# Patient Record
Sex: Male | Born: 1935 | Hispanic: No | Marital: Married | Smoking: Never smoker
Health system: Southern US, Community
[De-identification: ages and names within clinical notes are randomized; demographics above are authoritative.]

## PROBLEM LIST (undated history)

## (undated) DIAGNOSIS — G20A1 Parkinson's disease without dyskinesia, without mention of fluctuations: Secondary | ICD-10-CM

## (undated) DIAGNOSIS — R413 Other amnesia: Secondary | ICD-10-CM

## (undated) DIAGNOSIS — G20 Parkinson's disease: Secondary | ICD-10-CM

## (undated) DIAGNOSIS — E039 Hypothyroidism, unspecified: Secondary | ICD-10-CM

## (undated) DIAGNOSIS — G4752 REM sleep behavior disorder: Secondary | ICD-10-CM

## (undated) DIAGNOSIS — J189 Pneumonia, unspecified organism: Secondary | ICD-10-CM

## (undated) HISTORY — PX: TONSILLECTOMY: SUR1361

## (undated) HISTORY — DX: Parkinson's disease: G20

## (undated) HISTORY — DX: Other amnesia: R41.3

## (undated) HISTORY — PX: CATARACT EXTRACTION W/ INTRAOCULAR LENS  IMPLANT, BILATERAL: SHX1307

## (undated) HISTORY — DX: REM sleep behavior disorder: G47.52

## (undated) HISTORY — DX: Parkinson's disease without dyskinesia, without mention of fluctuations: G20.A1

## (undated) HISTORY — DX: Hypothyroidism, unspecified: E03.9

---

## 2011-05-28 ENCOUNTER — Other Ambulatory Visit: Payer: Self-pay | Admitting: Neurology

## 2011-05-28 DIAGNOSIS — R269 Unspecified abnormalities of gait and mobility: Secondary | ICD-10-CM

## 2011-05-28 DIAGNOSIS — G20 Parkinson's disease: Secondary | ICD-10-CM

## 2011-05-29 ENCOUNTER — Ambulatory Visit
Admission: RE | Admit: 2011-05-29 | Discharge: 2011-05-29 | Disposition: A | Discharge status: Home / Self Care | Payer: Medicare Other | Source: Ambulatory Visit | Attending: Neurology | Admitting: Neurology

## 2011-05-29 DIAGNOSIS — R269 Unspecified abnormalities of gait and mobility: Secondary | ICD-10-CM

## 2011-05-29 DIAGNOSIS — G20 Parkinson's disease: Secondary | ICD-10-CM

## 2011-06-25 ENCOUNTER — Ambulatory Visit: Payer: Medicare Other | Attending: Neurology | Admitting: Physical Therapy

## 2011-06-25 DIAGNOSIS — IMO0001 Reserved for inherently not codable concepts without codable children: Secondary | ICD-10-CM | POA: Insufficient documentation

## 2011-06-25 DIAGNOSIS — R269 Unspecified abnormalities of gait and mobility: Secondary | ICD-10-CM | POA: Insufficient documentation

## 2011-07-04 ENCOUNTER — Ambulatory Visit: Payer: Medicare Other | Attending: Neurology | Admitting: Physical Therapy

## 2011-07-04 DIAGNOSIS — R269 Unspecified abnormalities of gait and mobility: Secondary | ICD-10-CM | POA: Insufficient documentation

## 2011-07-04 DIAGNOSIS — IMO0001 Reserved for inherently not codable concepts without codable children: Secondary | ICD-10-CM | POA: Insufficient documentation

## 2011-07-10 ENCOUNTER — Ambulatory Visit: Payer: Medicare Other | Admitting: Physical Therapy

## 2011-07-17 ENCOUNTER — Ambulatory Visit: Payer: Medicare Other | Admitting: Physical Therapy

## 2011-07-24 ENCOUNTER — Ambulatory Visit: Payer: Medicare Other | Admitting: Physical Therapy

## 2011-07-30 ENCOUNTER — Ambulatory Visit: Payer: Medicare Other | Admitting: Physical Therapy

## 2011-08-13 ENCOUNTER — Ambulatory Visit: Payer: Medicare Other | Admitting: Physical Therapy

## 2011-08-20 ENCOUNTER — Ambulatory Visit: Payer: Medicare Other | Attending: Neurology | Admitting: Physical Therapy

## 2011-08-20 DIAGNOSIS — R269 Unspecified abnormalities of gait and mobility: Secondary | ICD-10-CM | POA: Insufficient documentation

## 2011-08-20 DIAGNOSIS — IMO0001 Reserved for inherently not codable concepts without codable children: Secondary | ICD-10-CM | POA: Insufficient documentation

## 2011-08-27 ENCOUNTER — Ambulatory Visit: Payer: Medicare Other | Admitting: Physical Therapy

## 2011-09-03 ENCOUNTER — Ambulatory Visit: Payer: Medicare Other | Attending: Neurology | Admitting: Physical Therapy

## 2011-09-03 DIAGNOSIS — IMO0001 Reserved for inherently not codable concepts without codable children: Secondary | ICD-10-CM | POA: Insufficient documentation

## 2011-09-03 DIAGNOSIS — R269 Unspecified abnormalities of gait and mobility: Secondary | ICD-10-CM | POA: Insufficient documentation

## 2012-11-10 DIAGNOSIS — G20 Parkinson's disease: Secondary | ICD-10-CM | POA: Insufficient documentation

## 2012-11-10 DIAGNOSIS — R269 Unspecified abnormalities of gait and mobility: Secondary | ICD-10-CM | POA: Insufficient documentation

## 2013-04-20 ENCOUNTER — Encounter: Payer: Self-pay | Admitting: Neurology

## 2013-04-20 ENCOUNTER — Ambulatory Visit (INDEPENDENT_AMBULATORY_CARE_PROVIDER_SITE_OTHER): Payer: Medicare Other | Admitting: Neurology

## 2013-04-20 VITALS — BP 114/70 | HR 68 | Wt 158.0 lb

## 2013-04-20 DIAGNOSIS — R269 Unspecified abnormalities of gait and mobility: Secondary | ICD-10-CM

## 2013-04-20 DIAGNOSIS — G20 Parkinson's disease: Secondary | ICD-10-CM

## 2013-04-20 MED ORDER — ROPINIROLE HCL 3 MG PO TABS: 3.0000 mg | ORAL_TABLET | Freq: Three times a day (TID) | ORAL | Status: DC

## 2013-04-20 NOTE — Progress Notes (Signed)
   Reason for visit: Parkinson's disease  Craig Giles is an 79 y.o. male  History of present illness:  Craig Giles is an 79 year old right-handed white male with a history of Parkinson's disease. The patient has done well on low-dose Requip, currently taking 3 mg 3 times daily. The patient is tolerating the medication, and he is not having excessive drowsiness, nausea, or confusion on the medication. The patient does report some mild word finding and naming deficits. The patient is remaining active, and he reports no significant balance issues or falls. The patient sleeps well at night without vivid dreams. The patient returns to the office for an evaluation.  Past Medical History  Diagnosis Date  . Parkinson disease   . Hypothyroidism     History reviewed. No pertinent past surgical history.  Family History  Problem Relation Age of Onset  . Diabetes Brother     Social history:  reports that he has never smoked. He does not have any smokeless tobacco history on file. He reports that he does not drink alcohol or use illicit drugs.  Allergies: No Known Allergies  Medications:  No current outpatient prescriptions on file prior to visit.   No current facility-administered medications on file prior to visit.    ROS:  Out of a complete 14 system review of symptoms, the patient complains only of the following symptoms, and all other reviewed systems are negative.  Mild gait instability Mild memory issues  Blood pressure 114/70, pulse 68, weight 158 lb (71.668 kg).  Physical Exam  General: The patient is alert and cooperative at the time of the examination.  Skin: No significant peripheral edema is noted.   Neurologic Exam  Mental status: Mini-Mental status examination done today shows a total score 24/30. The patient is able to name 9 animals in 60 seconds.  Cranial nerves: Facial symmetry is present. Speech is normal, no aphasia or dysarthria is noted.  Extraocular movements are full. Visual fields are full. Mild masking of the face is seen.  Motor: The patient has good strength in all 4 extremities.  Coordination: The patient has good finger-nose-finger and heel-to-shin bilaterally.  Gait and station: The patient has a slightly stooped posture. With ambulation, the patient has decreased arm swing with the right arm, and mild tremor with the right upper extremity. The patient has slight difficulty arising from a chair with arms crossed. Tandem gait is normal. Romberg is negative. No drift is seen.  Reflexes: Deep tendon reflexes are symmetric.   Assessment/Plan:  One. Parkinson's disease   The patient will be followed for the memory issues. The patient is doing well with his mobility, and he will be maintained on the current dose of Requip taking 3 mg 3 times daily. The patient will followup in about 5 or 6 months. The patient will contact me if he has any change in his functional level.  Marlan Palau MD 04/20/2013 1:41 PM  [facility] [address] [place], [place] [address]  Phone [phone] Fax [phone]

## 2013-07-14 ENCOUNTER — Other Ambulatory Visit: Payer: Self-pay

## 2013-07-14 MED ORDER — ROPINIROLE HCL 3 MG PO TABS: 3.0000 mg | ORAL_TABLET | Freq: Three times a day (TID) | ORAL | Status: DC

## 2013-10-13 ENCOUNTER — Other Ambulatory Visit: Payer: Self-pay | Admitting: Neurology

## 2013-10-22 ENCOUNTER — Encounter (INDEPENDENT_AMBULATORY_CARE_PROVIDER_SITE_OTHER): Payer: Self-pay

## 2013-10-22 ENCOUNTER — Ambulatory Visit (INDEPENDENT_AMBULATORY_CARE_PROVIDER_SITE_OTHER): Payer: Medicare Other | Admitting: Neurology

## 2013-10-22 ENCOUNTER — Encounter: Payer: Self-pay | Admitting: Neurology

## 2013-10-22 VITALS — BP 115/70 | HR 60 | Wt 165.0 lb

## 2013-10-22 DIAGNOSIS — R413 Other amnesia: Secondary | ICD-10-CM | POA: Insufficient documentation

## 2013-10-22 DIAGNOSIS — G20 Parkinson's disease: Secondary | ICD-10-CM

## 2013-10-22 DIAGNOSIS — R269 Unspecified abnormalities of gait and mobility: Secondary | ICD-10-CM

## 2013-10-22 HISTORY — DX: Other amnesia: R41.3

## 2013-10-22 MED ORDER — CARBIDOPA-LEVODOPA 25-100 MG PO TABS: ORAL_TABLET | ORAL | Status: DC

## 2013-10-22 NOTE — Patient Instructions (Signed)

## 2013-10-22 NOTE — Progress Notes (Signed)
Reason for visit: Parkinson's disease  Craig Giles is an 79 y.o. male  History of present illness:   Craig Giles is an 79 year old right-handed white male with a history of Parkinson's disease. The patient has been on low-dose Requip taking 3 mg 3 times daily. Since last seen, the patient has had a gradual worsening of his condition, with increased bradykinesia. The patient is freezing sometimes when he tries to initiate walking. The patient is having some issues with a mild memory disturbance. The patient wakes up at night to use the bathroom, and he oftentimes cannot get back to sleep. The patient does report vivid dreams at night, but the wife indicates that he does not act out his dreams. The patient is not extremely active during the day. The patient has difficulty getting up out of a chair or a couch. The patient denies any falls. The patient denies any problems with swallowing or chewing. The patient returns to this office for an evaluation.  Past Medical History  Diagnosis Date  . Parkinson disease   . Hypothyroidism   . Memory deficits 10/22/2013    History reviewed. No pertinent past surgical history.  Family History  Problem Relation Age of Onset  . Diabetes Brother     Social history:  reports that he has never smoked. He has never used smokeless tobacco. He reports that he does not drink alcohol or use illicit drugs.   No Known Allergies  Medications:  Current Outpatient Prescriptions on File Prior to Visit  Medication Sig Dispense Refill  . levothyroxine (SYNTHROID, LEVOTHROID) 88 MCG tablet Take 88 mcg by mouth daily.      . Multiple Vitamin (MULTIVITAMIN) tablet Take 1 tablet by mouth daily.      Marland Kitchen rOPINIRole (REQUIP) 3 MG tablet TAKE 1 TABLET 3 TIMES A DAY  270 tablet  3   No current facility-administered medications on file prior to visit.    ROS:  Out of a complete 14 system review of symptoms, the patient complains only of the following  symptoms, and all other reviewed systems are negative.  Memory disturbance Tremor Gait disturbance  Blood pressure 115/70, pulse 60, weight 165 lb (74.844 kg).  Physical Exam  General: The patient is alert and cooperative at the time of the examination.  Skin: 2-3+ edema below the knees is noted bilaterally.   Neurologic Exam  Mental status: The Mini-Mental status examination done today shows a total score of 28/30.  Cranial nerves: Facial symmetry is present. Speech is normal, no aphasia or dysarthria is noted. Extraocular movements are full. Visual fields are full.  Motor: The patient has good strength in all 4 extremities.  Sensory examination: Soft touch sensation is symmetric on the face, arms, and legs.  Coordination: The patient has good finger-nose-finger and heel-to-shin bilaterally.  Gait and station: The patient is able to arise from a seated position with arms crossed. Once up, the patient has a stooped posture, decreased arm swing, right greater than left. Some intermittent tremor with the right arm is noted at rest. Tandem gait is slightly unsteady. Romberg is negative. No drift is seen.  Reflexes: Deep tendon reflexes are symmetric.   Assessment/Plan:  One. Parkinson's disease  2. Memory disturbance  The patient is progressing some with the Parkinson's disease. The patient will be placed on low-dose Sinemet taking the 25/100 mg tablets, one half tablet 3 times daily for 3 weeks, and then go to one half tablet 3 times daily. If the patient  does well with this, he will call for a 90 day prescription. The patient will remain on Requip taking 3 mg 3 times daily. The patient will followup through this office in 5 months. The patient is to remain as active as possible, walking on a regular basis.  Marlan Palau MD 10/22/2013 8:09 PM  [facility] [address] [place], [place] [address]  Phone [phone] Fax [phone]

## 2014-04-27 ENCOUNTER — Ambulatory Visit (INDEPENDENT_AMBULATORY_CARE_PROVIDER_SITE_OTHER): Payer: Medicare Other | Admitting: Neurology

## 2014-04-27 ENCOUNTER — Encounter: Payer: Self-pay | Admitting: Neurology

## 2014-04-27 ENCOUNTER — Encounter (INDEPENDENT_AMBULATORY_CARE_PROVIDER_SITE_OTHER): Payer: Self-pay

## 2014-04-27 VITALS — BP 93/61 | HR 56 | Wt 169.0 lb

## 2014-04-27 DIAGNOSIS — R413 Other amnesia: Secondary | ICD-10-CM

## 2014-04-27 DIAGNOSIS — G20 Parkinson's disease: Secondary | ICD-10-CM

## 2014-04-27 DIAGNOSIS — G20A1 Parkinson's disease without dyskinesia, without mention of fluctuations: Secondary | ICD-10-CM

## 2014-04-27 DIAGNOSIS — R269 Unspecified abnormalities of gait and mobility: Secondary | ICD-10-CM

## 2014-04-27 MED ORDER — CARBIDOPA-LEVODOPA 25-100 MG PO TABS: 1.0000 | ORAL_TABLET | Freq: Three times a day (TID) | ORAL | Status: DC

## 2014-04-27 NOTE — Patient Instructions (Signed)

## 2014-04-27 NOTE — Progress Notes (Signed)
Reason for visit: Parkinson's disease  Craig Giles is an 80 y.o. male  History of present illness:  Craig Giles is an 80 year old right-handed white male with a history of Parkinson's disease. The patient has been placed on Sinemet taking the 25/100 mg tablets 3 times daily. The patient is on Requip taking 3 mg 3 times daily. The patient denies any drowsiness or confusion on the medication. The patient indicates that he feels that he got no therapy benefit from this medication. He does have some hesitancy when initiating ambulation, but he denies any gait instability or falls. The patient denies any problems choking. He averages about 4 hours of sleep at night, but he indicates that this is enough not sleep for him, he does not have excessive fatigue or drowsiness during the day.  Past Medical History  Diagnosis Date  . Parkinson disease   . Hypothyroidism   . Memory deficits 10/22/2013    History reviewed. No pertinent past surgical history.  Family History  Problem Relation Age of Onset  . Diabetes Brother     Social history:  reports that he has never smoked. He has never used smokeless tobacco. He reports that he does not drink alcohol or use illicit drugs.   No Known Allergies  Medications:  Current Outpatient Prescriptions on File Prior to Visit  Medication Sig Dispense Refill  . furosemide (LASIX) 20 MG tablet Take 20 mg by mouth daily. For swelling      . levothyroxine (SYNTHROID, LEVOTHROID) 88 MCG tablet Take 88 mcg by mouth daily.      . Multiple Vitamin (MULTIVITAMIN) tablet Take 1 tablet by mouth daily.      Marland Kitchen rOPINIRole (REQUIP) 3 MG tablet TAKE 1 TABLET 3 TIMES A DAY  270 tablet  3   No current facility-administered medications on file prior to visit.    ROS:  Out of a complete 14 system review of symptoms, the patient complains only of the following symptoms, and all other reviewed systems are negative.  Slowness of activities  Blood pressure  93/61, pulse 56, weight 169 lb (76.658 kg).  Physical Exam  General: The patient is alert and cooperative at the time of the examination.  Skin: 2+ edema below the knees is noted bilaterally.   Neurologic Exam  Mental status: The patient is oriented x 3. Mini-Mental status examination done today shows a total score of 30/30.  Cranial nerves: Facial symmetry is present. Speech is normal, no aphasia or dysarthria is noted. Extraocular movements are full. Visual fields are full.  Motor: The patient has good strength in all 4 extremities.  Sensory examination: Soft touch sensation is symmetric on the face, arms, and legs.  Coordination: The patient has good finger-nose-finger and heel-to-shin bilaterally.  Gait and station: The patient is able to arise from a seated position with arms crossed. Once up, the patient and is without assistance, has good arm swing, slightly stooped posture. The patient has some shuffling with turns. Tandem gait is slightly unsteady. Romberg is negative. No drift is seen.  Reflexes: Deep tendon reflexes are symmetric.   Assessment/Plan:  One. Parkinson's disease  The patient is doing relatively well at this point, but he does have some hesitancy with turns and with initiation of ambulation. The patient will go up on the Sinemet taking the 25/100 mg tablets 3 times daily. The patient will followup through this office in about 5 months.  Marlan Palau MD 04/27/2014 5:02 PM  [facility]  [address] [place], [place] [address]  Phone [phone] Fax [phone]

## 2014-10-03 ENCOUNTER — Encounter: Payer: Self-pay | Admitting: Neurology

## 2014-10-03 ENCOUNTER — Ambulatory Visit (INDEPENDENT_AMBULATORY_CARE_PROVIDER_SITE_OTHER): Payer: Medicare Other | Admitting: Neurology

## 2014-10-03 VITALS — BP 117/73 | HR 60 | Ht 62.0 in | Wt 169.4 lb

## 2014-10-03 DIAGNOSIS — G20 Parkinson's disease: Secondary | ICD-10-CM

## 2014-10-03 DIAGNOSIS — R413 Other amnesia: Secondary | ICD-10-CM

## 2014-10-03 NOTE — Patient Instructions (Signed)
Parkinson Disease Parkinson disease is a disorder of the central nervous system, which includes the brain and spinal cord. A person with this disease slowly loses the ability to completely control body movements. Within the brain, there is a group of nerve cells (basal ganglia) that help control movement. The basal ganglia are damaged and do not work properly in a person with Parkinson disease. In addition, the basal ganglia produce and use a brain chemical called dopamine. The dopamine chemical sends messages to other parts of the body to control and coordinate body movements. Dopamine levels are low in a person with Parkinson disease. If the dopamine levels are low, then the body does not receive the correct messages it needs to move normally.  CAUSES  The exact reason why the basal ganglia get damaged is not known. Some medical researchers have thought that infection, genes, environment, and certain medicines may contribute to the cause.  SYMPTOMS   An early symptom of Parkinson disease is often an uncontrolled shaking (tremor) of the hands. The tremor will often disappear when the affected hand is consciously used.  As the disease progresses, walking, talking, getting out of a chair, and new movements become more difficult.  Muscles get stiff and movements become slower.  Balance and coordination become harder.  Depression, trouble swallowing, urinary problems, constipation, and sleep problems can occur.  Later in the disease, memory and thought processes may deteriorate. DIAGNOSIS  There are no specific tests to diagnose Parkinson disease. You may be referred to a neurologist for evaluation. Your caregiver will ask about your medical history, symptoms, and perform a physical exam. Blood tests and imaging tests of your brain may be performed to rule out other diseases. The imaging tests may include an MRI or a CT scan. TREATMENT  The goal of treatment is to relieve symptoms. Medicines may be  prescribed once the symptoms become troublesome. Medicine will not stop the progression of the disease, but medicine can make movement and balance better and help control tremors. Speech and occupational therapy may also be prescribed. Sometimes, surgical treatment of the brain can be done in young people. HOME CARE INSTRUCTIONS  Get regular exercise and rest periods during the day to help prevent exhaustion and depression.  If getting dressed becomes difficult, replace buttons and zippers with Velcro and elastic on your clothing.  Take all medicine as directed by your caregiver.  Install grab bars or railings in your home to prevent falls.  Go to speech or occupational therapy as directed.  Keep all follow-up visits as directed by your caregiver. SEEK MEDICAL CARE IF:  Your symptoms are not controlled with your medicine.  You fall.  You have trouble swallowing or choke on your food. MAKE SURE YOU:  Understand these instructions.  Will watch your condition.  Will get help right away if you are not doing well or get worse. Document Released: 11/15/2000 Document Revised: 03/15/2013 Document Reviewed: 12/18/2011 Exit[employer] Patient Information 2015 [employer]. This information is not intended to replace advice given to you by your health care provider. Make sure you discuss any questions you have with your health care provider.  

## 2014-10-03 NOTE — Progress Notes (Addendum)
Reason for visit: Parkinson's disease  Craig Giles is an 80 y.o. male  History of present illness:  [name] is an 80 year old right-handed white male with a history of Parkinson's disease and a mild memory disturbance. The patient has done very well since last seen, he continues to remain active, and he has not had any decline in his ambulatory ability since last seen. The patient denies any falls, he denies any tremor, and he denies any problems with swallowing. The patient indicates that he only will sleep well at night, but he oftentimes has to get up to use the bathroom at one point, and he has difficulty falling back asleep commonly. The patient is on Sinemet taking the 25/100 mg tablets 3 times daily, and he is tolerating the medication well. He returns to the office for an evaluation.  Past Medical History  Diagnosis Date  . Parkinson disease   . Hypothyroidism   . Memory deficits 10/22/2013    History reviewed. No pertinent past surgical history.  Family History  Problem Relation Age of Onset  . Diabetes Brother     Social history:  reports that he has never smoked. He has never used smokeless tobacco. He reports that he does not drink alcohol or use illicit drugs.   No Known Allergies  Medications:  Current Outpatient Prescriptions on File Prior to Visit  Medication Sig Dispense Refill  . carbidopa-levodopa (SINEMET IR) 25-100 MG per tablet Take 1 tablet by mouth 3 (three) times daily. 270 tablet 3  . levothyroxine (SYNTHROID, LEVOTHROID) 88 MCG tablet Take 88 mcg by mouth daily.    . Multiple Vitamin (MULTIVITAMIN) tablet Take 1 tablet by mouth daily.    Marland Kitchen. rOPINIRole (REQUIP) 3 MG tablet TAKE 1 TABLET 3 TIMES A DAY 270 tablet 3   No current facility-administered medications on file prior to visit.    ROS:  Out of a complete 14 system review of symptoms, the patient complains only of the following symptoms, and all other reviewed systems are  negative.  Insomnia   Blood pressure 117/73, pulse 60, height 5\' 2"  (1.575 m), weight 169 lb 6.4 oz (76.839 kg).  Physical Exam  General: The patient is alert and cooperative at the time of the examination.  Neuromuscular: The patient's posture is somewhat stooped.  Skin: 1+ edema is noted in both legs below the knees.   Neurologic Exam  Mental status: The Mini-Mental status examination done today shows a total score of 28/30.  Cranial nerves: Facial symmetry is present. Speech is normal, no aphasia or dysarthria is noted. Extraocular movements are full. Visual fields are full.  Motor: The patient has good strength in all 4 extremities.  Sensory examination: Soft touch sensation is symmetric on the face, arms, and legs.  Coordination: The patient has good finger-nose-finger and heel-to-shin bilaterally.  Gait and station: The patient is able to arise from a seated position with arms crossed. Once up, the patient has good stride, returns.. Tandem gait is slightly unsteady. Romberg is negative. No drift is seen.  Reflexes: Deep tendon reflexes are symmetric.   Assessment/Plan:  1. Parkinson's disease  2. Mild memory disturbance  The patient is overall doing very well, he is staying active, and he is maintaining his functional ability quite well. The patient will need be followed for the memory issue, and medications for memory may be added in the near future. He will follow-up in about 5 months.  Craig Giles. Keith Neiva Maenza MD 10/03/2014 12:05 PM  [facility] [address] [place], [place] [address]  Phone [phone] Fax [phone]

## 2014-10-07 ENCOUNTER — Other Ambulatory Visit: Payer: Self-pay | Admitting: Neurology

## 2014-10-19 ENCOUNTER — Encounter: Payer: Self-pay | Admitting: Neurology

## 2014-10-25 ENCOUNTER — Encounter: Payer: Self-pay | Admitting: Neurology

## 2015-03-06 ENCOUNTER — Ambulatory Visit (INDEPENDENT_AMBULATORY_CARE_PROVIDER_SITE_OTHER): Payer: Medicare Other | Admitting: Neurology

## 2015-03-06 ENCOUNTER — Encounter: Payer: Self-pay | Admitting: Neurology

## 2015-03-06 VITALS — BP 104/66 | HR 56 | Ht 66.0 in | Wt 169.0 lb

## 2015-03-06 DIAGNOSIS — R413 Other amnesia: Secondary | ICD-10-CM

## 2015-03-06 DIAGNOSIS — G20 Parkinson's disease: Secondary | ICD-10-CM

## 2015-03-06 DIAGNOSIS — R269 Unspecified abnormalities of gait and mobility: Secondary | ICD-10-CM

## 2015-03-06 MED ORDER — CARBIDOPA-LEVODOPA 25-100 MG PO TABS: 1.0000 | ORAL_TABLET | Freq: Three times a day (TID) | ORAL | Status: DC

## 2015-03-06 NOTE — Progress Notes (Signed)
Reason for visit: Parkinson's disease  Craig Giles is an 81 y.o. male  History of present illness:  [name] is an 81 year old right-handed white male with a history of Parkinson's disease. The patient was initially seen in June 2012 with a resting tremor affecting the right upper extremity, and masking of the face. The patient has been placed on low-dose Requip and he is on Sinemet. The patient believes that these medications helped his symptoms. He has not had any tremor. He continues to be active, he has not had any problems with walking or getting around. He denies any falls, he denies problems with speech or swallowing. He has not had any significant changes in his physical abilities since last seen, and he is doing well cognitively. He returns for an evaluation. He operates motor vehicle without difficulty.  Past Medical History  Diagnosis Date  . Parkinson disease   . Hypothyroidism   . Memory deficits 10/22/2013    Past Surgical History  Procedure Laterality Date  . Tonsillectomy      Family History  Problem Relation Age of Onset  . Diabetes Brother     Social history:  reports that he has never smoked. He has never used smokeless tobacco. He reports that he does not drink alcohol or use illicit drugs.   No Known Allergies  Medications:  Prior to Admission medications   Medication Sig Start Date End Date Taking? Authorizing Provider  carbidopa-levodopa (SINEMET IR) 25-100 MG per tablet Take 1 tablet by mouth 3 (three) times daily. 04/27/14  Yes York Spanielharles K Kipton Skillen, MD  levothyroxine (SYNTHROID, LEVOTHROID) 88 MCG tablet Take 88 mcg by mouth daily. 03/17/13  Yes Historical Provider, MD  Multiple Vitamin (MULTIVITAMIN) tablet Take 1 tablet by mouth daily.   Yes Historical Provider, MD  rOPINIRole (REQUIP) 3 MG tablet TAKE 1 TABLET BY MOUTH THREE TIMES DAILY 10/07/14  Yes York Spanielharles K Apollos Tenbrink, MD    ROS:  Out of a complete 14 system review of symptoms, the patient  complains only of the following symptoms, and all other reviewed systems are negative.  Mild memory disorder  Blood pressure 104/66, pulse 56, height 5\' 6"  (1.676 m), weight 169 lb (76.658 kg).  Physical Exam  General: The patient is alert and cooperative at the time of the examination.  Skin: 1+ edema below the knees is noted bilaterally.   Neurologic Exam  Mental status: The patient is alert and oriented x 3 at the time of the examination. The patient has apparent normal recent and remote memory, with an apparently normal attention span and concentration ability. Mini-Mental Status Examination done today shows a total score 30/30.   Cranial nerves: Facial symmetry is present. Speech is normal, no aphasia or dysarthria is noted. Extraocular movements are full. Visual fields are full.  Motor: The patient has good strength in all 4 extremities.  Sensory examination: Soft touch sensation is symmetric on the face, arms, and legs.  Coordination: The patient has good finger-nose-finger and heel-to-shin bilaterally.  Gait and station: The patient has a normal gait. The patient has good arm swing, kyphotic posture. The patient is able to arise from a seated position with arms crossed. Tandem gait is normal. Romberg is negative. No drift is seen.  Reflexes: Deep tendon reflexes are symmetric.   Assessment/Plan:  1. Parkinson's disease  2. Mild memory disorder  The patient is doing quite well currently. We will continue the medications for now, a prescription was called in for Sinemet. He will  follow-up in 6 months.  Marlan Palau MD 03/06/2015 7:30 PM  [facility] [address] [place], [place] [address]  Phone [phone] Fax [phone]

## 2015-04-25 ENCOUNTER — Other Ambulatory Visit: Payer: Self-pay | Admitting: Neurology

## 2015-09-11 ENCOUNTER — Ambulatory Visit (INDEPENDENT_AMBULATORY_CARE_PROVIDER_SITE_OTHER): Payer: Medicare Other | Admitting: Neurology

## 2015-09-11 ENCOUNTER — Encounter: Payer: Self-pay | Admitting: Neurology

## 2015-09-11 VITALS — BP 125/76 | HR 56 | Ht 65.0 in | Wt 164.0 lb

## 2015-09-11 DIAGNOSIS — R413 Other amnesia: Secondary | ICD-10-CM | POA: Diagnosis not present

## 2015-09-11 DIAGNOSIS — R269 Unspecified abnormalities of gait and mobility: Secondary | ICD-10-CM

## 2015-09-11 DIAGNOSIS — G20 Parkinson's disease: Secondary | ICD-10-CM

## 2015-09-11 MED ORDER — ROPINIROLE HCL 3 MG PO TABS: 3.0000 mg | ORAL_TABLET | Freq: Three times a day (TID) | ORAL | Status: DC

## 2015-09-11 NOTE — Patient Instructions (Signed)
Parkinson Disease Parkinson disease is a disorder of the central nervous system, which includes the brain and spinal cord. A person with this disease slowly loses the ability to completely control body movements. Within the brain, there is a group of nerve cells (basal ganglia) that help control movement. The basal ganglia are damaged and do not work properly in a person with Parkinson disease. In addition, the basal ganglia produce and use a brain chemical called dopamine. The dopamine chemical sends messages to other parts of the body to control and coordinate body movements. Dopamine levels are low in a person with Parkinson disease. If the dopamine levels are low, then the body does not receive the correct messages it needs to move normally.  CAUSES  The exact reason why the basal ganglia get damaged is not known. Some medical researchers have thought that infection, genes, environment, and certain medicines may contribute to the cause.  SYMPTOMS   An early symptom of Parkinson disease is often an uncontrolled shaking (tremor) of the hands. The tremor will often disappear when the affected hand is consciously used.  As the disease progresses, walking, talking, getting out of a chair, and new movements become more difficult.  Muscles get stiff and movements become slower.  Balance and coordination become harder.  Depression, trouble swallowing, urinary problems, constipation, and sleep problems can occur.  Later in the disease, memory and thought processes may deteriorate. DIAGNOSIS  There are no specific tests to diagnose Parkinson disease. You may be referred to a neurologist for evaluation. Your caregiver will ask about your medical history, symptoms, and perform a physical exam. Blood tests and imaging tests of your brain may be performed to rule out other diseases. The imaging tests may include an MRI or a CT scan. TREATMENT  The goal of treatment is to relieve symptoms. Medicines may be  prescribed once the symptoms become troublesome. Medicine will not stop the progression of the disease, but medicine can make movement and balance better and help control tremors. Speech and occupational therapy may also be prescribed. Sometimes, surgical treatment of the brain can be done in young people. HOME CARE INSTRUCTIONS  Get regular exercise and rest periods during the day to help prevent exhaustion and depression.  If getting dressed becomes difficult, replace buttons and zippers with Velcro and elastic on your clothing.  Take all medicine as directed by your caregiver.  Install grab bars or railings in your home to prevent falls.  Go to speech or occupational therapy as directed.  Keep all follow-up visits as directed by your caregiver. SEEK MEDICAL CARE IF:  Your symptoms are not controlled with your medicine.  You fall.  You have trouble swallowing or choke on your food. MAKE SURE YOU:  Understand these instructions.  Will watch your condition.  Will get help right away if you are not doing well or get worse.   This information is not intended to replace advice given to you by your health care provider. Make sure you discuss any questions you have with your health care provider.   Document Released: 11/15/2000 Document Revised: 03/15/2013 Document Reviewed: 12/18/2011 Elsevier Interactive Patient Education 2016 Elsevier Inc.  

## 2015-09-11 NOTE — Progress Notes (Signed)
Reason for visit: Parkinson's disease  Craig Giles is an 81 y.o. male  History of present illness:  Craig Giles is an 81 year old right-handed white male with a history of Parkinson's disease. The patient has had no significant changes in his mobility since last seen, he will note on occasion that he will have some hesitancy with initiation of ambulation first thing in the morning. The patient has not had any falls because of this. He remains active. He denies any significant issues with memory, he will occasionally have some difficulty remembering names for people. He is on Sinemet and Requip. He indicates that he gets to sleep fairly well, but he oftentimes will wake up early in the morning, and have difficulty getting back to sleep. He will need to read in order to get back to sleep. He returns for an evaluation.  Past Medical History  Diagnosis Date  . Parkinson disease (HCC)   . Hypothyroidism   . Memory deficits 10/22/2013    Past Surgical History  Procedure Laterality Date  . Tonsillectomy      Family History  Problem Relation Age of Onset  . Diabetes Brother     Social history:  reports that he has never smoked. He has never used smokeless tobacco. He reports that he does not drink alcohol or use illicit drugs.   No Known Allergies  Medications:  Prior to Admission medications   Medication Sig Start Date End Date Taking? Authorizing Provider  levothyroxine (SYNTHROID, LEVOTHROID) 88 MCG tablet Take 88 mcg by mouth daily. 03/17/13  Yes Historical Provider, MD  Multiple Vitamin (MULTIVITAMIN) tablet Take 1 tablet by mouth daily.   Yes Historical Provider, MD  rOPINIRole (REQUIP) 3 MG tablet TAKE 1 TABLET BY MOUTH THREE TIMES DAILY 10/07/14  Yes York Spaniel, MD  carbidopa-levodopa (SINEMET IR) 25-100 MG per tablet Take 1 tablet by mouth 3 (three) times daily. Patient not taking: Reported on 09/11/2015 03/06/15   York Spaniel, MD    ROS:  Out of a  complete 14 system review of symptoms, the patient complains only of the following symptoms, and all other reviewed systems are negative.  Insomnia  Blood pressure 125/76, pulse 56, height  (1.651 m), weight 164 lb (74.39 kg).  Physical Exam  General: The patient is alert and cooperative at the time of the examination. Posture is somewhat stooped.  Skin: 1+ edema to ankles is noted bilaterally. The patient has varicose veins on the left greater than right lower extremity.   Neurologic Exam  Mental status: The patient is alert and oriented x 3 at the time of the examination. The patient has apparent normal recent and remote memory, with an apparently normal attention span and concentration ability. Mini-Mental Status Examination done today shows a total score of 30/30. The patient is able to name 8 animals in 30 seconds.   Cranial nerves: Facial symmetry is present. Speech is normal, no aphasia or dysarthria is noted. Extraocular movements are full. Visual fields are full.  Motor: The patient has good strength in all 4 extremities.  Sensory examination: Soft touch sensation is symmetric on the face, arms, and legs.  Coordination: The patient has good finger-nose-finger and heel-to-shin bilaterally.  Gait and station: The patient is able to rise from a seated position with arms crossed. Once up, the patient is able to ambulate independently, good arm swing and good turns. Tandem gait is slightly unsteady. Romberg is negative. No drift is seen.  Reflexes: Deep tendon  reflexes are symmetric.   Assessment/Plan:  1. Parkinson's disease  2. Mild memory disturbance  The patient is doing quite well at this time. He is to remain active. We will continue to follow the memory. He will remain on the low-dose Sinemet and Requip at the current dose, a prescription was given for the Requip. He will follow-up in 6 months.  Marlan Palau MD 09/11/2015 9:40 AM  [facility] [address] [place], [place] [address]  Phone [phone] Fax [phone]

## 2015-10-09 ENCOUNTER — Telehealth: Payer: Self-pay | Admitting: Neurology

## 2015-10-09 NOTE — Telephone Encounter (Signed)
Craig Giles/Dr Mardelle MatteAndy [phone] called to schedule appointment within 1-2 weeks for delirium, progressive Parkinson's, poor sleep.

## 2015-10-10 NOTE — Telephone Encounter (Signed)
Returned call. Office phone number busy.

## 2015-10-10 NOTE — Telephone Encounter (Signed)
Per Tad Mooreasandra -ok to schedule pt

## 2015-10-10 NOTE — Telephone Encounter (Signed)
Thanks

## 2015-10-17 ENCOUNTER — Ambulatory Visit: Payer: Medicare Other | Admitting: Neurology

## 2015-12-15 ENCOUNTER — Telehealth: Payer: Self-pay | Admitting: Neurology

## 2015-12-15 NOTE — Telephone Encounter (Signed)
I called patient. The patient has had a sudden decline in his ability to function, he is freezing. He indicates that he has not been sick, his tablets have not changed in color or shape. I will try to work in revisit, he will go up on the Sinemet taking 1.5 tablets 3 times daily.

## 2015-12-15 NOTE — Telephone Encounter (Signed)
Pt called said he is suddenly doing very badly. He gets stuck walking, afraid of falling but is moving slowly and being very careful. He said this has been going on for the past 5 or 6 days.

## 2015-12-15 NOTE — Telephone Encounter (Signed)
I called the patient. He is still taking his Sinemet and Requip as prescribed. I advised that I would let [name] know about his new symptoms.

## 2015-12-18 NOTE — Telephone Encounter (Signed)
Spoke to patient's wife. Appointment scheduled 1/19.

## 2015-12-21 ENCOUNTER — Encounter: Payer: Self-pay | Admitting: Neurology

## 2015-12-21 ENCOUNTER — Ambulatory Visit (INDEPENDENT_AMBULATORY_CARE_PROVIDER_SITE_OTHER): Payer: Medicare Other | Admitting: Neurology

## 2015-12-21 VITALS — BP 99/52 | HR 55 | Ht 65.0 in | Wt 164.0 lb

## 2015-12-21 DIAGNOSIS — R413 Other amnesia: Secondary | ICD-10-CM

## 2015-12-21 DIAGNOSIS — R269 Unspecified abnormalities of gait and mobility: Secondary | ICD-10-CM

## 2015-12-21 DIAGNOSIS — G20 Parkinson's disease: Secondary | ICD-10-CM

## 2015-12-21 DIAGNOSIS — G4752 REM sleep behavior disorder: Secondary | ICD-10-CM | POA: Diagnosis not present

## 2015-12-21 HISTORY — DX: REM sleep behavior disorder: G47.52

## 2015-12-21 MED ORDER — ROPINIROLE HCL 4 MG PO TABS: 4.0000 mg | ORAL_TABLET | Freq: Three times a day (TID) | ORAL | Status: DC

## 2015-12-21 NOTE — Patient Instructions (Addendum)
We will go up on the ropinerole to 4 mg three times a day.  Fall Prevention in the Home  Falls can cause injuries and can affect people from all age groups. There are many simple things that you can do to make your home safe and to help prevent falls. WHAT CAN I DO ON THE OUTSIDE OF MY HOME?  Regularly repair the edges of walkways and driveways and fix any cracks.  Remove high doorway thresholds.  Trim any shrubbery on the main path into your home.  Use bright outdoor lighting.  Clear walkways of debris and clutter, including tools and rocks.  Regularly check that handrails are securely fastened and in good repair. Both sides of any steps should have handrails.  Install guardrails along the edges of any raised decks or porches.  Have leaves, snow, and ice cleared regularly.  Use sand or salt on walkways during winter months.  In the garage, clean up any spills right away, including grease or oil spills. WHAT CAN I DO IN THE BATHROOM?  Use night lights.  Install grab bars by the toilet and in the tub and shower. Do not use towel bars as grab bars.  Use non-skid mats or decals on the floor of the tub or shower.  If you need to sit down while you are in the shower, use a plastic, non-slip stool.Marland Kitchen  Keep the floor dry. Immediately clean up any water that spills on the floor.  Remove soap buildup in the tub or shower on a regular basis.  Attach bath mats securely with double-sided non-slip rug tape.  Remove throw rugs and other tripping hazards from the floor. WHAT CAN I DO IN THE BEDROOM?  Use night lights.  Make sure that a bedside light is easy to reach.  Do not use oversized bedding that drapes onto the floor.  Have a firm chair that has side arms to use for getting dressed.  Remove throw rugs and other tripping hazards from the floor. WHAT CAN I DO IN THE KITCHEN?   Clean up any spills right away.  Avoid walking on wet floors.  Place frequently used items  in easy-to-reach places.  If you need to reach for something above you, use a sturdy step stool that has a grab bar.  Keep electrical cables out of the way.  Do not use floor polish or wax that makes floors slippery. If you have to use wax, make sure that it is non-skid floor wax.  Remove throw rugs and other tripping hazards from the floor. WHAT CAN I DO IN THE STAIRWAYS?  Do not leave any items on the stairs.  Make sure that there are handrails on both sides of the stairs. Fix handrails that are broken or loose. Make sure that handrails are as long as the stairways.  Check any carpeting to make sure that it is firmly attached to the stairs. Fix any carpet that is loose or worn.  Avoid having throw rugs at the top or bottom of stairways, or secure the rugs with carpet tape to prevent them from moving.  Make sure that you have a light switch at the top of the stairs and the bottom of the stairs. If you do not have them, have them installed. WHAT ARE SOME OTHER FALL PREVENTION TIPS?  Wear closed-toe shoes that fit well and support your feet. Wear shoes that have rubber soles or low heels.  When you use a stepladder, make sure that it is  completely opened and that the sides are firmly locked. Have someone hold the ladder while you are using it. Do not climb a closed stepladder.  Add color or contrast paint or tape to grab bars and handrails in your home. Place contrasting color strips on the first and last steps.  Use mobility aids as needed, such as canes, walkers, scooters, and crutches.  Turn on lights if it is dark. Replace any light bulbs that burn out.  Set up furniture so that there are clear paths. Keep the furniture in the same spot.  Fix any uneven floor surfaces.  Choose a carpet design that does not hide the edge of steps of a stairway.  Be aware of any and all pets.  Review your medicines with your healthcare provider. Some medicines can cause dizziness or changes  in blood pressure, which increase your risk of falling. Talk with your health care provider about other ways that you can decrease your risk of falls. This may include working with a physical therapist or trainer to improve your strength, balance, and endurance.   This information is not intended to replace advice given to you by your health care provider. Make sure you discuss any questions you have with your health care provider.   Document Released: 11/08/2002 Document Revised: 04/04/2015 Document Reviewed: 12/23/2014 Elsevier Interactive Patient Education [employer]

## 2015-12-21 NOTE — Progress Notes (Signed)
Reason for visit:  Parkinson's disease  Craig Giles is an 82 y.o. male  History of present illness:   Craig Giles is an 82 year old right-handed white male with a history of Parkinson's disease. Within the last 2 or 3 weeks, he has noted a change in his ability to ambulate. He has had a relatively sudden decline in his gait, freezing up frequently. He has stumbles, but no falls. He denies any change in color of any of his tablets. The patient is on Requip taking 3 mg 3 times daily, and Sinemet 25/100 mg tablets 3 times daily. The patient is told to go up on his Sinemet taking 1.5 tablets 3 times daily, in reality what he did was went up on the Requip taking 1.5 tablets 3 times daily. However, the patient seemed to gain benefit with this, he is walking better, moving better. He does report some issues at nighttime with vivid dreams, and with acting out dreams. He occasionally may fall out of bed. The patient returns to the office today for an evaluation. He denies any significant memory issues at this time.  Past Medical History  Diagnosis Date  . Parkinson disease (HCC)   . Hypothyroidism   . Memory deficits 10/22/2013  . 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 Tonsillectomy      Family History  Problem Relation Age of Onset  . Diabetes Brother     Social history:  reports that he has never smoked. He has never used smokeless tobacco. He reports that he does not drink alcohol or use illicit drugs.   No Known Allergies  Medications:  Prior to Admission medications   Medication Sig Start Date End Date Taking? Authorizing Provider  carbidopa-levodopa (SINEMET IR) 25-100 MG per tablet Take 1 tablet by mouth 3 (three) times daily. 03/06/15  Yes York Spaniel, MD  levothyroxine (SYNTHROID, LEVOTHROID) 88 MCG tablet Take 88 mcg by mouth daily. 03/17/13  Yes Historical Provider, MD  Multiple Vitamin (MULTIVITAMIN) tablet  Take 1 tablet by mouth daily.   Yes Historical Provider, MD  rOPINIRole (REQUIP) 3 MG tablet Take 1 tablet (3 mg total) by mouth 3 (three) times daily. 09/11/15  Yes York Spaniel, MD    ROS:  Out of a complete 14 system review of symptoms, the patient complains only of the following symptoms, and all other reviewed systems are negative.   Gait disorder  Vivid dreams  Blood pressure 99/52, pulse 55, height  (1.651 m), weight 164 lb (74.39 kg).  Physical Exam  General: The patient is alert and cooperative at the time of the examination.  Skin: No significant peripheral edema is noted.   Neurologic Exam  Mental status: The patient is alert and oriented x 3 at the time of the examination. The patient has apparent normal recent and remote memory, with an apparently normal attention span and concentration ability. Mini-Mental status examination done today shows a total score of 30/30. The patient is able to name 11 animals in 30 seconds.   Cranial nerves: Facial symmetry is present. Speech is normal, no aphasia or dysarthria is noted. Extraocular movements are full. Visual fields are full. Mild masking of the face is seen.  Motor: The patient has good strength in all 4 extremities.  Sensory examination: Soft touch sensation is symmetric on the face, arms, and legs.  Coordination: The patient has good finger-nose-finger and heel-to-shin bilaterally.  Gait and station:  The patient is able to arise from a seated position with arms crossed. Once up, he has good stride, arm swing is good. He has good turns. The patient has slight instability with tandem gait. Romberg is negative. Mild dyskinesias are seen.  Reflexes: Deep tendon reflexes are symmetric.   Assessment/Plan:   1. Parkinson's disease   2. Gait disorder   3. REM sleep disorder   The patient has gained improvement with going up on the Requip. I will write the prescription for a 4 mg tablet taking 1 tablet 3 times  daily. The patient will remain on the Sinemet 25/100 mg tablets 3 day. He will follow-up in April for his next scheduled appointment.  Marlan Palau MD 12/21/2015 8:24 PM  [facility] [address] [place], [place] [address]  Phone [phone] Fax [phone]

## 2016-03-11 ENCOUNTER — Encounter: Payer: Self-pay | Admitting: Neurology

## 2016-03-11 ENCOUNTER — Ambulatory Visit (INDEPENDENT_AMBULATORY_CARE_PROVIDER_SITE_OTHER): Payer: Medicare Other | Admitting: Neurology

## 2016-03-11 VITALS — Ht 65.0 in | Wt 155.5 lb

## 2016-03-11 DIAGNOSIS — R269 Unspecified abnormalities of gait and mobility: Secondary | ICD-10-CM

## 2016-03-11 DIAGNOSIS — G20 Parkinson's disease: Secondary | ICD-10-CM | POA: Diagnosis not present

## 2016-03-11 DIAGNOSIS — R413 Other amnesia: Secondary | ICD-10-CM | POA: Diagnosis not present

## 2016-03-11 DIAGNOSIS — G4752 REM sleep behavior disorder: Secondary | ICD-10-CM

## 2016-03-11 MED ORDER — ROPINIROLE HCL 5 MG PO TABS: 5.0000 mg | ORAL_TABLET | Freq: Three times a day (TID) | ORAL | Status: DC

## 2016-03-11 NOTE — Progress Notes (Signed)
Reason for visit: Parkinson's disease  Craig Giles is an 82 y.o. male  History of present illness:  Craig Giles is an 82 year old right-handed white male with a history of Parkinson's disease. The patient is on Sinemet 25/100 mg tablets 3 times daily, he is on Requip 4 mg 3 times daily. He has noted over the last 2 months that he is having more episodes of freezing that may occur when he is initiating ambulation, sometimes occurring while walking. The patient is afraid that he might fall, but he has not fallen yet. The patient is sleeping fairly well, but he will sometimes wake up at night, and have difficulty getting back to sleep. The patient is having vivid dreams. He does have a mild memory disturbance, but he believes that this is stable. He does not use a cane for ambulation. He comes back to the office for an evaluation.  Past Medical History  Diagnosis Date  . Parkinson disease (HCC)   . Hypothyroidism   . Memory deficits 10/22/2013  . 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 Tonsillectomy      Family History  Problem Relation Age of Onset  . Diabetes Brother     Social history:  reports that he has never smoked. He has never used smokeless tobacco. He reports that he does not drink alcohol or use illicit drugs.   No Known Allergies  Medications:  Prior to Admission medications   Medication Sig Start Date End Date Taking? Authorizing Provider  carbidopa-levodopa (SINEMET IR) 25-100 MG per tablet Take 1 tablet by mouth 3 (three) times daily. 03/06/15   York Spaniel, MD  levothyroxine (SYNTHROID, LEVOTHROID) 88 MCG tablet Take 88 mcg by mouth daily. 03/17/13   Historical Provider, MD  Multiple Vitamin (MULTIVITAMIN) tablet Take 1 tablet by mouth daily.    Historical Provider, MD  rOPINIRole (REQUIP) 4 MG tablet Take 1 tablet (4 mg total) by mouth 3 (three) times daily. 12/21/15   York Spaniel, MD     ROS:  Out of a complete 14 system review of symptoms, the patient complains only of the following symptoms, and all other reviewed systems are negative.  Walking difficulty  Height  (1.651 m), weight 155 lb 8 oz (70.534 kg).  Physical Exam  General: The patient is alert and cooperative at the time of the examination.  Neuromuscular: The head is in the flexed posture.  Skin: No significant peripheral edema is noted.   Neurologic Exam  Mental status: The patient is alert and oriented x 3 at the time of the examination. The patient has apparent normal recent and remote memory, with an apparently normal attention span and concentration ability. Mini-Mental Status Examination done today shows a total score of 30/30.   Cranial nerves: Facial symmetry is present. Speech is normal, no aphasia or dysarthria is noted. Extraocular movements are full. Visual fields are full.  Motor: The patient has good strength in all 4 extremities.  Sensory examination: Soft touch sensation is symmetric on the face, arms, and legs.  Coordination: The patient has good finger-nose-finger and heel-to-shin bilaterally. The patient has some mild dyskinesias affecting the head and shoulders.  Gait and station: The patient has a normal gait. The patient has difficulty arising from a seated position with arms crossed. Once up, he has good stride, good arm swing. The patient has good turns. Tandem gait is unsteady. Romberg is negative. No drift is  seen.  Reflexes: Deep tendon reflexes are symmetric.   Assessment/Plan:  1. Parkinson's disease  2. Mild memory disturbance  3. Mild gait disturbance  The patient is reporting some episodes of freezing that are occurring. The patient will go up on the Requip again taking 5 mg 3 times daily, this will be phased in over one month. The patient will follow-up in about 5 months. He will contact me if he is not doing well. We will follow the memory issues over  time.  Marlan Palau. Keith Tanvir Hipple MD 03/11/2016 8:20 PM  [facility] [address] [place], [place] [id number]  Phone [phone] Fax [phone]

## 2016-03-11 NOTE — Patient Instructions (Signed)
    With the Requip, start taking the 5 mg tablet. Go 4/4/5 for 2 weeks, then take 5/4/5 for 2 weeks, the go to 5 mg three times a day.

## 2016-04-18 ENCOUNTER — Other Ambulatory Visit: Payer: Self-pay | Admitting: Neurology

## 2016-06-13 ENCOUNTER — Ambulatory Visit (INDEPENDENT_AMBULATORY_CARE_PROVIDER_SITE_OTHER): Payer: Medicare Other | Admitting: Neurology

## 2016-06-13 ENCOUNTER — Encounter: Payer: Self-pay | Admitting: Neurology

## 2016-06-13 ENCOUNTER — Telehealth: Payer: Self-pay | Admitting: Neurology

## 2016-06-13 VITALS — BP 116/60 | HR 60 | Ht 65.0 in | Wt 146.5 lb

## 2016-06-13 DIAGNOSIS — G20 Parkinson's disease: Secondary | ICD-10-CM | POA: Diagnosis not present

## 2016-06-13 DIAGNOSIS — G4752 REM sleep behavior disorder: Secondary | ICD-10-CM

## 2016-06-13 DIAGNOSIS — R269 Unspecified abnormalities of gait and mobility: Secondary | ICD-10-CM | POA: Diagnosis not present

## 2016-06-13 DIAGNOSIS — R413 Other amnesia: Secondary | ICD-10-CM

## 2016-06-13 NOTE — Telephone Encounter (Signed)
Pt called in stating he is having more difficulty walking, standing up. He feels like he is going to fall face forward while standing up. He asked for an earlier appt. However, [name] does not have any openings. Can we work him in? Please call

## 2016-06-13 NOTE — Telephone Encounter (Signed)
Pt's neighbor Sherry [place] called in while at the pts home to ask if pt could be seen today. She states that pts condition is worse than it was yesterday. He refuses to use a walker.  Please call

## 2016-06-13 NOTE — Progress Notes (Signed)
Reason for visit: Parkinson's disease  Craig Giles is an 82 y.o. male  History of present illness:  Craig Giles is an 82 year old right-handed white male with a history of Parkinson's disease. The patient over the last several months his had increasing episodes of freezing. The Requip was increased, but the patient continues to have daily episodes. The patient may have more events in the morning and evening. He is having some drowsiness during the day, he continues to operate a motor vehicle but he also reports some difficulty with directions, and a mild memory disturbance as well. The patient had a more prolonged event of freezing today lasting about a minute. The patient has been walking fairly well the rest of the day, he denies any recent falls. He is now starting use a cane when he walks. The patient denies any difficulty with swallowing or choking. He is not sleeping well at night, he takes naps during the day. He has to get up frequently at night to use the bathroom and he cannot get back to sleep.  Past Medical History  Diagnosis Date  . Parkinson disease (HCC)   . Hypothyroidism   . Memory deficits 10/22/2013  . 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 Tonsillectomy      Family History  Problem Relation Age of Onset  . Diabetes Brother     Social history:  reports that he has never smoked. He has never used smokeless tobacco. He reports that he does not drink alcohol or use illicit drugs.   No Known Allergies  Medications:  Prior to Admission medications   Medication Sig Start Date End Date Taking? Authorizing Provider  carbidopa-levodopa (SINEMET IR) 25-100 MG tablet TAKE 1 TABLET BY MOUTH THREE TIMES DAILY 04/18/16   York Spanielharles K Willis, MD  levothyroxine (SYNTHROID, LEVOTHROID) 88 MCG tablet Take 88 mcg by mouth daily. 03/17/13   Historical Provider, MD  Multiple Vitamin (MULTIVITAMIN) tablet Take 1 tablet by  mouth daily.    Historical Provider, MD  ropinirole (REQUIP) 5 MG tablet Take 1 tablet (5 mg total) by mouth 3 (three) times daily. 03/11/16   York Spanielharles K Willis, MD    ROS:  Out of a complete 14 system review of symptoms, the patient complains only of the following symptoms, and all other reviewed systems are negative.  Decreased activity Drooling Agitation  Blood pressure 116/60, pulse 60, height 5\' 5"  (1.651 m), weight 146 lb 8 oz (66.452 kg).  Physical Exam  General: The patient is alert and cooperative at the time of the examination.  Skin: No significant peripheral edema is noted.   Neurologic Exam  Mental status: The patient is alert and oriented x 3 at the time of the examination. The patient has apparent normal recent and remote memory, with an apparently normal attention span and concentration ability.   Cranial nerves: Facial symmetry is present. Speech is normal, no aphasia or dysarthria is noted. Extraocular movements are full. Visual fields are full. Masking of the face is seen.  Motor: The patient has good strength in all 4 extremities.  Sensory examination: Soft touch sensation is symmetric on the face, arms, and legs.  Coordination: The patient has good finger-nose-finger and heel-to-shin bilaterally. Mild dyskinesias are seen. The patient has a tendency also to lean to the right.  Gait and station: The patient is able to arise from a seated position with arms crossed with some difficulty. Once up, he  is able to ambulate quite well, minimal hesitation with turns. Good arm swing and good stride is seen. Tandem gait is unsteady. Romberg is negative. No drift is seen.  Reflexes: Deep tendon reflexes are symmetric.   Assessment/Plan:  1. Parkinson's disease  2. Mild gait disorder  3. Mild memory disorder  The patient is having some freezing episodes. He is not very active during the day, I have indicated that he needs to be walking on a regular basis. The  patient also needs to pretend like he is stepping over something to get himself out of the freezing episode. He will continue the current medications, I will not go up on the dose. He will follow-up for his next scheduled appointment on 08/12/2016.  Marlan Palau MD 06/13/2016 7:48 PM  [facility] [address] [place], [place] [address]  Phone [phone] Fax [phone]

## 2016-06-13 NOTE — Telephone Encounter (Signed)
Returned call to talk further w/ pt. While on the phone, received notification that [name]' 4:00 appt cancelled for today. Pt agreed to come in at that time. Work-in appt scheduled. Pt to arrive at least 15 min prior. Verbalized understanding and appreciation for call.

## 2016-08-12 ENCOUNTER — Encounter: Payer: Self-pay | Admitting: Neurology

## 2016-08-12 ENCOUNTER — Ambulatory Visit (INDEPENDENT_AMBULATORY_CARE_PROVIDER_SITE_OTHER): Payer: Medicare Other | Admitting: Neurology

## 2016-08-12 VITALS — BP 89/47 | HR 59 | Ht 65.0 in | Wt 144.2 lb

## 2016-08-12 DIAGNOSIS — G4700 Insomnia, unspecified: Secondary | ICD-10-CM

## 2016-08-12 DIAGNOSIS — G20 Parkinson's disease: Secondary | ICD-10-CM

## 2016-08-12 DIAGNOSIS — R413 Other amnesia: Secondary | ICD-10-CM

## 2016-08-12 DIAGNOSIS — R269 Unspecified abnormalities of gait and mobility: Secondary | ICD-10-CM

## 2016-08-12 DIAGNOSIS — F5104 Psychophysiologic insomnia: Secondary | ICD-10-CM | POA: Insufficient documentation

## 2016-08-12 MED ORDER — ROPINIROLE HCL 4 MG PO TABS: 4.0000 mg | ORAL_TABLET | Freq: Three times a day (TID) | ORAL | 5 refills | Status: DC

## 2016-08-12 MED ORDER — TRAZODONE HCL 50 MG PO TABS: 50.0000 mg | ORAL_TABLET | Freq: Every day | ORAL | 2 refills | Status: DC

## 2016-08-12 NOTE — Progress Notes (Signed)
Reason for visit: Parkinson's disease  Craig Giles is an 82 y.o. male  History of present illness:  Mr. Kusek is an 82 year old right-handed white male with a history of Parkinson's disease. The patient has had some issues with freezing recently, he still has episodes that may occur up to 30 seconds. He is also having some delusions that his son is in the house when he is not, and he oftentimes has sensations that someone is watching him in the house. He may see bugs on the floor, and spray the floor with pesticide when there is nothing there. The patient does have some mild memory problems, he has difficulty remembering names, but the memory is not a severe problem for him. The patient has a tendency to lean to the right, he is fidgety, he may slip out of the chair on occasion. The patient does not sleep well at night. He will go to bed around 12:30 AM, and may sleep only an hour and a half. The patient falls asleep while eating or when he is out at a restaurant. The patient comes to this office for an evaluation. He has not sustained any falls. The patient mainly freezes up inside the house, he walks quite well outside. He does have a cane for ambulation.  Past Medical History:  Diagnosis Date  . Hypothyroidism   . Memory deficits 10/22/2013  . Parkinson disease (HCC)   . 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 TONSILLECTOMY      Family History  Problem Relation Age of Onset  . Diabetes Brother     Social history:  reports that he has never smoked. He has never used smokeless tobacco. He reports that he does not drink alcohol or use drugs.   No Known Allergies  Medications:  Prior to Admission medications   Medication Sig Start Date End Date Taking? Authorizing Provider  carbidopa-levodopa (SINEMET IR) 25-100 MG tablet TAKE 1 TABLET BY MOUTH THREE TIMES DAILY 04/18/16   York Spaniel, MD  levothyroxine  (SYNTHROID, LEVOTHROID) 88 MCG tablet Take 88 mcg by mouth daily. 03/17/13   Historical Provider, MD  Multiple Vitamin (MULTIVITAMIN) tablet Take 1 tablet by mouth daily.    Historical Provider, MD  ropinirole (REQUIP) 5 MG tablet Take 1 tablet (5 mg total) by mouth 3 (three) times daily. 03/11/16   York Spaniel, MD    ROS:  Out of a complete 14 system review of symptoms, the patient complains only of the following symptoms, and all other reviewed systems are negative.  Walking difficulty Memory disorder  Blood pressure (!) 89/47, pulse (!) 59, height 5\' 5"  (1.651 m), weight 144 lb 4 oz (65.4 kg).  Physical Exam  General: The patient is alert and cooperative at the time of the examination.  Neuromuscular: The patient has a kyphotic posture, leans to the right.  Skin: No significant peripheral edema is noted.   Neurologic Exam  Mental status: The patient is alert and oriented x 3 at the time of the examination. The patient has apparent normal recent and remote memory, with an apparently normal attention span and concentration ability. Mini-Mental Status Examination done today shows a total score of 30/30.   Cranial nerves: Facial symmetry is present. Speech is normal, no aphasia or dysarthria is noted. Extraocular movements are full. Visual fields are full. Mild masking of the face is seen.  Motor: The patient has good strength in all  4 extremities.  Sensory examination: Soft touch sensation is symmetric on the face, arms, and legs.  Coordination: The patient has good finger-nose-finger and heel-to-shin bilaterally. Mild dyskinesias are noted.  Gait and station: The patient has a normal gait, good stride is seen. The patient has minimal agitation with turns. Tandem gait is slightly unsteady. Romberg is negative. No drift is seen.  Reflexes: Deep tendon reflexes are symmetric.   Assessment/Plan:  1. Parkinson's disease  2. Gait disorder  3. Mild memory disorder  4.  Chronic insomnia  The patient will be followed for the memory issues. The patient is having some problems with delusional thinking, with a sensation that someone is watching him in the house, and with seeing bugs in the house. The Requip dose will be reduced taking 4 mg 3 times daily. He will remain on the current dose of the Sinemet. He is not sleeping well at night, trazodone will be added in the evening hours. He will follow-up in 4 months.  Marlan Palau. Keith Jumaane Weatherford MD 08/12/2016 12:30 PM  [facility] [address] [place], [place] [address]  Phone [phone] Fax [phone]

## 2016-08-12 NOTE — Patient Instructions (Signed)
   We will lower the Requip dose to 4 mg three times a day and try Trazodone 50 mg at night for sleep.

## 2016-08-28 ENCOUNTER — Telehealth: Payer: Self-pay | Admitting: Neurology

## 2016-08-28 MED ORDER — ROPINIROLE HCL 5 MG PO TABS: 5.0000 mg | ORAL_TABLET | Freq: Three times a day (TID) | ORAL | 1 refills | Status: DC

## 2016-08-28 NOTE — Addendum Note (Signed)
Addended by: Stephanie AcreWILLIS, CHARLES on: 08/28/2016 03:43 PM   Modules accepted: Orders

## 2016-08-28 NOTE — Telephone Encounter (Signed)
Patient called, states [name] changed rOPINIRole (REQUIP) 4 MG tablet from 5 MG to 4 MG, after several days, was in terrible shape, wife advised him to go back up to 5 MG, patient wants to know if this is okay or should he go back to 4 MG like [name] instructed. Please call to advise [phone].

## 2016-08-28 NOTE — Telephone Encounter (Signed)
I called patient. The patient has done poorly on the 4 mg Requip tablets, he has had decreased mobility, increased difficulty with walking. We will go back to the 5 mg dosing schedule.

## 2016-09-05 DIAGNOSIS — Z9181 History of falling: Secondary | ICD-10-CM | POA: Diagnosis not present

## 2016-09-05 DIAGNOSIS — Z0000 Encounter for general adult medical examination without abnormal findings: Secondary | ICD-10-CM | POA: Diagnosis not present

## 2016-09-30 DIAGNOSIS — H524 Presbyopia: Secondary | ICD-10-CM | POA: Diagnosis not present

## 2016-10-18 ENCOUNTER — Other Ambulatory Visit: Payer: Self-pay | Admitting: Neurology

## 2016-10-26 ENCOUNTER — Other Ambulatory Visit: Payer: Self-pay | Admitting: Neurology

## 2016-10-27 ENCOUNTER — Other Ambulatory Visit: Payer: Self-pay | Admitting: Neurology

## 2016-11-07 DIAGNOSIS — H25811 Combined forms of age-related cataract, right eye: Secondary | ICD-10-CM | POA: Diagnosis not present

## 2016-11-07 DIAGNOSIS — H52221 Regular astigmatism, right eye: Secondary | ICD-10-CM | POA: Diagnosis not present

## 2016-11-07 DIAGNOSIS — H25011 Cortical age-related cataract, right eye: Secondary | ICD-10-CM | POA: Diagnosis not present

## 2016-11-07 DIAGNOSIS — H2511 Age-related nuclear cataract, right eye: Secondary | ICD-10-CM | POA: Diagnosis not present

## 2016-11-30 ENCOUNTER — Other Ambulatory Visit: Payer: Self-pay | Admitting: Neurology

## 2016-12-16 ENCOUNTER — Ambulatory Visit: Payer: Medicare Other | Admitting: Neurology

## 2016-12-16 ENCOUNTER — Telehealth: Payer: Self-pay

## 2016-12-16 DIAGNOSIS — R6889 Other general symptoms and signs: Secondary | ICD-10-CM | POA: Diagnosis not present

## 2016-12-16 DIAGNOSIS — J101 Influenza due to other identified influenza virus with other respiratory manifestations: Secondary | ICD-10-CM | POA: Diagnosis not present

## 2016-12-16 DIAGNOSIS — Z9181 History of falling: Secondary | ICD-10-CM | POA: Diagnosis not present

## 2016-12-16 DIAGNOSIS — N182 Chronic kidney disease, stage 2 (mild): Secondary | ICD-10-CM | POA: Diagnosis not present

## 2016-12-16 DIAGNOSIS — Z0000 Encounter for general adult medical examination without abnormal findings: Secondary | ICD-10-CM | POA: Diagnosis not present

## 2016-12-16 DIAGNOSIS — E039 Hypothyroidism, unspecified: Secondary | ICD-10-CM | POA: Diagnosis not present

## 2016-12-16 DIAGNOSIS — G20 Parkinson's disease: Secondary | ICD-10-CM | POA: Diagnosis not present

## 2016-12-16 DIAGNOSIS — R600 Localized edema: Secondary | ICD-10-CM | POA: Diagnosis not present

## 2016-12-16 DIAGNOSIS — Z23 Encounter for immunization: Secondary | ICD-10-CM | POA: Diagnosis not present

## 2016-12-16 NOTE — Telephone Encounter (Signed)
Pt called and cancelled same day appt 

## 2016-12-25 DIAGNOSIS — E039 Hypothyroidism, unspecified: Secondary | ICD-10-CM | POA: Diagnosis not present

## 2016-12-25 DIAGNOSIS — G20 Parkinson's disease: Secondary | ICD-10-CM | POA: Diagnosis not present

## 2016-12-25 DIAGNOSIS — Z9181 History of falling: Secondary | ICD-10-CM | POA: Diagnosis not present

## 2016-12-25 DIAGNOSIS — N182 Chronic kidney disease, stage 2 (mild): Secondary | ICD-10-CM | POA: Diagnosis not present

## 2016-12-25 DIAGNOSIS — H9190 Unspecified hearing loss, unspecified ear: Secondary | ICD-10-CM | POA: Diagnosis not present

## 2016-12-30 ENCOUNTER — Ambulatory Visit (INDEPENDENT_AMBULATORY_CARE_PROVIDER_SITE_OTHER): Payer: Medicare Other | Admitting: Neurology

## 2016-12-30 ENCOUNTER — Encounter: Payer: Self-pay | Admitting: Neurology

## 2016-12-30 VITALS — BP 95/53 | HR 52 | Ht 65.0 in | Wt 144.0 lb

## 2016-12-30 DIAGNOSIS — G20 Parkinson's disease: Secondary | ICD-10-CM

## 2016-12-30 DIAGNOSIS — R269 Unspecified abnormalities of gait and mobility: Secondary | ICD-10-CM | POA: Diagnosis not present

## 2016-12-30 DIAGNOSIS — F5104 Psychophysiologic insomnia: Secondary | ICD-10-CM | POA: Diagnosis not present

## 2016-12-30 DIAGNOSIS — R413 Other amnesia: Secondary | ICD-10-CM

## 2016-12-30 MED ORDER — TRAZODONE HCL 100 MG PO TABS: 100.0000 mg | ORAL_TABLET | Freq: Every day | ORAL | 5 refills | Status: AC

## 2016-12-30 MED ORDER — CARBIDOPA-LEVODOPA 25-100 MG PO TABS: 1.0000 | ORAL_TABLET | Freq: Three times a day (TID) | ORAL | 3 refills | Status: AC

## 2016-12-30 NOTE — Progress Notes (Signed)
Reason for visit: Parkinson's disease  Craig Giles is an 83 y.o. male  History of present illness:  Craig Giles is an 84 year old right-handed white male with a history of Parkinson's disease. The patient has had some issues with a mild memory disorder, and some delusional thinking, hallucinations. He claims this is not much more of a problem recently. The patient may occasionally call his wife by his daughter's name. The patient has had difficulty sleeping at night, initially the use of low-dose trazodone was helpful, but the effects seemed to wear off. The patient will get up in the middle the night, unable to get back to sleep. The patient is having episodes of freezing. Given the mild confusion, we tried to lower the Requip dose, but he did not tolerate the reduction in dosing. The patient has dyskinesias, he will have freezing episodes during the day that are not associated with dosing of his Sinemet medication. Some mornings he may have a lot of difficulty getting going. The patient uses a cane for ambulation. He has a walker, but he will not use it. He is not very active during the day, he will not perform daily exercises. A physical therapist will be coming into the home environment in the near future. The patient does report some difficulty with a head drop associated with Parkinson's and with kyphosis of the spine.  Past Medical History:  Diagnosis Date  . Hypothyroidism   . Memory deficits 10/22/2013  . Parkinson disease (HCC)   . 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 TONSILLECTOMY      Family History  Problem Relation Age of Onset  . Diabetes Brother     Social history:  reports that he has never smoked. He has never used smokeless tobacco. He reports that he does not drink alcohol or use drugs.   No Known Allergies  Medications:  Prior to Admission medications   Medication Sig Start Date End Date  Taking? Authorizing Provider  carbidopa-levodopa (SINEMET IR) 25-100 MG tablet Take 1 tablet by mouth 3 (three) times daily. 12/30/16  Yes York Spaniel, MD  levothyroxine (SYNTHROID, LEVOTHROID) 88 MCG tablet Take 88 mcg by mouth daily. 03/17/13  Yes Historical Provider, MD  Multiple Vitamin (MULTIVITAMIN) tablet Take 1 tablet by mouth daily.   Yes Historical Provider, MD  ropinirole (REQUIP) 5 MG tablet Take 1 tablet (5 mg total) by mouth 3 (three) times daily. 08/28/16  Yes York Spaniel, MD  traZODone (DESYREL) 100 MG tablet Take 1 tablet (100 mg total) by mouth at bedtime. 12/30/16   York Spaniel, MD    ROS:  Out of a complete 14 system review of symptoms, the patient complains only of the following symptoms, and all other reviewed systems are negative.  Memory disturbance  Blood pressure (!) 95/53, pulse (!) 52, height 5\' 5"  (1.651 m), weight 144 lb (65.3 kg).  Physical Exam  General: The patient is alert and cooperative at the time of the examination.  Skin: No significant peripheral edema is noted.   Neurologic Exam  Mental status: The patient is alert and oriented x 3 at the time of the examination. The patient has apparent normal recent and remote memory, with an apparently normal attention span and concentration ability. Mini-Mental Status Examination done today shows a total score 27/30. The patient is able to name 7 animals in 30 seconds.   Cranial nerves: Facial symmetry is present. Speech  is normal, no aphasia or dysarthria is noted. Extraocular movements are full. Visual fields are full.  Motor: The patient has good strength in all 4 extremities.  Sensory examination: Soft touch sensation is symmetric on the face, arms, and legs.  Coordination: The patient has good finger-nose-finger and heel-to-shin bilaterally. The patient has dyskinesias of the head and neck, axial muscles.  Gait and station: The patient has the ability to ambulate quite well, he has good  stride, returns. Tandem gait is unsteady. Romberg is negative. No drift is seen.  Reflexes: Deep tendon reflexes are symmetric.   Assessment/Plan:  1. Parkinson's disease  2. Memory disorder  3. Gait disorder  4. Chronic insomnia  The patient is having difficulty with insomnia at night, he does not sleep well at night, he will not function well during the day. He reports episodes of freezing that are not related to dosing of medication. The patient is having dyskinesias currently, during this time, he can walk quite effectively. The patient will be given a higher dose of trazodone, 100 mg at night to help his sleep. He will follow-up in about 5 months. We may consider medications for memory in the future. The patient is not very active, he is encouraged to develop an exercise program with the physical therapist.  Marlan Palau. Keith Willis MD 12/30/2016 8:31 AM  [facility] [address] [place], [place] [address]  Phone [phone] Fax [phone]

## 2017-01-30 ENCOUNTER — Other Ambulatory Visit: Payer: Self-pay | Admitting: Neurology

## 2017-01-30 DIAGNOSIS — H25012 Cortical age-related cataract, left eye: Secondary | ICD-10-CM | POA: Diagnosis not present

## 2017-01-30 DIAGNOSIS — H25812 Combined forms of age-related cataract, left eye: Secondary | ICD-10-CM | POA: Diagnosis not present

## 2017-01-30 DIAGNOSIS — H2512 Age-related nuclear cataract, left eye: Secondary | ICD-10-CM | POA: Diagnosis not present

## 2017-02-26 NOTE — Telephone Encounter (Signed)
Error

## 2017-03-13 DIAGNOSIS — R2689 Other abnormalities of gait and mobility: Secondary | ICD-10-CM | POA: Diagnosis not present

## 2017-03-13 DIAGNOSIS — G20 Parkinson's disease: Secondary | ICD-10-CM | POA: Diagnosis not present

## 2017-03-13 DIAGNOSIS — R269 Unspecified abnormalities of gait and mobility: Secondary | ICD-10-CM | POA: Diagnosis not present

## 2017-03-31 ENCOUNTER — Encounter (HOSPITAL_COMMUNITY): Payer: Self-pay | Admitting: Emergency Medicine

## 2017-03-31 ENCOUNTER — Inpatient Hospital Stay (HOSPITAL_COMMUNITY)
Admission: EM | Admit: 2017-03-31 | Discharge: 2017-04-03 | DRG: 871 | Disposition: A | Discharge status: Skilled Nursing Facility | Payer: Medicare Other | Attending: Internal Medicine | Admitting: Internal Medicine

## 2017-03-31 ENCOUNTER — Emergency Department (HOSPITAL_COMMUNITY): Payer: Medicare Other

## 2017-03-31 DIAGNOSIS — K5641 Fecal impaction: Secondary | ICD-10-CM | POA: Diagnosis present

## 2017-03-31 DIAGNOSIS — N210 Calculus in bladder: Secondary | ICD-10-CM | POA: Diagnosis not present

## 2017-03-31 DIAGNOSIS — R402441 Other coma, without documented Glasgow coma scale score, or with partial score reported, in the field [EMT or ambulance]: Secondary | ICD-10-CM | POA: Diagnosis not present

## 2017-03-31 DIAGNOSIS — A419 Sepsis, unspecified organism: Secondary | ICD-10-CM | POA: Diagnosis not present

## 2017-03-31 DIAGNOSIS — G20 Parkinson's disease: Secondary | ICD-10-CM | POA: Diagnosis present

## 2017-03-31 DIAGNOSIS — R6521 Severe sepsis with septic shock: Secondary | ICD-10-CM | POA: Diagnosis present

## 2017-03-31 DIAGNOSIS — N132 Hydronephrosis with renal and ureteral calculous obstruction: Secondary | ICD-10-CM | POA: Diagnosis present

## 2017-03-31 DIAGNOSIS — R0602 Shortness of breath: Secondary | ICD-10-CM | POA: Diagnosis not present

## 2017-03-31 DIAGNOSIS — N138 Other obstructive and reflux uropathy: Secondary | ICD-10-CM | POA: Diagnosis present

## 2017-03-31 DIAGNOSIS — G9340 Encephalopathy, unspecified: Secondary | ICD-10-CM | POA: Diagnosis not present

## 2017-03-31 DIAGNOSIS — R278 Other lack of coordination: Secondary | ICD-10-CM | POA: Diagnosis not present

## 2017-03-31 DIAGNOSIS — R109 Unspecified abdominal pain: Secondary | ICD-10-CM

## 2017-03-31 DIAGNOSIS — N139 Obstructive and reflux uropathy, unspecified: Secondary | ICD-10-CM | POA: Diagnosis not present

## 2017-03-31 DIAGNOSIS — J80 Acute respiratory distress syndrome: Secondary | ICD-10-CM | POA: Diagnosis not present

## 2017-03-31 DIAGNOSIS — N200 Calculus of kidney: Secondary | ICD-10-CM

## 2017-03-31 DIAGNOSIS — R64 Cachexia: Secondary | ICD-10-CM | POA: Diagnosis not present

## 2017-03-31 DIAGNOSIS — R402352 Coma scale, best motor response, localizes pain, at arrival to emergency department: Secondary | ICD-10-CM | POA: Diagnosis present

## 2017-03-31 DIAGNOSIS — R531 Weakness: Secondary | ICD-10-CM | POA: Diagnosis not present

## 2017-03-31 DIAGNOSIS — J189 Pneumonia, unspecified organism: Secondary | ICD-10-CM

## 2017-03-31 DIAGNOSIS — L89611 Pressure ulcer of right heel, stage 1: Secondary | ICD-10-CM | POA: Diagnosis present

## 2017-03-31 DIAGNOSIS — R188 Other ascites: Secondary | ICD-10-CM | POA: Diagnosis not present

## 2017-03-31 DIAGNOSIS — E039 Hypothyroidism, unspecified: Secondary | ICD-10-CM | POA: Diagnosis present

## 2017-03-31 DIAGNOSIS — Z0389 Encounter for observation for other suspected diseases and conditions ruled out: Secondary | ICD-10-CM | POA: Diagnosis not present

## 2017-03-31 DIAGNOSIS — N179 Acute kidney failure, unspecified: Secondary | ICD-10-CM | POA: Diagnosis not present

## 2017-03-31 DIAGNOSIS — R402242 Coma scale, best verbal response, confused conversation, at arrival to emergency department: Secondary | ICD-10-CM | POA: Diagnosis present

## 2017-03-31 DIAGNOSIS — F5104 Psychophysiologic insomnia: Secondary | ICD-10-CM | POA: Diagnosis not present

## 2017-03-31 DIAGNOSIS — J690 Pneumonitis due to inhalation of food and vomit: Secondary | ICD-10-CM | POA: Diagnosis present

## 2017-03-31 DIAGNOSIS — R410 Disorientation, unspecified: Secondary | ICD-10-CM | POA: Diagnosis not present

## 2017-03-31 DIAGNOSIS — Z833 Family history of diabetes mellitus: Secondary | ICD-10-CM

## 2017-03-31 DIAGNOSIS — R4182 Altered mental status, unspecified: Secondary | ICD-10-CM | POA: Diagnosis not present

## 2017-03-31 DIAGNOSIS — N320 Bladder-neck obstruction: Secondary | ICD-10-CM | POA: Diagnosis present

## 2017-03-31 DIAGNOSIS — R1311 Dysphagia, oral phase: Secondary | ICD-10-CM | POA: Diagnosis not present

## 2017-03-31 DIAGNOSIS — R10819 Abdominal tenderness, unspecified site: Secondary | ICD-10-CM | POA: Diagnosis not present

## 2017-03-31 DIAGNOSIS — R402122 Coma scale, eyes open, to pain, at arrival to emergency department: Secondary | ICD-10-CM | POA: Diagnosis not present

## 2017-03-31 DIAGNOSIS — F919 Conduct disorder, unspecified: Secondary | ICD-10-CM | POA: Diagnosis present

## 2017-03-31 DIAGNOSIS — E871 Hypo-osmolality and hyponatremia: Secondary | ICD-10-CM | POA: Diagnosis not present

## 2017-03-31 DIAGNOSIS — N401 Enlarged prostate with lower urinary tract symptoms: Secondary | ICD-10-CM | POA: Diagnosis present

## 2017-03-31 DIAGNOSIS — L8990 Pressure ulcer of unspecified site, unspecified stage: Secondary | ICD-10-CM | POA: Insufficient documentation

## 2017-03-31 DIAGNOSIS — J9811 Atelectasis: Secondary | ICD-10-CM | POA: Diagnosis not present

## 2017-03-31 DIAGNOSIS — R498 Other voice and resonance disorders: Secondary | ICD-10-CM | POA: Diagnosis not present

## 2017-03-31 DIAGNOSIS — L89621 Pressure ulcer of left heel, stage 1: Secondary | ICD-10-CM | POA: Diagnosis present

## 2017-03-31 DIAGNOSIS — Z6824 Body mass index (BMI) 24.0-24.9, adult: Secondary | ICD-10-CM

## 2017-03-31 DIAGNOSIS — I2510 Atherosclerotic heart disease of native coronary artery without angina pectoris: Secondary | ICD-10-CM | POA: Diagnosis present

## 2017-03-31 LAB — BLOOD GAS, VENOUS

## 2017-03-31 LAB — COMPREHENSIVE METABOLIC PANEL
ALT: 6 U/L — AB (ref 17–63)
ANION GAP: 6 (ref 5–15)
AST: 33 U/L (ref 15–41)
Albumin: 3.5 g/dL (ref 3.5–5.0)
Alkaline Phosphatase: 68 U/L (ref 38–126)
BUN: 21 mg/dL — ABNORMAL HIGH (ref 6–20)
CHLORIDE: 102 mmol/L (ref 101–111)
CO2: 25 mmol/L (ref 22–32)
CREATININE: 2.18 mg/dL — AB (ref 0.61–1.24)
Calcium: 9.3 mg/dL (ref 8.9–10.3)
GFR, EST AFRICAN AMERICAN: 30 mL/min — AB (ref >60)
GFR, EST NON AFRICAN AMERICAN: 26 mL/min — AB (ref >60)
Glucose, Bld: 122 mg/dL — ABNORMAL HIGH (ref 65–99)
Potassium: 4.2 mmol/L (ref 3.5–5.1)
SODIUM: 133 mmol/L — AB (ref 135–145)
Total Bilirubin: 1.8 mg/dL — ABNORMAL HIGH (ref 0.3–1.2)
Total Protein: 6 g/dL — ABNORMAL LOW (ref 6.5–8.1)

## 2017-03-31 LAB — CBC WITH DIFFERENTIAL/PLATELET
BASOS PCT: 0 %
Basophils Absolute: 0 10*3/uL (ref 0.0–0.1)
EOS ABS: 0 10*3/uL (ref 0.0–0.7)
Eosinophils Relative: 0 %
HEMATOCRIT: 35.8 % — AB (ref 39.0–52.0)
HEMOGLOBIN: 12.2 g/dL — AB (ref 13.0–17.0)
LYMPHS ABS: 0.9 10*3/uL (ref 0.7–4.0)
Lymphocytes Relative: 7 %
MCH: 30.4 pg (ref 26.0–34.0)
MCHC: 34.1 g/dL (ref 30.0–36.0)
MCV: 89.3 fL (ref 78.0–100.0)
MONOS PCT: 5 %
Monocytes Absolute: 0.7 10*3/uL (ref 0.1–1.0)
NEUTROS ABS: 12 10*3/uL — AB (ref 1.7–7.7)
NEUTROS PCT: 88 %
Platelets: 153 10*3/uL (ref 150–400)
RBC: 4.01 MIL/uL — AB (ref 4.22–5.81)
RDW: 14.5 % (ref 11.5–15.5)
WBC: 13.6 10*3/uL — AB (ref 4.0–10.5)

## 2017-03-31 LAB — URINALYSIS, ROUTINE W REFLEX MICROSCOPIC
BILIRUBIN URINE: NEGATIVE
Glucose, UA: NEGATIVE mg/dL
Ketones, ur: NEGATIVE mg/dL
Leukocytes, UA: NEGATIVE
NITRITE: NEGATIVE
PROTEIN: NEGATIVE mg/dL
Specific Gravity, Urine: 1.009 (ref 1.005–1.030)
Squamous Epithelial / LPF: NONE SEEN
pH: 5 (ref 5.0–8.0)

## 2017-03-31 LAB — I-STAT VENOUS BLOOD GAS, ED
Acid-Base Excess: 4 mmol/L — ABNORMAL HIGH (ref 0.0–2.0)
BICARBONATE: 27.8 mmol/L (ref 20.0–28.0)
O2 Saturation: 100 %
PCO2 VEN: 36.2 mmHg — AB (ref 44.0–60.0)
PH VEN: 7.493 — AB (ref 7.250–7.430)
TCO2: 29 mmol/L (ref 0–100)
pO2, Ven: 166 mmHg — ABNORMAL HIGH (ref 32.0–45.0)

## 2017-03-31 LAB — TSH: TSH: 3.023 u[IU]/mL (ref 0.350–4.500)

## 2017-03-31 LAB — BRAIN NATRIURETIC PEPTIDE: B Natriuretic Peptide: 246.1 pg/mL — ABNORMAL HIGH (ref 0.0–100.0)

## 2017-03-31 LAB — I-STAT CG4 LACTIC ACID, ED: LACTIC ACID, VENOUS: 1.68 mmol/L (ref 0.5–1.9)

## 2017-03-31 LAB — I-STAT TROPONIN, ED: TROPONIN I, POC: 0.01 ng/mL (ref 0.00–0.08)

## 2017-03-31 LAB — LIPASE, BLOOD: LIPASE: 17 U/L (ref 11–51)

## 2017-03-31 MED ORDER — VANCOMYCIN HCL IN DEXTROSE 1-5 GM/200ML-% IV SOLN: 1000.0000 mg | INTRAVENOUS | Status: DC

## 2017-03-31 MED ORDER — ROPINIROLE HCL 1 MG PO TABS
5.0000 mg | ORAL_TABLET | Freq: Three times a day (TID) | ORAL | Status: DC
  Administered 2017-03-31 – 2017-04-03 (×9): 5 mg via ORAL
  Filled 2017-03-31 (×10): qty 5

## 2017-03-31 MED ORDER — SODIUM CHLORIDE 0.9 % IV BOLUS (SEPSIS): 250.0000 mL | Freq: Once | INTRAVENOUS | Status: DC

## 2017-03-31 MED ORDER — SODIUM CHLORIDE 0.9 % IV BOLUS (SEPSIS)
1000.0000 mL | Freq: Once | INTRAVENOUS | Status: AC
  Administered 2017-03-31: 1000 mL via INTRAVENOUS

## 2017-03-31 MED ORDER — VANCOMYCIN HCL IN DEXTROSE 1-5 GM/200ML-% IV SOLN: 1000.0000 mg | Freq: Once | INTRAVENOUS | Status: DC

## 2017-03-31 MED ORDER — PIPERACILLIN-TAZOBACTAM 3.375 G IVPB 30 MIN
3.3750 g | Freq: Once | INTRAVENOUS | Status: AC
  Administered 2017-03-31: 3.375 g via INTRAVENOUS
  Filled 2017-03-31: qty 50

## 2017-03-31 MED ORDER — LEVOTHYROXINE SODIUM 88 MCG PO TABS
88.0000 ug | ORAL_TABLET | Freq: Every day | ORAL | Status: DC
  Administered 2017-04-01 – 2017-04-03 (×3): 88 ug via ORAL
  Filled 2017-03-31 (×4): qty 1

## 2017-03-31 MED ORDER — ACETAMINOPHEN 325 MG PO TABS: 650.0000 mg | ORAL_TABLET | Freq: Four times a day (QID) | ORAL | Status: DC | PRN

## 2017-03-31 MED ORDER — CARBIDOPA-LEVODOPA 25-100 MG PO TABS
1.0000 | ORAL_TABLET | Freq: Three times a day (TID) | ORAL | Status: DC
  Administered 2017-03-31 – 2017-04-03 (×10): 1 via ORAL
  Filled 2017-03-31 (×11): qty 1

## 2017-03-31 MED ORDER — PIPERACILLIN-TAZOBACTAM 3.375 G IVPB
3.3750 g | Freq: Three times a day (TID) | INTRAVENOUS | Status: DC
  Administered 2017-03-31 – 2017-04-01 (×2): 3.375 g via INTRAVENOUS
  Filled 2017-03-31 (×3): qty 50

## 2017-03-31 MED ORDER — BISACODYL 10 MG RE SUPP: 10.0000 mg | Freq: Every day | RECTAL | Status: DC | PRN

## 2017-03-31 MED ORDER — ACETAMINOPHEN 650 MG RE SUPP: 650.0000 mg | Freq: Four times a day (QID) | RECTAL | Status: DC | PRN

## 2017-03-31 MED ORDER — DEXTROSE-NACL 5-0.45 % IV SOLN
INTRAVENOUS | Status: DC
  Administered 2017-03-31: 18:00:00 via INTRAVENOUS

## 2017-03-31 MED ORDER — VANCOMYCIN HCL 10 G IV SOLR
1500.0000 mg | Freq: Once | INTRAVENOUS | Status: AC
  Administered 2017-03-31: 1500 mg via INTRAVENOUS
  Filled 2017-03-31: qty 1500

## 2017-03-31 MED ORDER — ENOXAPARIN SODIUM 30 MG/0.3ML ~~LOC~~ SOLN
30.0000 mg | SUBCUTANEOUS | Status: DC
  Administered 2017-03-31 – 2017-04-01 (×2): 30 mg via SUBCUTANEOUS
  Filled 2017-03-31 (×2): qty 0.3

## 2017-03-31 NOTE — ED Provider Notes (Signed)
[facility]-EMERGENCY DEPT Provider Note   CSN: [id number] Arrival date & time: 03/31/17  1046     History   Chief Complaint Chief Complaint  Patient presents with  . Fever  . Encopresis  . Constipation  . ams    HPI Craig Giles is a 83 y.o. male.  Patient brought in by EMS called by wife and son with 3 day history of decline with symptoms of weakness - today could not get out of bed or walk - c/o abdominal pain and "my prostate hurts". He has a history of Parkinson's but is usually able to meet normal ADL's without assistance. Over the last 3 days he has been progressively unable to walk. He has been eating less and sleeping more. No known fever at home but EMS reports 102 temp on their arrival and gave Tylenol. Wife states he has been constipated and she gave laxatives with good result. Today he has had multiple, nonmelanic, nonbloody bowel movements with poor control.    The history is provided by the patient. No language interpreter was used.  Fever    Constipation   Associated symptoms include abdominal pain.    Past Medical History:  Diagnosis Date  . Hypothyroidism   . Memory deficits 10/22/2013  . Parkinson disease (HCC)   . 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 Chronic insomnia 08/12/2016  . 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 Memory deficits 10/22/2013  . Abnormality of gait 11/10/2012  . Paralysis agitans (HCC) 11/10/2012    Past Surgical History:  Procedure Laterality Date  . TONSILLECTOMY         Home Medications    Prior to Admission medications   Medication Sig Start Date End Date Taking? Authorizing Provider  acetaminophen (TYLENOL) 325 MG tablet Take 650 mg by mouth every 6 (six) hours as needed for mild pain.   Yes Historical Provider, MD  Ascorbic Acid (VITAMIN C) 100 MG tablet Take 100 mg by mouth daily.   Yes Historical Provider, MD  carbidopa-levodopa (SINEMET IR) 25-100  MG tablet Take 1 tablet by mouth 3 (three) times daily. 12/30/16  Yes York Spaniel, MD  levothyroxine (SYNTHROID, LEVOTHROID) 88 MCG tablet Take 88 mcg by mouth daily. 03/17/13  Yes Historical Provider, MD  Multiple Vitamin (MULTIVITAMIN) tablet Take 1 tablet by mouth daily.   Yes Historical Provider, MD  Multiple Vitamins-Minerals (ZINC PO) Take 1 tablet by mouth daily.   Yes Historical Provider, MD  polyethylene glycol (MIRALAX / GLYCOLAX) packet Take 17 g by mouth daily as needed for mild constipation.   Yes Historical Provider, MD  ropinirole (REQUIP) 5 MG tablet Take 1 tablet (5 mg total) by mouth 3 (three) times daily. 08/28/16  Yes York Spaniel, MD  traZODone (DESYREL) 100 MG tablet Take 1 tablet (100 mg total) by mouth at bedtime. 12/30/16  Yes York Spaniel, MD    Family History Family History  Problem Relation Age of Onset  . Diabetes Brother     Social History Social History  Substance Use Topics  . Smoking status: Never Smoker  . Smokeless tobacco: Never Used  . Alcohol use No     Allergies   Patient has no known allergies.   Review of Systems Review of Systems  Constitutional: Positive for fever. Negative for chills.  HENT: Negative.   Respiratory: Negative.   Cardiovascular: Negative.   Gastrointestinal: Positive for abdominal pain and constipation.  Musculoskeletal: Negative.   Skin: Negative.   Neurological: Negative.      Physical Exam Updated Vital Signs BP (!) 79/58   Pulse 63   Temp 99.4 F (37.4 C) (Rectal)   Resp (!) 21   Wt 68 kg   SpO2 97%   BMI 24.96 kg/m   Physical Exam  Constitutional: He appears well-developed and well-nourished.  Cachectic appearing elderly male, sleeping, easily awakened.  HENT:  Head: Normocephalic and atraumatic.  Eyes:  Pupils pinpoint  Neck: Neck supple.  Cardiovascular: Normal rate.   Grade 1-2/6 systolic murmur  Pulmonary/Chest:  Shallow respirations, good effort. Slightly diminshed on right.    Abdominal: Soft. He exhibits distension. There is tenderness.  Lower portion of abdomen protuberant, tense and tender.  Neurological: GCS eye subscore is 2. GCS verbal subscore is 4. GCS motor subscore is 5.  Skin: Skin is warm and dry.     ED Treatments / Results  Labs (all labs ordered are listed, but only abnormal results are displayed) Labs Reviewed  CBC WITH DIFFERENTIAL/PLATELET - Abnormal; Notable for the following:       Result Value   WBC 13.6 (*)    RBC 4.01 (*)    Hemoglobin 12.2 (*)    HCT 35.8 (*)    Neutro Abs 12.0 (*)    All other components within normal limits  CULTURE, BLOOD (ROUTINE X 2)  CULTURE, BLOOD (ROUTINE X 2)  COMPREHENSIVE METABOLIC PANEL  URINALYSIS, ROUTINE W REFLEX MICROSCOPIC  BLOOD GAS, VENOUS  LIPASE, BLOOD  BRAIN NATRIURETIC PEPTIDE  I-STAT CG4 LACTIC ACID, ED  Rosezena Sensor, ED    EKG  EKG Interpretation None       Radiology Dg Chest Port 1 View  Result Date: 03/31/2017 CLINICAL DATA:  83 year old presenting with a 3 day history of abdominal pain, fever and hypotension. Nonsmoker. EXAM: PORTABLE CHEST 1 VIEW COMPARISON:  None. FINDINGS: Suboptimal inspiration accounts for crowded bronchovascular markings, especially in the bases, and accentuates the cardiac silhouette. Taking this into account, cardiac silhouette moderately enlarged. Thoracic aorta tortuous and atherosclerotic. Airspace consolidation in the right lower lobe. Minimal linear scar or atelectasis in the left base. Lungs otherwise clear. Pulmonary vascularity normal. No visible pleural effusions. IMPRESSION: Suboptimal inspiration. Right lower lobe pneumonia. Linear scar atelectasis at the left lung base. Electronically Signed   By: Hulan Saas M.D.   On: 03/31/2017 12:34    Procedures Procedures (including critical care time)  Medications Ordered in ED Medications  sodium chloride 0.9 % bolus 1,000 mL (not administered)    And  sodium chloride 0.9 % bolus  1,000 mL (not administered)    And  sodium chloride 0.9 % bolus 250 mL (not administered)  piperacillin-tazobactam (ZOSYN) IVPB 3.375 g (not administered)  vancomycin (VANCOCIN) 1,500 mg in sodium chloride 0.9 % 500 mL IVPB (not administered)     Initial Impression / Assessment and Plan / ED Course  I have reviewed the triage vital signs and the nursing notes.  Pertinent labs & imaging results that were available during my care of the patient were reviewed by me and considered in my medical decision making (see chart for details).     Patient presents with family who report progressive weakness and altered mental status for 3 days. He meets code sepsis parameters with hypotension, AMS, fever. Code sepsis protocol initiated.   Patient care signed out to Dr. Rush Landmark for anticipated admission. Labs/imaging incomplete currently. Patient is stable.   Final Clinical Impressions(s) / ED  Diagnoses   Final diagnoses:  None   1. Sepsis 2. RML PNA 3. Abdominal pain  New Prescriptions New Prescriptions   No medications on file     Elpidio Anis, Cordelia Poche 03/31/17 1334    Canary Brim Tegeler, MD 04/04/17 1215

## 2017-03-31 NOTE — ED Triage Notes (Addendum)
Pt to ED via [facility] with c/o "not feeling well, unable to to walk, confused, stiff, "  Wife states pt has been constipated and was given miralax and tea and has diaarhea since. Son with pt also-- states that pt needs more help than they are able to give him.  EMS stated that pt's temp was 102 -- received tylenol 1gm at 1030.  Pt's normal activity is walking with a cane, able to get to bed and walk around house-- but slowly-- son states that "last time he had this he had the flu" --

## 2017-03-31 NOTE — Progress Notes (Signed)
Pharmacy Antibiotic Note  Craig Giles is a 83 y.o. male admitted on 03/31/2017 with sepsis.  Pharmacy has been consulted for vancomycin and Zosyn dosing.Tmax 102, WBC 13.6, La 1.68  Plan: Vancomycin  IV once then  IV every 48 hours.  Goal trough 15-20 mcg/mL. Zosyn 3.375g IV q8h (4 hour infusion). F/u clinical progression, LOT, cultures  Weight: 150 lb (68 kg)  Temp (24hrs), Avg:99.4 F (37.4 C), Min:99.4 F (37.4 C), Max:99.4 F (37.4 C)   Recent Labs Lab 03/31/17 1150 03/31/17 1216  WBC 13.6*  --   LATICACIDVEN  --  1.68    CrCl cannot be calculated (No order found.).    No Known Allergies   Thank you for allowing pharmacy to be a part of this patient's care.  Alinda Money L Amory Zbikowski 03/31/2017 1:20 PM

## 2017-03-31 NOTE — ED Notes (Signed)
pts son number [phone]  Rosanne caligure wife [phone]

## 2017-03-31 NOTE — ED Notes (Signed)
Attempted report 

## 2017-03-31 NOTE — ED Provider Notes (Signed)
Care assumed from [facility].  At time of transfer of care, patient is waiting results of CT scan of the abdomen and pelvis as well as chest x-ray. Patient was made a code sepsis up on my valuation and determination that patient was febrile, hypotensive, and had altered mental status.  Code sepsis called. Patient had cultures obtained and broad-spectrum antibiotics were initiated. Patient had improvement in blood pressure with fluids.  Workup revealed evidence of pneumonia. This is likely the source of symptoms. Patient also found to have acute urinary retention but no UTI. Foley catheter was placed with resolution of abdominal pain and distention.   GivenSepsis and pneumonia, patient will be admitted for further management. Patient continued to receive fluids with improvement in blood pressure. Just prior to admission, patient was reassessed. Patient had improvement in blood pressure on my reassessment.  Patient admitted in stable condition.   Sepsis - Repeat Assessment  Performed at:   4pm   Vitals     Blood pressure (!) 87/60, pulse (!) 55, temperature 99.4 F (37.4 C), temperature source Rectal, resp. rate 13, weight 150 lb (68 kg), SpO2 97 %.  Heart:     Regular rate and rhythm  Lungs:    CTA  Capillary Refill:   <2 sec  Peripheral Pulse:   Radial pulse palpable  Skin:     Normal Color     Clinical Impression: 1. Abdominal pain   2. Pneumonia     Disposition: Admit to internal medicine service     Heide Scales, MD 03/31/17 2126

## 2017-03-31 NOTE — H&P (Signed)
Date: 03/31/2017               Patient Name:  Craig Giles MRN: [id number]  DOB: 05-13-1931 Age / Sex: 83 y.o., male   PCP: Willow Ora, MD         Medical Service: Internal Medicine Teaching Service         Attending Physician: Dr. Burns Spain, MD    First Contact: Dr. Nelson Chimes Pager: [phone]  Second Contact: Dr. Loney Loh Pager: [phone]       After Hours (After 5p/  First Contact Pager: [phone]  weekends / holidays): Second Contact Pager: [phone]   Chief Complaint: Worsening generalized weakness.  History of Present Illness: Craig Giles is a 83 y.o. man with PMHx significant for Parkinson's , hypothyroidism, and paralysis agitans brought to ED via EMS with complaint of her worsening generalized weakness with urinary and  BM incontinent and confusion for last 3 days.  According to his wife and son were present in the room patient had sedentary lifestyle at baseline-unable to do his ADLs without assistance, but over the last 3 days he was having this worsening generalized weakness and incontinence with decreased appetite and by mouth intake. He was also complaining of lower abdominal pain with urinary frequency and urgency. Patient's wife denied any fever or chills-when EMS found him he was febrile at 102 this morning. She was also denying any upper respiratory symptoms including nasal congestion, coughing, chest pain, shortness of breath, recent illnesses or sick contacts. She denied having any nausea or vomiting. According to wife he was constipated and when he was complaining of lower abdominal pain she attributed to his constipation and gave him MiraLAX on Saturday-on Sunday he had diarrhea with incontinence. His diarrhea resolved on  Monday.  On arrival to ED he was hypotensive at 79/58, temperature of 99.4, saturating well on room air and oriented only to self- On lab was having leukocytosis at 13.6, mild hyponatremia, hyperbilirubinemia, AK I and UA shows  hematuria and CT shows multiple obstructing bladder stones and right nephrolithiasis,CXR was also concerning for right lower lobe infiltrate versus atelectasis.  Meds:  Current Meds  Medication Sig  . acetaminophen (TYLENOL) 325 MG tablet Take 650 mg by mouth every 6 (six) hours as needed for mild pain.  . Ascorbic Acid (VITAMIN C) 100 MG tablet Take 100 mg by mouth daily.  . carbidopa-levodopa (SINEMET IR) 25-100 MG tablet Take 1 tablet by mouth 3 (three) times daily.  Marland Kitchen levothyroxine (SYNTHROID, LEVOTHROID) 88 MCG tablet Take 88 mcg by mouth daily.  . Multiple Vitamin (MULTIVITAMIN) tablet Take 1 tablet by mouth daily.  . Multiple Vitamins-Minerals (ZINC PO) Take 1 tablet by mouth daily.  . polyethylene glycol (MIRALAX / GLYCOLAX) packet Take 17 g by mouth daily as needed for mild constipation.  . ropinirole (REQUIP) 5 MG tablet Take 1 tablet (5 mg total) by mouth 3 (three) times daily.  . traZODone (DESYREL) 100 MG tablet Take 1 tablet (100 mg total) by mouth at bedtime.     Allergies: Allergies as of 03/31/2017  . (No Known Allergies)   Past Medical History:  Diagnosis Date  . Hypothyroidism   . Memory deficits 10/22/2013  . Parkinson disease (HCC)   . 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  Social History: Lives with his wife. Nonsmoker, very occasionally drink alcohol, denies any illicit drug use.  Review of Systems:  A complete ROS was negative except as per HPI.   Physical Exam: Blood pressure (!) 87/60, pulse (!) 55, temperature 99.4 F (37.4 C), temperature source Rectal, resp. rate 13, weight 150 lb (68 kg), SpO2 97 %. Vitals:   03/31/17 1615 03/31/17 1645 03/31/17 1700 03/31/17 1715  BP: (!) 105/59 (!) 87/60 106/67 116/65  Pulse: (!) 59 (!) 55 (!) 56 (!) 56  Resp: Temp:      TempSrc:      SpO2: 96% 97% 98% 98%  Weight:       General: Vital signs reviewed.  Patient is  well-developed and well-nourished, in no acute distress. Head: Normocephalic and atraumatic. Neck: Supple, trachea midline,no JVD, masses, thyromegaly, or carotid bruit present.  Cardiovascular: RRR,  2/6 systolic murmur. Pulmonary/Chest: Clear to auscultation bilaterally on anterior auscultation. Abdominal: Soft, non-tender, non-distended, BS +, no masses, organomegaly, or guarding present.  Extremities: No lower extremity edema bilaterally,  pulses symmetric and intact bilaterally. No cyanosis or clubbing. Neurological: Appears somnolent, easily arousable-oriented only to self, able to follow simple commands but was unable to participate in neurologic exam. Skin: Warm, dry and intact. No rashes or erythema.  Labs. CBC    Component Value Date/Time   WBC 13.6 (H) 03/31/2017 1150   RBC 4.01 (L) 03/31/2017 1150   HGB 12.2 (L) 03/31/2017 1150   HCT 35.8 (L) 03/31/2017 1150   PLT 153 03/31/2017 1150   MCV 89.3 03/31/2017 1150   MCH 30.4 03/31/2017 1150   MCHC 34.1 03/31/2017 1150   RDW 14.5 03/31/2017 1150   LYMPHSABS 0.9 03/31/2017 1150   MONOABS 0.7 03/31/2017 1150   EOSABS 0.0 03/31/2017 1150   BASOSABS 0.0 03/31/2017 1150   CMP     Component Value Date/Time   NA 133 (L) 03/31/2017 1150   K 4.2 03/31/2017 1150   CL 102 03/31/2017 1150   CO2 25 03/31/2017 1150   GLUCOSE 122 (H) 03/31/2017 1150   BUN 21 (H) 03/31/2017 1150   CREATININE 2.18 (H) 03/31/2017 1150   CALCIUM 9.3 03/31/2017 1150   PROT 6.0 (L) 03/31/2017 1150   ALBUMIN 3.5 03/31/2017 1150   AST 33 03/31/2017 1150   ALT 6 (L) 03/31/2017 1150   ALKPHOS 68 03/31/2017 1150   BILITOT 1.8 (H) 03/31/2017 1150   GFRNONAA 26 (L) 03/31/2017 1150   GFRAA 30 (L) 03/31/2017 1150   Urinalysis    Component Value Date/Time   COLORURINE YELLOW 03/31/2017 1501   APPEARANCEUR CLEAR 03/31/2017 1501   LABSPEC 1.009 03/31/2017 1501   PHURINE 5.0 03/31/2017 1501   GLUCOSEU NEGATIVE 03/31/2017 1501   HGBUR MODERATE (A)  03/31/2017 1501   BILIRUBINUR NEGATIVE 03/31/2017 1501   KETONESUR NEGATIVE 03/31/2017 1501   PROTEINUR NEGATIVE 03/31/2017 1501   NITRITE NEGATIVE 03/31/2017 1501   LEUKOCYTESUR NEGATIVE 03/31/2017 1501   BNP. 246.1 Troponin.0.01 Lipase. 17  EKG: Sinus rhythm with prolonged QTc at 579. No previous tracing to compare.  CXR: FINDINGS: Suboptimal inspiration accounts for crowded broncho vascular markings, especially in the bases, and accentuates the cardiac silhouette. Taking this into account, cardiac silhouette moderately enlarged. Thoracic aorta tortuous and atherosclerotic. Airspace consolidation in the right lower lobe. Minimal linear scar or atelectasis in the left base. Lungs otherwise clear. Pulmonary vascularity normal. No visible pleural effusions.  IMPRESSION: Suboptimal inspiration. Right lower lobe pneumonia. Linear scar atelectasis at the left lung base.  CT head without contrast. FINDINGS: Brain: There is some cortical atrophy and chronic microvascular ischemic change.  No evidence of acute intracranial abnormality including hemorrhage, infarct, mass lesion, mass effect, midline shift or abnormal extra-axial fluid collection. No hydrocephalus or pneumocephalus.  Vascular: Negative.  Skull: Intact.  Sinuses/Orbits: There is some mucosal thickening in the right maxillary sinus. Otherwise negative.  Other: None.  IMPRESSION: No acute abnormality.  Atrophy and chronic microvascular ischemic change.  Small mucous retention cysts or polyps right maxillary sinus.  CT abdomen and pelvis without contrast. FINDINGS: Multifactorial degradation, including lack of IV contrast, motion, overlying EKG leads and wires, and arm position, not raised above the head.  Lower chest: Right base scarring and/or atelectasis. Mild cardiomegaly. Lad coronary artery atherosclerosis. Trace right-sided pleural fluid or thickening.  Hepatobiliary: Grossly normal noncontrast  appearance of the liver. Gallbladder sludge or small stones without surrounding inflammation or gross biliary duct dilatation.  Pancreas: Pancreatic atrophy, without gross duct dilatation.  Spleen: Normal in size, without focal abnormality.  Adrenals/Urinary Tract: Normal adrenal glands. Mild renal cortical thinning bilaterally. Interpolar punctate right renal collecting system calculus. There is a probable punctate lower pole right renal collecting system stone as well.  Exophytic lower pole left renal 5.7 cm lesion is fluid density, likely a cyst.  Bilateral mild caliectasis. No hydroureter or convincing evidence of ureteric stone.  Moderate bladder distension. Multiple right-sided bladder stones which are all on the order of 4 mm or less.  There is left-sided perinephric fluid which is moderate volume, including on image 39/ series 5.  Stomach/Bowel: Proximal gastric underdistention. Stool ball in the rectum measures 6.3 cm. Mild perirectal edema. Large colonic stool burden more proximally. Normal terminal ileum and appendix. Normal small bowel.  Vascular/Lymphatic: Aortic and branch vessel atherosclerosis. No abdominopelvic adenopathy.  Reproductive: Normal prostate.  Other: No pelvic fluid.  There is trace perihepatic ascites.  Musculoskeletal: No acute osseous abnormality.  IMPRESSION: 1. Moderate-to-marked multifactorial degradation, as above. 2. Bladder distension with multiple bladder stones. Question a component of bladder outlet obstruction. 3. Bilateral mild caliectasis could be related to the extent of bladder distension. There is left-sided pararenal edema and fluid. This is nonspecific. This can be seen with caliceal rupture from recent stone passage or other causes of increased pressure. No obstructive stone identified. 4. Right nephrolithiasis. 5. Probable constipation and mild fecal impaction. 6. Small volume perihepatic ascites. 7.  Coronary artery  atherosclerosis. Aortic atherosclerosis.  Assessment & Plan by Problem:   Juris Gosnell is a 83 y.o. man with PMHx significant for Parkinson's , hypothyroidism, and paralysis agitans brought to ED via EMS with complaint of her worsening generalized weakness with urinary and  BM incontinent and confusion for last 3 days.  Altered mental status. Can be multifactorial, he does meet some criteria for sepsis including fever, leukocytosis, hypotension, AKI and hyper bilirubinemia. Check his lactic acid was normal and he was not acidotic on venous blood gas. CXR has Questionable  right lower lobe infiltrate- he does not has any upper respiratory symptoms, cough or chest pain-makes pneumonia less likely. His UA shows hematuria but was negative for any nitrates and bacteria. Leukocytosis can be because of his nephrolithiasis. Urine and blood cultures are pending. He got 2.25 L of normal saline bolus in the ED. -Admit to telemetry. -Nothing by mouth until his mental status improved. -Dextrose 5% with half saline-75 mL per hour for maintenance fluid. -He was given vancomycin and Zosyn in ED-we will de-escalate in the morning, most likely to ceftriaxone. -Repeat chest x-ray in the morning.  Nephrolithiasis with obstructive bladder stones. According to wife  patient has a history of nephrolithiasis and was treated with lithotripsy more than 10 years ago. He never had any complaints since then. His CT abdomen is consistent with nephrolithiasis along with bladder stone with bladder outlet obstruction-causing perinephric edema and fluid collection. -Foley catheter. -Urine culture -Urology consult in the morning.  AKI. Most likely because of his bladder outlet obstruction. He was having normal renal functions previously found on care everywhere. -Repeat CMP tomorrow morning.  Mild hyponatremia. On presentation to ED his sodium was 133. On his previous testing it remained in the 140s. Most likely  because of his poor by mouth intake. He already got 2.25 L of normal saline in ED. -We'll follow up on his CMP.  Parkinson's and history of paralysis agitans. He has history of freezing periodically. He does not exhibit any cogwheel rigidity on exam. Continue his home dose of Sinemet and Requip.  Hypothyroidism. We will continue his home dose of Synthroid at 88 MCG daily.  CODE STATUS. Full Diet. Nothing by mouth until his mental status improved. DVT prophylaxis. Lovenox  Dispo: Admit patient to Inpatient with expected length of stay greater than 2 midnights.  Signed: [name], MD 03/31/2017, 4:56 PM  Pager: [phone]

## 2017-03-31 NOTE — ED Notes (Signed)
3rd liter NS bolus infusing at this time

## 2017-04-01 ENCOUNTER — Inpatient Hospital Stay (HOSPITAL_COMMUNITY): Payer: Medicare Other

## 2017-04-01 ENCOUNTER — Encounter (HOSPITAL_COMMUNITY): Payer: Self-pay | Admitting: Student

## 2017-04-01 DIAGNOSIS — N210 Calculus in bladder: Secondary | ICD-10-CM

## 2017-04-01 DIAGNOSIS — E039 Hypothyroidism, unspecified: Secondary | ICD-10-CM

## 2017-04-01 DIAGNOSIS — N179 Acute kidney failure, unspecified: Secondary | ICD-10-CM

## 2017-04-01 DIAGNOSIS — L8990 Pressure ulcer of unspecified site, unspecified stage: Secondary | ICD-10-CM | POA: Insufficient documentation

## 2017-04-01 DIAGNOSIS — G9340 Encephalopathy, unspecified: Secondary | ICD-10-CM

## 2017-04-01 DIAGNOSIS — N200 Calculus of kidney: Secondary | ICD-10-CM

## 2017-04-01 DIAGNOSIS — G20 Parkinson's disease: Secondary | ICD-10-CM

## 2017-04-01 LAB — COMPREHENSIVE METABOLIC PANEL
ALT: 5 U/L — ABNORMAL LOW (ref 17–63)
AST: 23 U/L (ref 15–41)
Albumin: 2.8 g/dL — ABNORMAL LOW (ref 3.5–5.0)
Alkaline Phosphatase: 61 U/L (ref 38–126)
Anion gap: 4 — ABNORMAL LOW (ref 5–15)
BUN: 14 mg/dL (ref 6–20)
CHLORIDE: 110 mmol/L (ref 101–111)
CO2: 25 mmol/L (ref 22–32)
Calcium: 8.7 mg/dL — ABNORMAL LOW (ref 8.9–10.3)
Creatinine, Ser: 1.45 mg/dL — ABNORMAL HIGH (ref 0.61–1.24)
GFR calc Af Amer: 49 mL/min — ABNORMAL LOW (ref >60)
GFR calc non Af Amer: 43 mL/min — ABNORMAL LOW (ref >60)
Glucose, Bld: 91 mg/dL (ref 65–99)
Potassium: 3.9 mmol/L (ref 3.5–5.1)
SODIUM: 139 mmol/L (ref 135–145)
Total Bilirubin: 1.5 mg/dL — ABNORMAL HIGH (ref 0.3–1.2)
Total Protein: 5.4 g/dL — ABNORMAL LOW (ref 6.5–8.1)

## 2017-04-01 LAB — CBC
HEMATOCRIT: 32.4 % — AB (ref 39.0–52.0)
HEMOGLOBIN: 10.7 g/dL — AB (ref 13.0–17.0)
MCH: 30.1 pg (ref 26.0–34.0)
MCHC: 33 g/dL (ref 30.0–36.0)
MCV: 91.3 fL (ref 78.0–100.0)
Platelets: 132 10*3/uL — ABNORMAL LOW (ref 150–400)
RBC: 3.55 MIL/uL — ABNORMAL LOW (ref 4.22–5.81)
RDW: 14.6 % (ref 11.5–15.5)
WBC: 9 10*3/uL (ref 4.0–10.5)

## 2017-04-01 LAB — MRSA PCR SCREENING: MRSA by PCR: NEGATIVE

## 2017-04-01 MED ORDER — RESOURCE THICKENUP CLEAR PO POWD
ORAL | Status: DC | PRN
  Filled 2017-04-01: qty 125

## 2017-04-01 MED ORDER — AMPICILLIN-SULBACTAM SODIUM 1.5 (1-0.5) G IJ SOLR
1.5000 g | Freq: Three times a day (TID) | INTRAMUSCULAR | Status: DC
  Administered 2017-04-01 – 2017-04-02 (×4): 1.5 g via INTRAVENOUS
  Filled 2017-04-01 (×5): qty 1.5

## 2017-04-01 MED ORDER — TAMSULOSIN HCL 0.4 MG PO CAPS
0.4000 mg | ORAL_CAPSULE | Freq: Every day | ORAL | Status: DC
  Administered 2017-04-01 – 2017-04-03 (×3): 0.4 mg via ORAL
  Filled 2017-04-01 (×3): qty 1

## 2017-04-01 NOTE — Care Management Note (Signed)
Case Management Note  Patient Details  Name: Craig Giles MRN: [id number] Date of Birth: [birth year]  Subjective/Objective:        CM following for progression and d/c planning.             Action/Plan: 04/01/2017 Pt active with [facility] for [facility] services, [facility] aware of hospital admission.  Expected Discharge Date:  04/04/17               Expected Discharge Plan:  Home w Home Health Services  In-House Referral:  NA  Discharge planning Services  CM Consult  Post Acute Care Choice:    Choice offered to:  Patient, Spouse  DME Arranged:    DME Agency:     HH Arranged:  RN HH Agency:  [facility]  Status of Service:  In process, will continue to follow  If discussed at Long Length of Stay Meetings, dates discussed:    Additional Comments:  Starlyn Skeans, RN 04/01/2017, 11:59 AM

## 2017-04-01 NOTE — Progress Notes (Addendum)
Date: 04/01/2017  Patient name: Craig Giles  Medical record number: [id number]  Date of birth: 1931/03/08   I have seen and evaluated Craig Giles and discussed their care with the Residency Team. Craig Giles is an 83 year old man with history significant for Parkinson's disease and hypothyroidism. He presented with a chief complaint of progressive generalized weakness. The majority of the history was obtained from the patient's wife and son as the patient was confused. He had had a 3 day worsening of generalized weakness, urinary incontinence, fecal incontinence, abdominal pain, urinary frequency, urinary urgency, confusion, and increased reliance on family to complete ADLs. The EMS found the patient to be febrile to 102. He was also hypotensive at 80/60. He was placed on code sepsis in the ED.  This morning, the patient is much improved per the admitting team. The patient complains of not being able to eat or drink and all of the hassle he went through yesterday. He complains of supra-pubic fullness. His son indicated that the patient had difficulty chewing solid food but had no choking or coughing while eating soft foods. The son also stated he needed a straw for liquids.  PMHx, Fam Hx, and/or Soc Hx : He lives at home with his wife. He is a lifelong nonsmoker. There is bipolar and his brother and children.   Vitals:   04/01/17 0536 04/01/17 0914  BP: (!) 101/58 136/80  Pulse: 66 78  Resp: 17 18  Temp: 99.3 F (37.4 C) 98 F (36.7 C)  T max 99.4 Thin male in no acute distress Alert and oriented to person, place, and date of birth. Not oriented to time. Heart regular rate and rhythm with a systolic murmur LCTAB Abdomen positive bowel sounds soft minimal tenderness suprapubically. No fullness appreciated Extremities no edema  WBC 13.6 -- 9.0 HgB 12.2 - 10.7 Cr 2.18 - 1.45  The team and I independently reviewed his chest x-ray which was over penetrated and the patient was  rotated. There is loss of the right diaphragm with suggestion of right lower lobe infiltrate.  The team and I independently reviewed his EKG which showed sinus rhythm, normal axis, and no ischemic changes  CT scan showed bladder distention with multiple bladder stones. There was a question of bladder outlet obstruction. He also had right nephrolithiasis along with probable constipation and mild fecal impaction. There is also coronary artery and aortic atherosclerosis.  Assessment and Plan: I have seen and evaluated the patient as outlined above. I agree with the formulated Assessment and Plan as detailed in the residents' note, with the following changes: Craig Giles  is an 83 year old man who presented with a constellation of symptoms suggestive of sepsis. His urine did show too numerous to count red blood cells but no other indications of an infection. His chest x-ray was suggestive of a right lower lobe infiltrate but his symptoms lacked a pulmonary component and consisted of fever, confusion, weakness, and anorexia. We are going to simplify his antibiotic regimen to cover aspiration pneumonia and continue IV antibiotics for now although we hope to transition to oral in the morning. We are not pursuing a swallowing evaluation as they have not been evaluated in this patient population and have been shown to be harmful in regards to dehydration and lack of oral intake. We will resume his home diet per the son's instructions  1. Septic shock - the patient met this diagnosis on admission as he had a rise in his creatinine, fever, mental confusion, and hypotension. However it  was reversed very easily and patient seemed to be back at baseline the next hospital day. The most likely source seems to be pulmonary although his chest x-ray didn't show a definitive infiltrate. Since it is difficult to distinguish between bacterial aspiration pneumonia and a chemical pneumonitis, we will complete a course of oral  antibiotics at discharge.  2. Acute encephalopathy - secondary to septic shock. Seems to be resolved although we will check in with his family when they visit today.  3. Acute renal failure - secondary to septic shock or bladder obstruction. With IV fluids, his creatinine has trended down. He has now been switched from IV fluids to orals and we will recheck his creatinine in the morning. His baseline creatinine is between 1.0 and 1.2.  4. Nephrolithiasis with obstructive bladder stones - Dr Reesa Chew has consult to urology who has recommended a renal ultrasound to assess for hydronephrosis. This has been ordered.  5/1/20181:11 PM

## 2017-04-01 NOTE — Progress Notes (Signed)
Subjective: Patient was feeling much better this morning, more alert and oriented. He was complaining of some lower abdominal pain, was also complaining of what he is going through since yesterday. He wants to eat stating that he is feeling hungry. He was also asking for his wife-we were able to talk with his son and reassured patient that his family is going to visit him soon.  Objective:  Vital signs in last 24 hours: Vitals:   03/31/17 2131 03/31/17 2144 04/01/17 0536 04/01/17 0914  BP: 120/66  (!) 101/58 136/80  Pulse: 66  66 78  Resp: Temp: 99.3 F (37.4 C)  99.3 F (37.4 C) 98 F (36.7 C)  TempSrc:    Oral  SpO2: 97%  96% 98%  Weight:  146 lb 9.7 oz (66.5 kg)     Gen. Well-developed elderly man, alert and oriented to self and place but not to time, in no acute distress. Lungs. Clear bilaterally CV. Regular rate and rhythm with systolic murmur. Abdomen. Soft, mild suprapubic tenderness, bowel sounds positive. Extremities. No edema, no cyanosis, pulses intact and symmetrical.  Labs. CBC Latest Ref Rng & Units 04/01/2017 03/31/2017  WBC 4.0 - 10.5 K/uL 9.0 13.6(H)  Hemoglobin 13.0 - 17.0 g/dL 10.7(L) 12.2(L)  Hematocrit 39.0 - 52.0 % 32.4(L) 35.8(L)  Platelets 150 - 400 K/uL 132(L) 153   CMP Latest Ref Rng & Units 04/01/2017 03/31/2017  Glucose 65 - 99 mg/dL 91 161(W)  BUN 6 - 20 mg/dL 14 96(E)  Creatinine 4.54 - 1.24 mg/dL 0.98(J) 1.91(Y)  Sodium 135 - 145 mmol/L 139 133(L)  Potassium 3.5 - 5.1 mmol/L 3.9 4.2  Chloride 101 - 111 mmol/L 110 102  CO2 22 - 32 mmol/L 25 25  Calcium 8.9 - 10.3 mg/dL 7.8(G) 9.3  Total Protein 6.5 - 8.1 g/dL 9.5(A) 6.0(L)  Total Bilirubin 0.3 - 1.2 mg/dL 2.1(H) 0.8(M)  Alkaline Phos 38 - 126 U/L 61 68  AST 15 - 41 U/L 23 33  ALT 17 - 63 U/L 5(L) 6(L)   MRSA PCR screening. Negative Blood culture. No growth after 24 hour.  DG Chest. FINDINGS: The lungs are adequately inflated today. There remain coarse lung markings in the right  lower lobe. There is minimal blunting of the posterior costophrenic angles bilaterally. The heart is top-normal in size. The pulmonary vascularity is normal. There is tortuosity of the ascending and descending thoracic aorta with mural calcification in the arch. There is multilevel degenerative disc disease of the thoracic spine.  IMPRESSION: Persistent right lower lobe atelectasis or pneumonia with small bilateral pleural effusions layering posteriorly blunting the costophrenic angles. No CHF.  Thoracic aortic atherosclerosis.  Renal US. FINDINGS: Right Kidney:  Length: 11.9 cm. The hydronephrosis seen on yesterday's CT scan is no longer visualized. No definitive stones. A 3.2 cm cyst is seen in the upper pole.  Left Kidney:  Length: 14.4 cm. A 7.3 cm cyst is seen in the lower pole. The previously identified hydronephrosis on the left has resolved in the interval.  Bladder:  The bladder is decompressed with a Foley catheter.  IMPRESSION: 1. No hydronephrosis or stone seen on this study. Bilateral renal cysts are noted. 2. The bladder is decompressed with a Foley catheter. 3. Bilateral pleural effusions.  Assessment/Plan:  Craig Giles a 83 y.o.man with PMHx significant for Parkinson's , hypothyroidism, and paralysis agitans brought to ED via EMS with complaint of her worsening generalized weakness with urinary and  BM incontinent and  confusion for last 3 days.  Acute encephalopathy. Resolved today. Most likely because of urosepsis in the setting of bladder and kidney stones. He has no upper respiratory symptoms concerning for pneumonia. His son did not give [place] any history concerning for chronic aspiration. His repeat chest x-ray shows some chronic atelectasis and mild bilateral pleural effusion.  He remained afebrile and his leukocyte count decreased today. Blood culture remained negative after 24 hours. Urine culture still pending.  We ED escalate his  antibiotics to Unasyn. We will change his IV antibiotics to oral tomorrow morning to complete a 7 day course.  Nephrolithiasis with obstructive bladder stones. Urology was consulted because of urosepsis, AKI and bladder outlet obstruction on presentation-Dr. Marlou Porch advised to get a renal ultrasound to see hydronephrosis and he will follow-up in the afternoon.  His renal ultrasound shows resolution of hydronephrosis seen yesterday on renal CT scan-renal ultrasound was negative for any renal stones.renal ultrasound has low sensitivity of 54-57% in detecting stones as compared to CT which has a sensitivity of 88% and his CT was positive for right nephrolithiasis and stones in his bladder.  His bladder obstruction resolved with Foley.bladder was decompressed on renal ultrasound.his lower abdominal pain has been improved.  We will keep Foley-until he is cleared by urology. Urine culture results pending. -Monitor urine output for any passage of stones.  AKI. Improving. Creatinine still mildly elevated at 1.45 with baseline of 1. We expect further improvement as his hydronephrosis and obstruction is currently resolved. -Monitor BNP.  Mild hyponatremia. Resolved.  Parkinson's and history of paralysis agitans. -Continue his home dose of Sinemet and Requip.  Hypothyroidism. Continue his home dose of Synthroid.   Dispo: Anticipated discharge in approximately 1-2 day(s).   [name], MD 04/01/2017, 3:58 PM Pager: [phone]

## 2017-04-01 NOTE — Consult Note (Signed)
New Consult Note  Requesting Physician: Burns Spain, MD  Service Requesting Consult: Medicine  Urology Consult Attending: Marlou Porch Reason for Consult: Bladder outlet obstruction  Subjective: Craig Giles is seen in consultation for reasons noted above at the request of Burns Spain, MD on the internal medicine service.  This is a 83 year old patient with a history of Parkinson's, distant history of nephrolithiasis (lithotripsy >10 years ago) who presented with generalized weakness with decreased PO intake as well as confusion with urine and bowel incontinence. This occurred over the past 3 days prior to admission. He reports lower abdominal pain with urinary urgency and frequency. No fevers or chills but was reportedly febrile upon EMS arrival. He does report progressive difficulty urinating over some time, but had not sought treatment because he wasn't too concerned.   On arrival to the ED, he was hypotensive with leukocytosis and was not oriented. He had a CT scan showing a distended bladder with bladder stones as well as bilateral mild caliectasis likely secondary to the bladder distention. A foley catheter was placed with 900cc of immediate output. Prior to consult today a renal ultrasound was performed which showed resolution of any question of hydronephrosis and a decompressed bladder with foley in place. He is being treated with Unasyn for possible UTI but also possible pneumonia. His urine culture is pending. Blood cultures have been negative thus far. His creatinine has improved from 2.2 to 1.5 overnight and his leukocytosis resolved. He ha   Past Medical History: Past Medical History:  Diagnosis Date  . Hypothyroidism   . Memory deficits 10/22/2013  . Parkinson disease (HCC)   . 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 TONSILLECTOMY      Medication: Current  Facility-Administered Medications  Medication Dose Route Frequency Provider Last Rate Last Dose  . acetaminophen (TYLENOL) tablet 650 mg  650 mg Oral Q6H PRN John Giovanni, MD       Or  . acetaminophen (TYLENOL) suppository 650 mg  650 mg Rectal Q6H PRN John Giovanni, MD      . ampicillin-sulbactam (UNASYN) 1.5 g in sodium chloride 0.9 % 50 mL IVPB  1.5 g Intravenous Q8H Lauren D Bajbus, RPH   Stopped at 04/01/17 1708  . bisacodyl (DULCOLAX) suppository 10 mg  10 mg Rectal Daily PRN John Giovanni, MD      . carbidopa-levodopa (SINEMET IR) 25-100 MG per tablet immediate release 1 tablet  1 tablet Oral TID [name], MD   1 tablet at 04/01/17 1638  . enoxaparin (LOVENOX) injection 30 mg  30 mg Subcutaneous Q24H John Giovanni, MD   30 mg at 03/31/17 2231  . levothyroxine (SYNTHROID, LEVOTHROID) tablet 88 mcg  88 mcg Oral QAC breakfast [name], MD   88 mcg at 04/01/17 0843  . RESOURCE THICKENUP CLEAR   Oral PRN Burns Spain, MD      . rOPINIRole (REQUIP) tablet 5 mg  5 mg Oral TID [name], MD   5 mg at 04/01/17 1638    Allergies: No Known Allergies  Social History: Social History  Substance Use Topics  . Smoking status: Never Smoker  . Smokeless tobacco: Never Used  . Alcohol use No    Family History Family History  Problem Relation Age of Onset  . Diabetes Brother     Review of Systems 10 systems were reviewed and are negative except as noted specifically in the  HPI.  Objective: Vital signs in last 24 hours: BP 136/80 (BP Location: Right Arm)   Pulse 78   Temp 98 F (36.7 C) (Oral)   Resp 18   Wt 66.5 kg (146 lb 9.7 oz)   SpO2 98%   BMI 24.40 kg/m   Intake/Output last 3 shifts: I/O last 3 completed shifts: In: 4021.3 [I.V.:2871.3; IV Piggyback:1150] Out: 2475 [Urine:2475]  Physical Exam General: NAD, A&O, resting, appropriate HEENT: [employer]/AT, EOMI, MMM Pulmonary: Normal work of breathing on RA Cardiovascular: Regular rate & rhythm,  HDS, adequate peripheral perfusion Abdomen: soft, NTTP, nondistended, no suprapubic fullness or tenderness GU: Foley in place draining yellow urine, no CVA tenderness Extremities: warm and well perfused, no edema Neuro: Appropriate, no focal neurological deficits  Most Recent Labs: Lab Results  Component Value Date   WBC 9.0 04/01/2017   HGB 10.7 (L) 04/01/2017   HCT 32.4 (L) 04/01/2017   PLT 132 (L) 04/01/2017    Lab Results  Component Value Date   NA 139 04/01/2017   K 3.9 04/01/2017   CL 110 04/01/2017   CO2 25 04/01/2017   BUN 14 04/01/2017   CREATININE 1.45 (H) 04/01/2017   CALCIUM 8.7 (L) 04/01/2017    Lab Results  Component Value Date   ALKPHOS 61 04/01/2017   BILITOT 1.5 (H) 04/01/2017   PROT 5.4 (L) 04/01/2017   ALBUMIN 2.8 (L) 04/01/2017   ALT 5 (L) 04/01/2017   AST 23 04/01/2017    No results found for: INR, APTT  Urine Culture: Pending  IMAGING: Ct Abdomen Pelvis Wo Contrast  Result Date: 03/31/2017 CLINICAL DATA:  Fever. Confusion. Generalize weakness. Diarrhea. Abdominal pain. Parkinson's disease. EXAM: CT ABDOMEN AND PELVIS WITHOUT CONTRAST TECHNIQUE: Multidetector CT imaging of the abdomen and pelvis was performed following the standard protocol without IV contrast. COMPARISON:  None. FINDINGS: Multifactorial degradation, including lack of IV contrast, motion, overlying EKG leads and wires, and arm position, not raised above the head. Lower chest: Right base scarring and/or atelectasis. Mild cardiomegaly. Lad coronary artery atherosclerosis. Trace right-sided pleural fluid or thickening. Hepatobiliary: Grossly normal noncontrast appearance of the liver. Gallbladder sludge or small stones without surrounding inflammation or gross biliary duct dilatation. Pancreas: Pancreatic atrophy, without gross duct dilatation. Spleen: Normal in size, without focal abnormality. Adrenals/Urinary Tract: Normal adrenal glands. Mild renal cortical thinning bilaterally.  Interpolar punctate right renal collecting system calculus. There is a probable punctate lower pole right renal collecting system stone as well. Exophytic lower pole left renal 5.7 cm lesion is fluid density, likely a cyst. Bilateral mild caliectasis. No hydroureter or convincing evidence of ureteric stone. Moderate bladder distension. Multiple right-sided bladder stones which are all on the order of 4 mm or less. There is left-sided perinephric fluid which is moderate volume, including on image 39/ series 5. Stomach/Bowel: Proximal gastric underdistention. Stool ball in the rectum measures 6.3 cm. Mild perirectal edema. Large colonic stool burden more proximally. Normal terminal ileum and appendix. Normal small bowel. Vascular/Lymphatic: Aortic and branch vessel atherosclerosis. No abdominopelvic adenopathy. Reproductive: Normal prostate. Other: No pelvic fluid.  There is trace perihepatic ascites. Musculoskeletal: No acute osseous abnormality. IMPRESSION: 1. Moderate-to-marked multifactorial degradation, as above. 2. Bladder distension with multiple bladder stones. Question a component of bladder outlet obstruction. 3. Bilateral mild caliectasis could be related to the extent of bladder distension. There is left-sided pararenal edema and fluid. This is nonspecific. This can be seen with caliceal rupture from recent stone passage or other causes of increased pressure. No obstructive  stone identified. 4. Right nephrolithiasis. 5. Probable constipation and mild fecal impaction. 6. Small volume perihepatic ascites. 7.  Coronary artery atherosclerosis. Aortic atherosclerosis. Electronically Signed   By: Jeronimo Greaves M.D.   On: 03/31/2017 14:51   Dg Chest 2 View  Result Date: 04/01/2017 CLINICAL DATA:  Shortness of breath, pneumonia, Parkinson's disease. EXAM: CHEST  2 VIEW COMPARISON:  Portable chest x-ray of March 31, 2017 FINDINGS: The lungs are adequately inflated today. There remain coarse lung markings in the  right lower lobe. There is minimal blunting of the posterior costophrenic angles bilaterally. The heart is top-normal in size. The pulmonary vascularity is normal. There is tortuosity of the ascending and descending thoracic aorta with mural calcification in the arch. There is multilevel degenerative disc disease of the thoracic spine. IMPRESSION: Persistent right lower lobe atelectasis or pneumonia with small bilateral pleural effusions layering posteriorly blunting the costophrenic angles. No CHF. Thoracic aortic atherosclerosis. Electronically Signed   By: David  [place] M.D.   On: 04/01/2017 08:40   Ct Head Wo Contrast  Result Date: 03/31/2017 CLINICAL DATA:  Fever, confusion and generalized weakness today. EXAM: CT HEAD WITHOUT CONTRAST TECHNIQUE: Contiguous axial images were obtained from the base of the skull through the vertex without intravenous contrast. COMPARISON:  Head CT scan 05/29/2011. FINDINGS: Brain: There is some cortical atrophy and chronic microvascular ischemic change. No evidence of acute intracranial abnormality including hemorrhage, infarct, mass lesion, mass effect, midline shift or abnormal extra-axial fluid collection. No hydrocephalus or pneumocephalus. Vascular: Negative. Skull: Intact. Sinuses/Orbits: There is some mucosal thickening in the right maxillary sinus. Otherwise negative. Other: None. IMPRESSION: No acute abnormality. Atrophy and chronic microvascular ischemic change. Small mucous retention cysts or polyps right maxillary sinus. Electronically Signed   By: Drusilla Kanner M.D.   On: 03/31/2017 14:49   US Renal  Result Date: 04/01/2017 CLINICAL DATA:  Rule out bladder outlet obstruction. EXAM: RENAL / URINARY TRACT ULTRASOUND COMPLETE COMPARISON:  March 31, 2017 CT scan FINDINGS: Right Kidney: Length: 11.9 cm. The hydronephrosis seen on yesterday's CT scan is no longer visualized. No definitive stones. A 3.2 cm cyst is seen in the upper pole. Left Kidney: Length: 14.4  cm. A 7.3 cm cyst is seen in the lower pole. The previously identified hydronephrosis on the left has resolved in the interval. Bladder: The bladder is decompressed with a Foley catheter. IMPRESSION: 1. No hydronephrosis or stone seen on this study. Bilateral renal cysts are noted. 2. The bladder is decompressed with a Foley catheter. 3. Bilateral pleural effusions. Electronically Signed   By: Gerome Sam III M.D   On: 04/01/2017 13:29   Dg Chest Port 1 View  Result Date: 03/31/2017 CLINICAL DATA:  83 year old presenting with a 3 day history of abdominal pain, fever and hypotension. Nonsmoker. EXAM: PORTABLE CHEST 1 VIEW COMPARISON:  None. FINDINGS: Suboptimal inspiration accounts for crowded bronchovascular markings, especially in the bases, and accentuates the cardiac silhouette. Taking this into account, cardiac silhouette moderately enlarged. Thoracic aorta tortuous and atherosclerotic. Airspace consolidation in the right lower lobe. Minimal linear scar or atelectasis in the left base. Lungs otherwise clear. Pulmonary vascularity normal. No visible pleural effusions. IMPRESSION: Suboptimal inspiration. Right lower lobe pneumonia. Linear scar atelectasis at the left lung base. Electronically Signed   By: Hulan Saas M.D.   On: 03/31/2017 12:34     Assessment: Patient is a 83 y.o. male with history of Parkinson's and remote nephrolithiasis presenting with altered mental status likely contributed by bladder outlet obstruction  causing acute kidney injury. On presentation he had bilateral hydronephrosis and a distended bladder with possible ureteral versus bladder stones. After placement of foley, 900cc clear yellow urine drained. A renal ultrasound was ordered that demonstrated resolved hydronephrosis and decompressed bladder. Thus he likely has small non-obstructing bladder stones and no ureteral stones.   Given age and comorbidities, bladder outlet obstruction likely contributed by BPH and  Parkinson's. In any event, he has bladder stretch injury and requires a foley catheter for bladder rest for at least one week. Will plan on treated with alpha blocker now, ongoing bladder rest with foley, and clinic follow-up for trial of void.  Recommendations: 1. Continue foley catheter to drainage. As above, likely to require for at least 1 week for bladder rest. 2. Start Flomax 0.4mg  qHS daily and prescribe on discharge.  3. Will set him up for clinic follow-up at [facility] for trial of void as above.   Thank you for this consult. Will sign off. Please do not hesitate to contact us with any further questions/concerns.  Buck Mam, MD PGY4 Urology Resident

## 2017-04-01 NOTE — Progress Notes (Signed)
Pharmacy Antibiotic Note  Craig Giles is a 83 y.o. male admitted on 03/31/2017 with sepsis likely from PNA.  Pharmacy has been consulted for Unasyn dosing.  Patient has been afebrile for the last 24h, WBC now normal.  Plan: Unasyn 1.5g IV q8h F/u clinical progression, c/s, LOT, renal function  Weight: 146 lb 9.7 oz (66.5 kg)  Temp (24hrs), Avg:99.1 F (37.3 C), Min:98.2 F (36.8 C), Max:99.4 F (37.4 C)   Recent Labs Lab 03/31/17 1150 03/31/17 1216 04/01/17 0602  WBC 13.6*  --  9.0  CREATININE 2.18*  --  1.45*  LATICACIDVEN  --  1.68  --     Estimated Creatinine Clearance: 33 mL/min (A) (by C-G formula based on SCr of 1.45 mg/dL (H)).    No Known Allergies  Vanc 4/30>>5/1 Zosyn 4/30>>5/1 Unasyn 5/1>>  4/30 BCx: sent Urine: MRSA PCR:  Moussa Wiegand D. Siniya Lichty, PharmD, BCPS Clinical Pharmacist Pager: [phone] 04/01/2017 8:44 AM

## 2017-04-02 LAB — URINE CULTURE: Culture: NO GROWTH

## 2017-04-02 LAB — BASIC METABOLIC PANEL
Anion gap: 6 (ref 5–15)
BUN: 12 mg/dL (ref 6–20)
CO2: 25 mmol/L (ref 22–32)
CREATININE: 1.1 mg/dL (ref 0.61–1.24)
Calcium: 8.8 mg/dL — ABNORMAL LOW (ref 8.9–10.3)
Chloride: 105 mmol/L (ref 101–111)
GFR calc non Af Amer: 60 mL/min — ABNORMAL LOW (ref >60)
GLUCOSE: 87 mg/dL (ref 65–99)
Potassium: 3.8 mmol/L (ref 3.5–5.1)
Sodium: 136 mmol/L (ref 135–145)

## 2017-04-02 LAB — CBC
HCT: 33.5 % — ABNORMAL LOW (ref 39.0–52.0)
Hemoglobin: 10.9 g/dL — ABNORMAL LOW (ref 13.0–17.0)
MCH: 29 pg (ref 26.0–34.0)
MCHC: 32.5 g/dL (ref 30.0–36.0)
MCV: 89.1 fL (ref 78.0–100.0)
PLATELETS: 146 10*3/uL — AB (ref 150–400)
RBC: 3.76 MIL/uL — AB (ref 4.22–5.81)
RDW: 13.9 % (ref 11.5–15.5)
WBC: 7.8 10*3/uL (ref 4.0–10.5)

## 2017-04-02 MED ORDER — AMOXICILLIN-POT CLAVULANATE 500-125 MG PO TABS
1.0000 | ORAL_TABLET | Freq: Three times a day (TID) | ORAL | Status: DC
  Administered 2017-04-02 – 2017-04-03 (×4): 500 mg via ORAL
  Filled 2017-04-02 (×4): qty 1

## 2017-04-02 MED ORDER — ENOXAPARIN SODIUM 40 MG/0.4ML ~~LOC~~ SOLN
40.0000 mg | SUBCUTANEOUS | Status: DC
  Administered 2017-04-02: 40 mg via SUBCUTANEOUS
  Filled 2017-04-02: qty 0.4

## 2017-04-02 NOTE — Clinical Social Work Note (Signed)
CSW received SNF consult for patient. Talked with patient's wife and son at the bedside and assessment completed (full assessment to follow). Family agreeable to SNF. CSW will follow-up with family on 5/3 with facility responses.  Genelle Bal, MSW, LCSW Licensed Clinical Social Worker Clinical Social Work Department [employer] [phone]

## 2017-04-02 NOTE — Evaluation (Signed)
Physical Therapy Evaluation Patient Details Name: Craig Giles MRN: [id number] DOB: Dec 23, 1930 Today's Date: 04/02/2017   History of Present Illness  [name] is an 83 year old man with history significant for Parkinson's disease and hypothyroidism. He presented with a chief complaint of progressive generalized weakness. Working diagnoses of pneumonia and UTI  Clinical Impression  Pt admitted with above diagnosis. Pt currently with functional limitations due to the deficits listed below (see PT Problem List). Presents with functional dependencies, and significantly high fall risk; Recent functional decline;  Pt will benefit from skilled PT to increase their independence and safety with mobility to allow discharge to the venue listed below.       Follow Up Recommendations SNF    Equipment Recommendations  Rolling walker with 5" wheels;3in1 (PT) (may already have)    Recommendations for Other Services OT consult     Precautions / Restrictions Precautions Precautions: Fall Restrictions Weight Bearing Restrictions: No      Mobility  Bed Mobility Overal bed mobility: Needs Assistance Bed Mobility: Rolling;Sidelying to Sit Rolling: Min guard (used rail) Sidelying to sit: Min assist       General bed mobility comments: min  handheld assist to pull to sit; CUes for technique  Transfers Overall transfer level: Needs assistance Equipment used: Rolling walker (2 wheeled) Transfers: Sit to/from Stand Sit to Stand: Mod assist         General transfer comment: Mod assist to power up and help with weight shift anteriorly; posterior bias, bracing backs of LEs against bed for stability; decr control with stand to sit  Ambulation/Gait Ambulation/Gait assistance: Min assist;Mod assist Ambulation Distance (Feet): 50 Feet Assistive device: Rolling walker (2 wheeled) Gait Pattern/deviations: Decreased step length - right;Decreased step length - left;Trunk  flexed;Festinating;Decreased stride length Gait velocity: quite variable   General Gait Details: Tends to keep center of mass in front of feet, causing incr speed and considerably less control; min assist to help modulate speed for safety; longer steps in hallway where there is more space; much increased festination in smaller area of bathroom; more tendency to freeze in smaller areas and with turns  Stairs            Wheelchair Mobility    Modified Rankin (Stroke Patients Only)       Balance Overall balance assessment: Needs assistance   Sitting balance-Leahy Scale: Poor (approaching Fair)       Standing balance-Leahy Scale: Poor                               Pertinent Vitals/Pain Pain Assessment: No/denies pain    Home Living Family/patient expects to be discharged to:: Private residence Living Arrangements: Spouse/significant other Available Help at Discharge: Family;Available 24 hours/day Type of Home: Other(Comment) (Condominium) Home Access: Level entry;Elevator     Home Layout: One level Home Equipment: Walker - 2 wheels;Shower seat      Prior Function Level of Independence: Independent with assistive device(s)         Comments: Recently has needed RW for amb     Hand Dominance        Extremity/Trunk Assessment   Upper Extremity Assessment Upper Extremity Assessment: Generalized weakness    Lower Extremity Assessment Lower Extremity Assessment: Generalized weakness    Cervical / Trunk Assessment Cervical / Trunk Assessment: Kyphotic  Communication   Communication: No difficulties  Cognition Arousal/Alertness: Awake/alert Behavior During Therapy: WFL for tasks assessed/performed Overall Cognitive Status: History of  cognitive impairments - at baseline                                        General Comments General comments (skin integrity, edema, etc.): Noted darkened area at sacrum, likely related to  pressure; RN notified    Exercises Other Exercises Other Exercises: Gentle upper trunk extension coupled with inhalation   Assessment/Plan    PT Assessment Patient needs continued PT services  PT Problem List Decreased strength;Decreased range of motion;Decreased activity tolerance;Decreased balance;Decreased mobility;Decreased coordination;Decreased cognition;Decreased knowledge of use of DME;Decreased safety awareness;Decreased knowledge of precautions       PT Treatment Interventions DME instruction;Gait training;Functional mobility training;Therapeutic activities;Therapeutic exercise;Balance training;Neuromuscular re-education;Cognitive remediation;Patient/family education    PT Goals (Current goals can be found in the Care Plan section)  Acute Rehab PT Goals Patient Stated Goal: specific to this session: wanting to go to the bathroom PT Goal Formulation: With patient Time For Goal Achievement: 04/16/17 Potential to Achieve Goals: Good    Frequency 7X/week   Barriers to discharge        Co-evaluation               AM-PAC PT "6 Clicks" Daily Activity  Outcome Measure Difficulty turning over in bed (including adjusting bedclothes, sheets and blankets)?: Total Difficulty moving from lying on back to sitting on the side of the bed? : Total Difficulty sitting down on and standing up from a chair with arms (e.g., wheelchair, bedside commode, etc,.)?: Total Help needed moving to and from a bed to chair (including a wheelchair)?: A Lot Help needed walking in hospital room?: A Little Help needed climbing 3-5 steps with a railing? : A Lot 6 Click Score: 10    End of Session Equipment Utilized During Treatment: Gait belt Activity Tolerance: Patient tolerated treatment well Patient left: in chair;with call bell/phone within reach;with chair alarm set Nurse Communication: Mobility status;Other (comment) (monitor sacrum) PT Visit Diagnosis: Muscle weakness (generalized)  (M62.81);Ataxic gait (R26.0);Other symptoms and signs involving the nervous system (R29.898)    Time: 1355-1426 PT Time Calculation (min) (ACUTE ONLY): 31 min   Charges:   PT Evaluation $PT Eval Moderate Complexity: 1 Procedure PT Treatments $Gait Training: 8-22 mins   PT G Codes:        Van Clines, PT  Acute Rehabilitation Services Pager [phone] Office [phone]   Levi Aland 04/02/2017, 4:20 PM

## 2017-04-02 NOTE — NC FL2 (Signed)
[place] MEDICAID FL2 LEVEL OF CARE SCREENING TOOL     IDENTIFICATION  Patient Name: Craig Giles Birthdate: 08-30-1931 Sex: male Admission Date (Current Location): 03/31/2017  [facility] and [place] Number:  Producer, television/film/video and Address:  [facility], [address] [place], [place] [address]      Provider Number: [id number]  Attending Physician Name and Address:  Burns Spain, MD  Relative Name and Phone Number:  Arvie Bartholomew; [phone]    Current Level of Care: Hospital Recommended Level of Care: Skilled Nursing Facility Prior Approval Number:    Date Approved/Denied:   PASRR Number: [id number] (Eff. 04/02/17)  Discharge Plan: SNF    Current Diagnoses: Patient Active Problem List   Diagnosis Date Noted  . Pressure injury of skin 04/01/2017  . PNA (pneumonia) 03/31/2017  . Chronic insomnia 08/12/2016  . 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 Memory deficits 10/22/2013  . Abnormality of gait 11/10/2012  . Paralysis agitans (HCC) 11/10/2012    Orientation RESPIRATION BLADDER Height & Weight     Self, Place  Normal Continent Weight: 144 lb 2.9 oz (65.4 kg) Height:   (165.1 cm)  BEHAVIORAL SYMPTOMS/MOOD NEUROLOGICAL BOWEL NUTRITION STATUS      Incontinent (Urinary catheter for acute urinary retention) Diet (Regular diet)  AMBULATORY STATUS COMMUNICATION OF NEEDS Skin   Extensive Assist Verbally Other (Comment) (Stage 1 pressure injury to left and right heels with foam dressing)                       Personal Care Assistance Level of Assistance  Bathing, Feeding, Dressing Bathing Assistance: Limited assistance Feeding assistance: Independent Dressing Assistance: Limited assistance     Functional Limitations Info  Sight, Hearing, Speech Sight Info: Impaired Hearing Info: Impaired Speech Info: Adequate    SPECIAL CARE FACTORS FREQUENCY  PT (By licensed PT)     PT Frequency: Evaluated 5/2  with 7 days a week therapy recommended              Contractures Contractures Info: Not present    Additional Factors Info  Code Status, Allergies Code Status Info: Full Allergies Info: No known allergies           Current Medications (04/02/2017):  This is the current hospital active medication list Current Facility-Administered Medications  Medication Dose Route Frequency Provider Last Rate Last Dose  . acetaminophen (TYLENOL) tablet 650 mg  650 mg Oral Q6H PRN John Giovanni, MD       Or  . acetaminophen (TYLENOL) suppository 650 mg  650 mg Rectal Q6H PRN John Giovanni, MD      . amoxicillin-clavulanate (AUGMENTIN) 500-125 MG per tablet 500 mg  1 tablet Oral Q8H [name], MD   500 mg at 04/02/17 1604  . bisacodyl (DULCOLAX) suppository 10 mg  10 mg Rectal Daily PRN John Giovanni, MD      . carbidopa-levodopa (SINEMET IR) 25-100 MG per tablet immediate release 1 tablet  1 tablet Oral TID [name], MD   1 tablet at 04/02/17 1604  . enoxaparin (LOVENOX) injection 40 mg  40 mg Subcutaneous Q24H Lauren D Bajbus, RPH      . levothyroxine (SYNTHROID, LEVOTHROID) tablet 88 mcg  88 mcg Oral QAC breakfast [name], MD   88 mcg at 04/02/17 0844  . RESOURCE THICKENUP CLEAR   Oral PRN Burns Spain, MD      . rOPINIRole (REQUIP) tablet 5 mg  5  mg Oral TID [name], MD   5 mg at 04/02/17 1604  . tamsulosin (FLOMAX) capsule 0.4 mg  0.4 mg Oral Daily Burns Spain, MD   0.4 mg at 04/02/17 2725     Discharge Medications: Please see discharge summary for a list of discharge medications.  Relevant Imaging Results:  Relevant Lab Results:   Additional Information ss#[id number]. Patient has tremors  Okey Dupre, Lazaro Arms, LCSW

## 2017-04-02 NOTE — Progress Notes (Signed)
Empty pt's foley catheter and noted 2 renal stones that are about less than 5mm in size using a strainer.

## 2017-04-02 NOTE — Progress Notes (Signed)
  Date: 04/02/2017  Patient name: Craig Giles  Medical record number: [id number]  Date of birth: 1931-08-16   I have seen and evaluated this patient and I have discussed the plan of care with the house staff. Please see their note for complete details. I concur with their findings with the following additions/corrections: Craig Giles was seen on morning rounds. He was more sleepy today when compared to yesterday. However it was alert and oriented to person, place with prompting, and time. His mental status is at baseline per the family. His creatinine has returned to baseline. He is hemodynamically stable. There is a possibility that he had an aspiration pneumonia and we will treat with oral antibiotics for a short duration. We are waiting on physical therapy to make an evaluation to see if he needs a skilled nursing need as the family is having difficulty caring for him and his ADLs at home. Dispo to be determined once PT evals pt.  Burns Spain, MD 04/02/2017, 1:32 PM

## 2017-04-02 NOTE — Progress Notes (Signed)
Transitions of Care Pharmacy Note  Plan:  Educated on Augmentin, length of therapy and indication.  Addressed concerns regarding the purpose of the antibiotic.  --------------------------------------------- Craig Giles is an 83 y.o. male who presents with a chief complaint of worsening generalized weakness. In anticipation of discharge, pharmacy has reviewed this patient's prior to admission medication history, as well as current inpatient medications listed per the [facility].  Current medication indications, dosing, frequency, and notable side effects reviewed with patient. patient verbalized understanding of current inpatient medication regimen and is aware that the After Visit Summary when presented, will represent the most accurate medication list at discharge.   Craig Giles expressed concerns regarding why he was on an antibiotic. Explained that there was concern for an infection in his lungs and that his course of antibiotics should take care of it.    Assessment: Understanding of regimen: good Understanding of indications: good Potential of compliance: fair Barriers to Obtaining Medications: No  Patient instructed to contact inpatient pharmacy team with further questions or concerns if needed.    Time spent preparing for discharge counseling: 10 minutes  Time spent counseling patient: 10 minutes   Thank you for allowing pharmacy to be a part of this patient's care.  Hillis Range, PharmD PGY1 Pharmacy Resident Pager: [phone]

## 2017-04-02 NOTE — Evaluation (Addendum)
Occupational Therapy Evaluation Patient Details Name: Craig Giles MRN: [id number] DOB: 1931-01-07 Today's Date: 04/02/2017    History of Present Illness Craig Giles is an 83 year old man with history significant for Parkinson's disease and hypothyroidism. He presented with a chief complaint of progressive generalized weakness. Working diagnoses of pneumonia and UTI   Clinical Impression   PTA, pt was living with his wife and was performing his basic ADLs with some assistance from his wife. Currently, pt required continual Min A for for ADLs and functional mobility to maintain safety. Pt would benefit from continued skilled OT to increase his occupational performance and participation. Recommend dc to SNF for further OT to increase his safety and independence prior to transitioning home as well as decrease caregiver burden.    Follow Up Recommendations  SNF;Supervision/Assistance - 24 hour    Equipment Recommendations  Other (comment) (Defer to next venue)    Recommendations for Other Services       Precautions / Restrictions Precautions Precautions: Fall      Mobility Bed Mobility Overal bed mobility: Needs Assistance Bed Mobility: Rolling;Sidelying to Sit Rolling:  (used rail) Sidelying to sit: Min assist       General bed mobility comments: In recliner upon arrival.  Transfers Overall transfer level: Needs assistance Equipment used: Rolling walker (2 wheeled) Transfers: Sit to/from Stand Sit to Stand: Min assist         General transfer comment: Min A to maintain balance and gain stability in standing    Balance Overall balance assessment: Needs assistance Sitting-balance support: Feet supported;No upper extremity supported Sitting balance-Leahy Scale: Poor (approaching Fair) Sitting balance - Comments: Pt with tendency to slide down till buttom is off the chair surface   Standing balance support: No upper extremity supported;During functional  activity Standing balance-Leahy Scale: Poor Standing balance comment: Requires A for standing balance                           ADL either performed or assessed with clinical judgement   ADL Overall ADL's : Needs assistance/impaired Eating/Feeding: Set up;Sitting Eating/Feeding Details (indicate cue type and reason): In seated position, pt has tendency to slide down in chair Grooming: Oral care;Minimal assistance Grooming Details (indicate cue type and reason): Pt required Min A for balance at sink Upper Body Bathing: Minimal assistance;Sitting   Lower Body Bathing: Minimal assistance;Sit to/from stand   Upper Body Dressing : Minimal assistance;Sitting   Lower Body Dressing: Minimal assistance;Sit to/from stand Lower Body Dressing Details (indicate cue type and reason): Pt demonstrates good ROM to adjust socks Toilet Transfer: Minimal assistance;BSC;RW Toilet Transfer Details (indicate cue type and reason): Pt with urgency while at sink and had to bring [facility] for pt to sit<>stand for BM Toileting- Clothing Manipulation and Hygiene: Minimal assistance;Sit to/from stand Toileting - Clothing Manipulation Details (indicate cue type and reason): Pt performed toilet hygiene with Min A for standing balance and safety     Functional mobility during ADLs: Minimal assistance;Rolling walker (Pt demonstrates shiffled gait) General ADL Comments: Pt performing ADLs and functional mobility with Min A overall. However, requires this to [facility] safety and would benefit from rehab to increase safety and indpendent before transitioning home.     Vision         Perception     Praxis      Pertinent Vitals/Pain Pain Assessment: No/denies pain     Hand Dominance Right   Extremity/Trunk Assessment Upper Extremity Assessment Upper Extremity Assessment:  Generalized weakness   Lower Extremity Assessment Lower Extremity Assessment: Generalized weakness   Cervical / Trunk  Assessment Cervical / Trunk Assessment: Kyphotic   Communication Communication Communication: No difficulties   Cognition Arousal/Alertness: Awake/alert Behavior During Therapy: WFL for tasks assessed/performed Overall Cognitive Status: History of cognitive impairments - at baseline                                 General Comments: Pt demonstrated good problem solving and attention during grooming tasks. Some difficulty with terminiation of task but followed subtle VCs well   General Comments  Pt very nice throughout session    Exercises Exercises: Other exercises Other Exercises Other Exercises: Gentle upper trunk extension coupled with inhalation   Shoulder Instructions      Home Living Family/patient expects to be discharged to:: Private residence Living Arrangements: Spouse/significant other Available Help at Discharge: Family;Available 24 hours/day Type of Home: Other(Comment) (Condominium) Home Access: Level entry;Elevator     Home Layout: One level     Bathroom Shower/Tub: Chief Strategy Officer: Standard Bathroom Accessibility: Yes   Home Equipment: Walker - 2 wheels;Shower seat          Prior Functioning/Environment Level of Independence: Independent with assistive device(s)        Comments: Pt has been performing basic ADL with some A from wife        OT Problem List: Decreased strength;Decreased activity tolerance;Impaired balance (sitting and/or standing);Decreased range of motion;Decreased knowledge of use of DME or AE;Decreased safety awareness;Decreased cognition      OT Treatment/Interventions: Self-care/ADL training;Therapeutic exercise;Energy conservation;DME and/or AE instruction;Therapeutic activities;Patient/family education    OT Goals(Current goals can be found in the care plan section) Acute Rehab OT Goals Patient Stated Goal: Get better to go home OT Goal Formulation: With patient Time For Goal Achievement:  04/16/17 Potential to Achieve Goals: Good ADL Goals Pt Will Perform Grooming: with min guard assist;standing Pt Will Perform Upper Body Dressing: with min guard assist;sitting Pt Will Perform Lower Body Dressing: with min guard assist;sit to/from stand Pt Will Transfer to Toilet: with min guard assist;ambulating;bedside commode Pt Will Perform Toileting - Clothing Manipulation and hygiene: with min guard assist;sit to/from stand  OT Frequency: Min 2X/week   Barriers to D/C:            Co-evaluation              AM-PAC PT "6 Clicks" Daily Activity     Outcome Measure Help from another person eating meals?: A Little Help from another person taking care of personal grooming?: A Little Help from another person toileting, which includes using toliet, bedpan, or urinal?: A Little Help from another person bathing (including washing, rinsing, drying)?: A Lot Help from another person to put on and taking off regular upper body clothing?: A Little Help from another person to put on and taking off regular lower body clothing?: A Little 6 Click Score: 17   End of Session Equipment Utilized During Treatment: Gait belt;Rolling walker Nurse Communication: Mobility status  Activity Tolerance: Patient tolerated treatment well Patient left: in chair;with call bell/phone within reach;with chair alarm set  OT Visit Diagnosis: Unsteadiness on feet (R26.81);Muscle weakness (generalized) (M62.81);Other symptoms and signs involving cognitive function;Other abnormalities of gait and mobility (R26.89)                Time: 1610-9604 OT Time Calculation (min): 23 min Charges:  OT General Charges $OT Visit: 1 Procedure OT Evaluation $OT Eval Low Complexity: 1 Procedure OT Treatments $Self Care/Home Management : 23-37 mins G-Codes:     [employer], OTR/L [phone]  Theodoro Grist Letecia Arps 04/02/2017, 6:00 PM

## 2017-04-02 NOTE — Progress Notes (Signed)
Subjective: Craig Giles is an 83 yo man with a PMHx significant for Parkinson's, Hypothyroidism, and renal stones that were treated with lithotripsy 10 years ago.  He presented to the ED complaining of confusion generalized weakness, and urinary and bowel movement incontinence and was found to have a bladder stones causing an obstruction.    This morning the patient was drowsy and initially confused and unable to answer questions with intelligible sentences.  However, after a few minutes he became less drowsy and was able to answer questions.  The patient reported feeling better since admission but still complains of diffuse abdominal pain and some nausea.  He continues to have generalized weakness and says he is not able to get out of bed.  He does not report any issues with breathing.  His wife and son came by this morning and verified that the mental status observed by the team was consistent with the patient's baseline status at home.  The family also reported concerns about taking care of the patient in his current state and wanted to pursue getting the patient into a rehab facility/SNIF until he regained his strength and mobility.  His wife previously had a stay at [facility] and [facility] and was wondering if her husband might be able to go there.     Objective: Vital signs in last 24 hours: Vitals:   04/01/17 0914 04/01/17 2040 04/02/17 0510 04/02/17 0930  BP: 136/80 114/62 119/89 96/70  Pulse: 78 66 (!) 58 (!) 58  Resp: '18 19 20   '$ Temp: 98 F (36.7 C) 98.3 F (36.8 C) 98.5 F (36.9 C) 99.1 F (37.3 C)  TempSrc: Oral Oral Oral Oral  SpO2: 98% 98% 97% 97%  Weight:  65.4 kg (144 lb 2.9 oz)    Height:  '5\' 5"'$  (1.651 m)     Weight change: -2.64 kg (-5 lb 13.1 oz)  Intake/Output Summary (Last 24 hours) at 04/02/17 1113 Last data filed at 04/02/17 1006  Gross per 24 hour  Intake              690 ml  Output             1850 ml  Net            -1160 ml   General  appearance: slowed mentation Back: no tenderness to percussion or palpation, symmetric, no curvature. ROM normal. No CVA tenderness. Lungs: wheezes base - right Heart: regular rate and rhythm, S1, S2 normal, no murmur, click, rub or gallop Abdomen: soft, non-tender; bowel sounds normal; no masses,  no organomegaly Extremities: no edema, redness or tenderness in the calves or thighs Pulses: 2+ and symmetric Neurologic: Mental status: alertness: obtunded, orientation: date, person, place, city, president, though this flucuates throughout the day and throughout the exam   Lab Results: Results for Craig Giles, Craig Giles (MRN [id number]) as of 04/02/2017 11:33  Ref. Range 04/02/2017 69:67  BASIC METABOLIC PANEL Unknown Rpt (A)  Sodium Latest Ref Range: 135 - 145 mmol/L 136  Potassium Latest Ref Range: 3.5 - 5.1 mmol/L 3.8  Chloride Latest Ref Range: 101 - 111 mmol/L 105  CO2 Latest Ref Range: 22 - 32 mmol/L 25  Glucose Latest Ref Range: 65 - 99 mg/dL 87  BUN Latest Ref Range: 6 - 20 mg/dL 12  Creatinine Latest Ref Range: 0.61 - 1.24 mg/dL 1.10  Calcium Latest Ref Range: 8.9 - 10.3 mg/dL 8.8 (L)  Anion gap Latest Ref Range: 5 - 15  6  EGFR (  African American) Latest Ref Range: >60 mL/min >60  EGFR (Non-African Amer.) Latest Ref Range: >60 mL/min 60 (L)  WBC Latest Ref Range: 4.0 - 10.5 K/uL 7.8  RBC Latest Ref Range: 4.22 - 5.81 MIL/uL 3.76 (L)  Hemoglobin Latest Ref Range: 13.0 - 17.0 g/dL 10.9 (L)  HCT Latest Ref Range: 39.0 - 52.0 % 33.5 (L)  MCV Latest Ref Range: 78.0 - 100.0 fL 89.1  MCH Latest Ref Range: 26.0 - 34.0 pg 29.0  MCHC Latest Ref Range: 30.0 - 36.0 g/dL 32.5  RDW Latest Ref Range: 11.5 - 15.5 % 13.9  Platelets Latest Ref Range: 150 - 400 K/uL 146 (L)   '@LABTEST2'$ @  Micro Results: Recent Results (from the past 240 hour(s))  Blood Culture (routine x 2)     Status: None (Preliminary result)   Collection Time: 03/31/17 12:22 PM  Result Value Ref Range Status   Specimen  Description BLOOD RIGHT ANTECUBITAL  Final   Special Requests   Final    BOTTLES DRAWN AEROBIC AND ANAEROBIC Blood Culture adequate volume   Culture NO GROWTH 2 DAYS  Final   Report Status PENDING  Incomplete  Blood Culture (routine x 2)     Status: None (Preliminary result)   Collection Time: 03/31/17 12:30 PM  Result Value Ref Range Status   Specimen Description BLOOD RIGHT ARM  Final   Special Requests   Final    BOTTLES DRAWN AEROBIC AND ANAEROBIC Blood Culture adequate volume   Culture NO GROWTH 2 DAYS  Final   Report Status PENDING  Incomplete  Urine culture     Status: None   Collection Time: 03/31/17  3:01 PM  Result Value Ref Range Status   Specimen Description URINE, RANDOM  Final   Special Requests NONE  Final   Culture NO GROWTH  Final   Report Status 04/02/2017 FINAL  Final  MRSA PCR Screening     Status: None   Collection Time: 04/01/17  8:51 AM  Result Value Ref Range Status   MRSA by PCR NEGATIVE NEGATIVE Final    Comment:        The GeneXpert MRSA Assay (FDA approved for NASAL specimens only), is one component of a comprehensive MRSA colonization surveillance program. It is not intended to diagnose MRSA infection nor to guide or monitor treatment for MRSA infections.    Studies/Results: Ct Abdomen Pelvis Wo Contrast  Result Date: 03/31/2017 CLINICAL DATA:  Fever. Confusion. Generalize weakness. Diarrhea. Abdominal pain. Parkinson's disease. EXAM: CT ABDOMEN AND PELVIS WITHOUT CONTRAST TECHNIQUE: Multidetector CT imaging of the abdomen and pelvis was performed following the standard protocol without IV contrast. COMPARISON:  None. FINDINGS: Multifactorial degradation, including lack of IV contrast, motion, overlying EKG leads and wires, and arm position, not raised above the head. Lower chest: Right base scarring and/or atelectasis. Mild cardiomegaly. Lad coronary artery atherosclerosis. Trace right-sided pleural fluid or thickening. Hepatobiliary: Grossly  normal noncontrast appearance of the liver. Gallbladder sludge or small stones without surrounding inflammation or gross biliary duct dilatation. Pancreas: Pancreatic atrophy, without gross duct dilatation. Spleen: Normal in size, without focal abnormality. Adrenals/Urinary Tract: Normal adrenal glands. Mild renal cortical thinning bilaterally. Interpolar punctate right renal collecting system calculus. There is a probable punctate lower pole right renal collecting system stone as well. Exophytic lower pole left renal 5.7 cm lesion is fluid density, likely a cyst. Bilateral mild caliectasis. No hydroureter or convincing evidence of ureteric stone. Moderate bladder distension. Multiple right-sided bladder stones which are all on the  order of 4 mm or less. There is left-sided perinephric fluid which is moderate volume, including on image 39/ series 5. Stomach/Bowel: Proximal gastric underdistention. Stool ball in the rectum measures 6.3 cm. Mild perirectal edema. Large colonic stool burden more proximally. Normal terminal ileum and appendix. Normal small bowel. Vascular/Lymphatic: Aortic and branch vessel atherosclerosis. No abdominopelvic adenopathy. Reproductive: Normal prostate. Other: No pelvic fluid.  There is trace perihepatic ascites. Musculoskeletal: No acute osseous abnormality. IMPRESSION: 1. Moderate-to-marked multifactorial degradation, as above. 2. Bladder distension with multiple bladder stones. Question a component of bladder outlet obstruction. 3. Bilateral mild caliectasis could be related to the extent of bladder distension. There is left-sided pararenal edema and fluid. This is nonspecific. This can be seen with caliceal rupture from recent stone passage or other causes of increased pressure. No obstructive stone identified. 4. Right nephrolithiasis. 5. Probable constipation and mild fecal impaction. 6. Small volume perihepatic ascites. 7.  Coronary artery atherosclerosis. Aortic atherosclerosis.  Electronically Signed   By: Abigail Miyamoto M.D.   On: 03/31/2017 14:51   Dg Chest 2 View  Result Date: 04/01/2017 CLINICAL DATA:  Shortness of breath, pneumonia, Parkinson's disease. EXAM: CHEST  2 VIEW COMPARISON:  Portable chest x-ray of March 31, 2017 FINDINGS: The lungs are adequately inflated today. There remain coarse lung markings in the right lower lobe. There is minimal blunting of the posterior costophrenic angles bilaterally. The heart is top-normal in size. The pulmonary vascularity is normal. There is tortuosity of the ascending and descending thoracic aorta with mural calcification in the arch. There is multilevel degenerative disc disease of the thoracic spine. IMPRESSION: Persistent right lower lobe atelectasis or pneumonia with small bilateral pleural effusions layering posteriorly blunting the costophrenic angles. No CHF. Thoracic aortic atherosclerosis. Electronically Signed   By: David  Martinique M.D.   On: 04/01/2017 08:40   Ct Head Wo Contrast  Result Date: 03/31/2017 CLINICAL DATA:  Fever, confusion and generalized weakness today. EXAM: CT HEAD WITHOUT CONTRAST TECHNIQUE: Contiguous axial images were obtained from the base of the skull through the vertex without intravenous contrast. COMPARISON:  Head CT scan 05/29/2011. FINDINGS: Brain: There is some cortical atrophy and chronic microvascular ischemic change. No evidence of acute intracranial abnormality including hemorrhage, infarct, mass lesion, mass effect, midline shift or abnormal extra-axial fluid collection. No hydrocephalus or pneumocephalus. Vascular: Negative. Skull: Intact. Sinuses/Orbits: There is some mucosal thickening in the right maxillary sinus. Otherwise negative. Other: None. IMPRESSION: No acute abnormality. Atrophy and chronic microvascular ischemic change. Small mucous retention cysts or polyps right maxillary sinus. Electronically Signed   By: Inge Rise M.D.   On: 03/31/2017 14:49   US Renal  Result Date:  04/01/2017 CLINICAL DATA:  Rule out bladder outlet obstruction. EXAM: RENAL / URINARY TRACT ULTRASOUND COMPLETE COMPARISON:  March 31, 2017 CT scan FINDINGS: Right Kidney: Length: 11.9 cm. The hydronephrosis seen on yesterday's CT scan is no longer visualized. No definitive stones. A 3.2 cm cyst is seen in the upper pole. Left Kidney: Length: 14.4 cm. A 7.3 cm cyst is seen in the lower pole. The previously identified hydronephrosis on the left has resolved in the interval. Bladder: The bladder is decompressed with a Foley catheter. IMPRESSION: 1. No hydronephrosis or stone seen on this study. Bilateral renal cysts are noted. 2. The bladder is decompressed with a Foley catheter. 3. Bilateral pleural effusions. Electronically Signed   By: Dorise Bullion III M.D   On: 04/01/2017 13:29   Dg Chest Port 1 View  Result Date:  03/31/2017 CLINICAL DATA:  83 year old presenting with a 3 day history of abdominal pain, fever and hypotension. Nonsmoker. EXAM: PORTABLE CHEST 1 VIEW COMPARISON:  None. FINDINGS: Suboptimal inspiration accounts for crowded bronchovascular markings, especially in the bases, and accentuates the cardiac silhouette. Taking this into account, cardiac silhouette moderately enlarged. Thoracic aorta tortuous and atherosclerotic. Airspace consolidation in the right lower lobe. Minimal linear scar or atelectasis in the left base. Lungs otherwise clear. Pulmonary vascularity normal. No visible pleural effusions. IMPRESSION: Suboptimal inspiration. Right lower lobe pneumonia. Linear scar atelectasis at the left lung base. Electronically Signed   By: Evangeline Dakin M.D.   On: 03/31/2017 12:34   Medications:  Prior to Admission:  Prescriptions Prior to Admission  Medication Sig Dispense Refill Last Dose  . acetaminophen (TYLENOL) 325 MG tablet Take 650 mg by mouth every 6 (six) hours as needed for mild pain.   Past Week at Unknown time  . Ascorbic Acid (VITAMIN C) 100 MG tablet Take 100 mg by mouth  daily.   03/30/2017 at Unknown time  . carbidopa-levodopa (SINEMET IR) 25-100 MG tablet Take 1 tablet by mouth 3 (three) times daily. 270 tablet 3 03/31/2017 at Unknown time  . levothyroxine (SYNTHROID, LEVOTHROID) 88 MCG tablet Take 88 mcg by mouth daily.   03/31/2017 at Unknown time  . Multiple Vitamin (MULTIVITAMIN) tablet Take 1 tablet by mouth daily.   03/30/2017 at Unknown time  . Multiple Vitamins-Minerals (ZINC PO) Take 1 tablet by mouth daily.   03/30/2017 at Unknown time  . polyethylene glycol (MIRALAX / GLYCOLAX) packet Take 17 g by mouth daily as needed for mild constipation.   Past Week at Unknown time  . ropinirole (REQUIP) 5 MG tablet Take 1 tablet (5 mg total) by mouth 3 (three) times daily. 270 tablet 1 03/31/2017 at Unknown time  . traZODone (DESYREL) 100 MG tablet Take 1 tablet (100 mg total) by mouth at bedtime. 30 tablet 5 03/30/2017 at Unknown time   Scheduled: . carbidopa-levodopa  1 tablet Oral TID  . enoxaparin (LOVENOX) injection  40 mg Subcutaneous Q24H  . levothyroxine  88 mcg Oral QAC breakfast  . ropinirole  5 mg Oral TID  . tamsulosin  0.4 mg Oral Daily   Continuous: . ampicillin-sulbactam (UNASYN) IV Stopped (04/02/17 1021)   SFK:[id number] **OR** acetaminophen, bisacodyl, RESOURCE THICKENUP CLEAR Anti-infectives    Start     Dose/Rate Route Frequency Ordered Stop   04/02/17 0800  vancomycin (VANCOCIN) IVPB 1000 mg/200 mL premix  Status:  Discontinued     1,000 mg 200 mL/hr over 60 Minutes Intravenous Every 48 hours 03/31/17 1350 04/01/17 0834   04/01/17 0930  ampicillin-sulbactam (UNASYN) 1.5 g in sodium chloride 0.9 % 50 mL IVPB     1.5 g 100 mL/hr over 30 Minutes Intravenous Every 8 hours 04/01/17 0845     03/31/17 2200  piperacillin-tazobactam (ZOSYN) IVPB 3.375 g  Status:  Discontinued     3.375 g 12.5 mL/hr over 240 Minutes Intravenous Every 8 hours 03/31/17 1350 04/01/17 0834   03/31/17 1245  vancomycin (VANCOCIN) 1,500 mg in sodium chloride 0.9 % 500  mL IVPB     1,500 mg 250 mL/hr over 120 Minutes Intravenous  Once 03/31/17 1236 03/31/17 1649   03/31/17 1215  piperacillin-tazobactam (ZOSYN) IVPB 3.375 g     3.375 g 100 mL/hr over 30 Minutes Intravenous  Once 03/31/17 1210 03/31/17 1341   03/31/17 1215  vancomycin (VANCOCIN) IVPB 1000 mg/200 mL premix  Status:  Discontinued  1,000 mg 200 mL/hr over 60 Minutes Intravenous  Once 03/31/17 1210 03/31/17 1236     Scheduled Meds: . carbidopa-levodopa  1 tablet Oral TID  . enoxaparin (LOVENOX) injection  40 mg Subcutaneous Q24H  . levothyroxine  88 mcg Oral QAC breakfast  . ropinirole  5 mg Oral TID  . tamsulosin  0.4 mg Oral Daily   Continuous Infusions: . ampicillin-sulbactam (UNASYN) IV Stopped (04/02/17 1021)   PRN Meds:.acetaminophen **OR** acetaminophen, bisacodyl, RESOURCE THICKENUP CLEAR  Assessment/Plan: Craig Giles is an 83 yo man with a PMHx significant for Parkinson's, Hypothyroidism, and renal stones that were treated with lithotripsy who presented to the ED via EMS with complaint of her worsening generalized weakness with urinary and BMincontinent and confusion for last 3 days.    Altered Mental Status: His fever and change in mental status was initially thought to be due  to urosepsis, but the negative urine culture makes this less likely. It is possible these symptoms are associated with the pain and stress from his bladder obstruction. However, his symtoms seem to have resolved since yesterday and  his current mental status is most likely due to underlying dementia as well as his presence in the abnormal environment of the hospital.  This is confirmed by the absence of leukocytosis or fever, as well as the family's report that his baseline status at home mirrors his current state in the hospital.      -We will d/c his current IV UNASYN  Nephrolithiasis/ Bladder Stones/ Bladder Obstruction: Urology will keep the Foley catheter in and monitor stone status. His  creatinine is now at 1.1 indicating any renal dysfunction caused by the bladder obstruction has likely been resolved.    Parkinson's and history of paralysis agitans: -Continue his home dose of Sinemet and Requip.  Hypothyroidism: Continue his home dose of Synthroid.   Dispo: Anticipated discharge in approximately 1-2 day(s).  -Since his mobility continues to be an issue, PT will need to get involved to assess his capacity to go home or, more likely, need to be referred to a rehab facility or SNF.  The wife would like to see if the patient can go to [facility] [employer] or [facility]   This is a Careers information officer Note.  The care of the patient was discussed with Dr. Soundra Pilon and Dr. Lynnae January and the assessment and plan formulated with their assistance.  Please see their attached note for official documentation of the daily encounter.   LOS: 2 days   Brendolyn Patty, Medical Student 04/02/2017, 11:13 AM

## 2017-04-02 NOTE — Progress Notes (Signed)
   Subjective: Patient was feeling much better  Today. According to his wife and son he is back to his baseline. He was still feeling weak and unable to walk without assistance. Wife was very concerned that she might not be able to take care of him alone at home, she was asking if he cannot be sent to a rehabilitation facility to regain some of his strength back.  Objective:  Vital signs in last 24 hours: Vitals:   04/01/17 0914 04/01/17 2040 04/02/17 0510 04/02/17 0930  BP: 136/80 114/62 119/89 96/70  Pulse: 78 66 (!) 58 (!) 58  Resp: Temp: 98 F (36.7 C) 98.3 F (36.8 C) 98.5 F (36.9 C) 99.1 F (37.3 C)  TempSrc: Oral Oral Oral Oral  SpO2: 98% 98% 97% 97%  Weight:  144 lb 2.9 oz (65.4 kg)    Height:   (1.651 m)     Gen. Well-developed elderly man, alert and oriented , in no acute distress. Lungs. Clear bilaterally CV. Regular rate and rhythm with systolic murmur. Abdomen. Soft, mild suprapubic tenderness, bowel sounds positive. Extremities. No edema, no cyanosis, pulses intact and symmetrical.  Labs.  Urine culture  Order: [id number]  Status:  Final result  Visible to patient:  No (Not Released)  Next appt:  06/02/2017 at 10:00 AM in Neurology Lesly Dukes, MD)   2d ago  Specimen Description URINE, RANDOM   Special Requests NONE   Culture NO GROWTH   Report Status 04/02/2017 FINAL        Assessment/Plan:  Craig Giles a 83 y.o.man with PMHx significant for Parkinson's ,hypothyroidism,and paralysis agitans brought to ED via EMS with complaint of her worsening generalized weakness with urinary and BMincontinent and confusion for last 3 days.  Acute encephalopathy. Resolved-Patient is back to his baseline according to his family. His leukocytosis has been improved and renal function returned to normal. His blood and urine cultures both negative. There was some possibility of aspiration pneumonitis. -Switch Unasyn to  Augmentin. We will complete a total 5 days of antibiotic-today is day 3.  Nephrolithiasis with obstructive bladder stones. He is improving, urine culture was negative. We will keep Foley in for 1 week per Urology to help recover him from his  bladder injury. He will follow-up with urology in one week as an outpatient for further management of his nephrolithiasis.  AKI. Resolved.  Parkinson's and history of paralysis agitans. -Continue his home dose of Sinemet and Requip.  Hypothyroidism. Continue his home dose of Synthroid.  Dispo: Anticipated discharge in approximately 1 day(s). Depending on bed availability at a rehabilitation facility.  [name], MD 04/02/2017, 1:20 PM Pager: [phone]

## 2017-04-03 DIAGNOSIS — F5104 Psychophysiologic insomnia: Secondary | ICD-10-CM | POA: Diagnosis not present

## 2017-04-03 DIAGNOSIS — N139 Obstructive and reflux uropathy, unspecified: Secondary | ICD-10-CM | POA: Diagnosis not present

## 2017-04-03 DIAGNOSIS — E039 Hypothyroidism, unspecified: Secondary | ICD-10-CM | POA: Diagnosis not present

## 2017-04-03 DIAGNOSIS — G20 Parkinson's disease: Secondary | ICD-10-CM | POA: Diagnosis not present

## 2017-04-03 DIAGNOSIS — J690 Pneumonitis due to inhalation of food and vomit: Secondary | ICD-10-CM

## 2017-04-03 DIAGNOSIS — N200 Calculus of kidney: Secondary | ICD-10-CM | POA: Diagnosis not present

## 2017-04-03 DIAGNOSIS — J189 Pneumonia, unspecified organism: Secondary | ICD-10-CM | POA: Diagnosis not present

## 2017-04-03 DIAGNOSIS — R339 Retention of urine, unspecified: Secondary | ICD-10-CM | POA: Diagnosis not present

## 2017-04-03 DIAGNOSIS — R498 Other voice and resonance disorders: Secondary | ICD-10-CM | POA: Diagnosis not present

## 2017-04-03 DIAGNOSIS — N210 Calculus in bladder: Secondary | ICD-10-CM | POA: Diagnosis not present

## 2017-04-03 DIAGNOSIS — R278 Other lack of coordination: Secondary | ICD-10-CM | POA: Diagnosis not present

## 2017-04-03 DIAGNOSIS — N320 Bladder-neck obstruction: Secondary | ICD-10-CM

## 2017-04-03 DIAGNOSIS — J80 Acute respiratory distress syndrome: Secondary | ICD-10-CM | POA: Diagnosis not present

## 2017-04-03 DIAGNOSIS — N136 Pyonephrosis: Secondary | ICD-10-CM | POA: Diagnosis not present

## 2017-04-03 DIAGNOSIS — R1311 Dysphagia, oral phase: Secondary | ICD-10-CM | POA: Diagnosis not present

## 2017-04-03 DIAGNOSIS — G9340 Encephalopathy, unspecified: Secondary | ICD-10-CM | POA: Diagnosis not present

## 2017-04-03 MED ORDER — TAMSULOSIN HCL 0.4 MG PO CAPS: 0.4000 mg | ORAL_CAPSULE | Freq: Every day | ORAL | 0 refills | Status: DC

## 2017-04-03 MED ORDER — AMOXICILLIN-POT CLAVULANATE 500-125 MG PO TABS: 1.0000 | ORAL_TABLET | Freq: Three times a day (TID) | ORAL | 0 refills | Status: AC

## 2017-04-03 NOTE — Clinical Social Work Placement (Addendum)
   CLINICAL SOCIAL WORK PLACEMENT  NOTE 04/03/17 - [facility].  **SEE ADDITIONAL COMMENTS BELOW FOR INSURANCE AUTHORIZATION INFORMATION.  Date:  04/03/2017  Patient Details  Name: Craig Giles MRN: [id number] Date of Birth: 11/27/1931  Clinical Social Work is seeking post-discharge placement for this patient at the Skilled  Nursing Facility level of care (*CSW will initial, date and re-position this form in  chart as items are completed):  Yes   Patient/family provided with [place] Clinical Social Work Department's list of facilities offering this level of care within the geographic area requested by the patient (or if unable, by the patient's family).  Yes   Patient/family informed of their freedom to choose among providers that offer the needed level of care, that participate in Medicare, Medicaid or managed care program needed by the patient, have an available bed and are willing to accept the patient.  Yes   Patient/family informed of [facility]'s ownership interest in [facility] and [facility], as well as of the fact that they are under no obligation to receive care at these facilities.  PASRR submitted to EDS on 04/02/17     PASRR number received on 04/02/17     Existing PASRR number confirmed on       FL2 transmitted to all facilities in geographic area requested by pt/family on 04/02/17     FL2 transmitted to all facilities within larger geographic area on       Patient informed that his/her managed care company has contracts with or will negotiate with certain facilities, including the following:        Yes   Patient/family informed of bed offers received.  [facility]     Physician recommends and patient chooses bed at      [facility].  Patient to be transferred to facility by Ambulance Sharin Mons([facility])     Patient family  notified on 04/03/17 of transfer.  Name of family member notified:  Wife Kaylyn LayerRoseann and son Franky MachoRaymond, Jr. at the bedside     PHYSICIAN      Additional Comment:  04/03/17 - Received insurance authorization: Auth. Information rec'd from [employer] Simpson with [employer].  Auth number - [id number] eff. 5/3 for 3 days. RUG Level is RVB and May 5th is next review date. Therapy minutes - 565 per week. SIC is Evette GeorgesSaryia Yan Theng and fax # to send her clinicals is [phone].  Jamie with [facility] contacted 5/3 at 12:32 pm and proivded with insurance auth. information.   _______________________________________________ Cristobal Goldmannrawford, Nadelyn Enriques Bradley, LCSW 04/03/2017, 12:57 PM

## 2017-04-03 NOTE — Progress Notes (Addendum)
Attempted to call for report again, no answer, left message to Burna MortimerWanda, the receptionist, to call [phone] for any questions. Also notified them that patient is on his way to their facility via [facility].  [facility], no answer. Will try again.

## 2017-04-03 NOTE — Progress Notes (Signed)
   Subjective: Patient was feeling better and since this morning. He had few questions regarding continuation of his Foley which were all answered. He was willing to go to a rehabilitation facility.  Objective:  Vital signs in last 24 hours: Vitals:   04/02/17 1900 04/02/17 2100 04/03/17 0440 04/03/17 1014  BP:  (!) 107/44 111/64 102/62  Pulse:  65 (!) 49 (!) 59  Resp:   18 18  Temp:  99.2 F (37.3 C) 97.9 F (36.6 C) 98.1 F (36.7 C)  TempSrc:  Oral Oral Oral  SpO2:  96% 97% 98%  Weight: 137 lb (62.1 kg)     Height:       Gen.Well-developed elderly man, alert and oriented ,in no acute distress. CV. RRR,  systolic murmur. Lungs. Clear bilaterally, no added sound. Extremities. No edema, no cyanosis, pulses intact and symmetrical.  Assessment/Plan:  Craig Giles a 83 y.o.man with PMHx significant for Parkinson's ,hypothyroidism,and paralysis agitans brought to ED via EMS with complaint of her worsening generalized weakness with urinary and BMincontinent and confusion for last 3 days.  Nephrolithiasis with obstructive bladder stones.  Patient remained symptom-free. He will follow-up with urology within a week. He will be discharged with catheter in place.  Aspiration pneumonitis. Because of his initial presentation being febrile, leukocytosis and questionable RLL infiltrate-we decided to complete a course of antibiotic. We will continue Augmentin for 2 more days.  Acute encephalopathy.Resolved.  AKI. Resolved.  Parkinson's and history of paralysis agitans. -Continue his home dose of Sinemet and Requip.  Hypothyroidism. Continue his home dose of Synthroid.  Dispo: Can be discharged today depending on SNF bed availability.   Arnetha CourserSumayya Raychell Holcomb, MD 04/03/2017, 11:19 AM Pager: [phone]

## 2017-04-03 NOTE — Discharge Instructions (Signed)
It was pleasure taking care of you. Please complete your antibiotic course for 5 more doses. Please go for your appointment with [facility] on 04/10/2017 at 8:30 AM. You are being discharged with catheter, and it should stay in place until you see your urologist.

## 2017-04-03 NOTE — Discharge Summary (Signed)
Name: Craig Giles MRN: [id number] DOB: [birth year] 83 y.o. PCP: Willow Ora, MD  Date of Admission: 03/31/2017 10:46 AM Date of Discharge: 04/03/2017 Attending Physician: Burns Spain, MD  Discharge Diagnosis: 1. Right nephrolithiasis with bladder stones causing bladder outlet obstruction. 2. Aspiration pneumonitis.  Discharge Medications: Allergies as of 04/03/2017   No Known Allergies     Medication List    TAKE these medications   acetaminophen 325 MG tablet Commonly known as:  TYLENOL Take 650 mg by mouth every 6 (six) hours as needed for mild pain.   amoxicillin-clavulanate 500-125 MG tablet Commonly known as:  AUGMENTIN Take 1 tablet (500 mg total) by mouth every 8 (eight) hours.   carbidopa-levodopa 25-100 MG tablet Commonly known as:  SINEMET IR Take 1 tablet by mouth 3 (three) times daily.   levothyroxine 88 MCG tablet Commonly known as:  SYNTHROID, LEVOTHROID Take 88 mcg by mouth daily.   multivitamin tablet Take 1 tablet by mouth daily.   polyethylene glycol packet Commonly known as:  MIRALAX / GLYCOLAX Take 17 g by mouth daily as needed for mild constipation.   ropinirole 5 MG tablet Commonly known as:  REQUIP Take 1 tablet (5 mg total) by mouth 3 (three) times daily.   tamsulosin 0.4 MG Caps capsule Commonly known as:  FLOMAX Take 1 capsule (0.4 mg total) by mouth daily. Start taking on:  04/04/2017   traZODone 100 MG tablet Commonly known as:  DESYREL Take 1 tablet (100 mg total) by mouth at bedtime.   vitamin C 100 MG tablet Take 100 mg by mouth daily.   ZINC PO Take 1 tablet by mouth daily.       Disposition and follow-up:   Craig Giles was discharged from [facility] in Good condition.  At the hospital follow up visit please address:  1.  Patient is being discharged with Foley.  2.  Labs / imaging needed at time of follow-up: None  3.  Pending labs/ test needing follow-up: None  Follow-up  Appointments: Follow-up Information    [facility] Go on 04/10/2017.   Why:  At 8:30 AM. Contact information: [address]  [place] [address] [place] [address] [phone]           Hospital Course by problem list:  Spero Gunnels a 83 y.o.man with PMHx significant for Parkinson's , hypothyroidism, and paralysis agitans brought to ED via EMS with complaint of her worsening generalized weakness with urinary and  BM incontinent and confusion for last 3 days. On arrival to ED he was hypotensive at 79/58, temperature of 99.4, saturating well on room air and oriented only to self- On lab was having leukocytosis at 13.6, mild hyponatremia, hyperbilirubinemia, AK I and UA shows hematuria and CT shows multiple obstructing bladder stones and right nephrolithiasis,CXR was also concerning for right lower lobe infiltrate versus atelectasis.  Acute encephalopathy. Most likely due to urosepsis with bladder outlet obstruction due to multiple bladder stones, although his urine culture came back negative and UA was not consistent with any infectious etiology and was positive for on the hematuria. His chest x-ray was not concerning for any pneumonia and he did not have any upper respiratory symptoms, he was treated with antibiotics because of his initial septic picture. There was some concern of chronic silent aspiration because of his advanced Parkinson's. We advised him to complete his course of Unasyn followed by Augmentin for 5 days.  His encephalopathy resolved next day, he  was afebrile and leukocytosis improved, his renal functions normalized and according to patient's family he was back to his baseline.  Nephrolithiasis with obstructive bladder stones. On presentation he was having very distended bladder with hydronephrosis and perinephric edema. His abdominal pain resolved with Foley and decompression of bladder. Renal ultrasound was done next day as advised by Dr. Marlou Porch from  urology, shows resolution of hydronephrosis. According to Dr. Jasmine Awe advice he will be discharged with Foley in place, and will follow-up with [facility] on May 10 at 8:30 AM for further management.  AKI. He had acute renal injury on presentation with creatinine of 2.18.It resolved with hydration and bladder decompression with Foley. On discharge his creatinine was 1.1.  Parkinson's and history of paralysis agitans. He follow-up with neurology. He does not has any acute exacerbation or freezing episodes during his current hospitalization. We continued his home dose of Sinemet and Requip.  Hypothyroidism. TSH within normal limit. Continue his home dose of Synthroid.  Discharge Vitals:   BP 102/62 (BP Location: Left Arm)   Pulse (!) 59   Temp 98.1 F (36.7 C) (Oral)   Resp 18   Ht 5\' 5"  (1.651 m)   Wt 137 lb (62.1 kg)   SpO2 98%   BMI 22.80 kg/m   Gen.Well-developed elderly man, alert and oriented ,in no acute distress. CV. RRR,  systolic murmur. Lungs. Clear bilaterally, no added sound. Extremities. No edema, no cyanosis, pulses intact and symmetrical.  Pertinent Labs, Studies, and Procedures:  CBC Latest Ref Rng & Units 04/02/2017 04/01/2017 03/31/2017  WBC 4.0 - 10.5 K/uL 7.8 9.0 13.6(H)  Hemoglobin 13.0 - 17.0 g/dL 10.9(L) 10.7(L) 12.2(L)  Hematocrit 39.0 - 52.0 % 33.5(L) 32.4(L) 35.8(L)  Platelets 150 - 400 K/uL 146(L) 132(L) 153   CMP Latest Ref Rng & Units 04/02/2017 04/01/2017 03/31/2017  Glucose 65 - 99 mg/dL 87 91 161(W)  BUN 6 - 20 mg/dL 12 14 96(E)  Creatinine 0.61 - 1.24 mg/dL 4.54 0.98(J) 1.91(Y)  Sodium 135 - 145 mmol/L 136 139 133(L)  Potassium 3.5 - 5.1 mmol/L 3.8 3.9 4.2  Chloride 101 - 111 mmol/L 105 110 102  CO2 22 - 32 mmol/L 25 25 25   Calcium 8.9 - 10.3 mg/dL 7.8(G) 9.5(A) 9.3  Total Protein 6.5 - 8.1 g/dL - 5.4(L) 6.0(L)  Total Bilirubin 0.3 - 1.2 mg/dL - 1.5(H) 1.8(H)  Alkaline Phos 38 - 126 U/L - 61 68  AST 15 - 41 U/L - 23 33  ALT 17 - 63 U/L -  5(L) 6(L)   Urinalysis    Component Value Date/Time   COLORURINE YELLOW 03/31/2017 1501   APPEARANCEUR CLEAR 03/31/2017 1501   LABSPEC 1.009 03/31/2017 1501   PHURINE 5.0 03/31/2017 1501   GLUCOSEU NEGATIVE 03/31/2017 1501   HGBUR MODERATE (A) 03/31/2017 1501   BILIRUBINUR NEGATIVE 03/31/2017 1501   KETONESUR NEGATIVE 03/31/2017 1501   PROTEINUR NEGATIVE 03/31/2017 1501   NITRITE NEGATIVE 03/31/2017 1501   LEUKOCYTESUR NEGATIVE 03/31/2017 1501   Urine culture  Order: [id number]  Status:  Final result Visible to patient:  No (Not Released) Next appt:  06/02/2017 at 10:00 AM in Neurology Lesly Dukes, MD)   3d ago  Specimen Description URINE, RANDOM   Special Requests NONE   Culture NO GROWTH   Report Status 04/02/2017 FINAL        Blood Culture (routine x 2)  Order: [id number]  Status:  Preliminary result Visible to patient:  No (Not Released) Next appt:  06/02/2017 at 10:00  AM in Neurology Lesly Dukes, MD)  Newer results are available. Click to view them now.   3d ago  Specimen Description BLOOD RIGHT ARM   Special Requests BOTTLES DRAWN AEROBIC AND ANAEROBIC Blood Culture adequate volume   Culture NO GROWTH 3 DAYS        Lactic Acid. 1.68 Troponin.0.01 TSH. 3.023 BNP. 246.1 Lipase. 17  EKG: Sinus rhythm with prolonged QTc at 579. No previous tracing to compare.  CXR:  03/31/17 FINDINGS: Suboptimal inspiration accounts for crowded broncho vascular markings, especially in the bases, and accentuates the cardiac silhouette. Taking this into account, cardiac silhouette moderately enlarged. Thoracic aorta tortuous and atherosclerotic. Airspace consolidation in the right lower lobe. Minimal linear scar or atelectasis in the left base. Lungs otherwise clear. Pulmonary vascularity normal. No visible pleural effusions.  IMPRESSION: Suboptimal inspiration. Right lower lobe pneumonia. Linear scar atelectasis at the left lung base.  CXR.  04/01/17 FINDINGS: The lungs are adequately inflated today. There remain coarse lung markings in the right lower lobe. There is minimal blunting of the posterior costophrenic angles bilaterally. The heart is top-normal in size. The pulmonary vascularity is normal. There is tortuosity of the ascending and descending thoracic aorta with mural calcification in the arch. There is multilevel degenerative disc disease of the thoracic spine.  IMPRESSION: Persistent right lower lobe atelectasis or pneumonia with small bilateral pleural effusions layering posteriorly blunting the costophrenic angles. No CHF.  Thoracic aortic atherosclerosis.  CT head without contrast. FINDINGS: Brain: There is some cortical atrophy and chronic microvascular ischemic change. No evidence of acute intracranial abnormality including hemorrhage, infarct, mass lesion, mass effect, midline shift or abnormal extra-axial fluid collection. No hydrocephalus or pneumocephalus.  Vascular: Negative.  Skull: Intact.  Sinuses/Orbits: There is some mucosal thickening in the right maxillary sinus. Otherwise negative.  Other: None.  IMPRESSION: No acute abnormality.  Atrophy and chronic microvascular ischemic change.  Small mucous retention cysts or polyps right maxillary sinus.  CT abdomen and pelvis without contrast. FINDINGS: Multifactorial degradation, including lack of IV contrast, motion, overlying EKG leads and wires, and arm position, not raised above the head.  Lower chest: Right base scarring and/or atelectasis. Mild cardiomegaly. Lad coronary artery atherosclerosis. Trace right-sided pleural fluid or thickening.  Hepatobiliary: Grossly normal noncontrast appearance of the liver. Gallbladder sludge or small stones without surrounding inflammation or gross biliary duct dilatation.  Pancreas: Pancreatic atrophy, without gross duct dilatation.  Spleen: Normal in size, without focal  abnormality.  Adrenals/Urinary Tract: Normal adrenal glands. Mild renal cortical thinning bilaterally. Interpolar punctate right renal collecting system calculus. There is a probable punctate lower pole right renal collecting system stone as well.  Exophytic lower pole left renal 5.7 cm lesion is fluid density, likely a cyst.  Bilateral mild caliectasis. No hydroureter or convincing evidence of ureteric stone.  Moderate bladder distension. Multiple right-sided bladder stones which are all on the order of 4 mm or less.  There is left-sided perinephric fluid which is moderate volume, including on image 39/ series 5.  Stomach/Bowel: Proximal gastric underdistention. Stool ball in the rectum measures 6.3 cm. Mild perirectal edema. Large colonic stool burden more proximally. Normal terminal ileum and appendix. Normal small bowel.  Vascular/Lymphatic: Aortic and branch vessel atherosclerosis. No abdominopelvic adenopathy.  Reproductive: Normal prostate.  Other: No pelvic fluid. There is trace perihepatic ascites.  Musculoskeletal: No acute osseous abnormality.  IMPRESSION: 1. Moderate-to-marked multifactorial degradation, as above. 2. Bladder distension with multiple bladder stones. Question a component of bladder outlet obstruction. 3. Bilateral  mild caliectasis could be related to the extent of bladder distension. There is left-sided pararenal edema and fluid. This is nonspecific. This can be seen with caliceal rupture from recent stone passage or other causes of increased pressure. No obstructive stone identified. 4. Right nephrolithiasis. 5. Probable constipation and mild fecal impaction. 6. Small volume perihepatic ascites. 7. Coronary artery atherosclerosis. Aortic atherosclerosis.  US Renal. COMPARISON:  March 31, 2017 CT scan  FINDINGS: Right Kidney:  Length: 11.9 cm. The hydronephrosis seen on yesterday's CT scan is no longer visualized. No definitive stones. A  3.2 cm cyst is seen in the upper pole.  Left Kidney:  Length: 14.4 cm. A 7.3 cm cyst is seen in the lower pole. The previously identified hydronephrosis on the left has resolved in the interval.  Bladder:  The bladder is decompressed with a Foley catheter.  IMPRESSION: 1. No hydronephrosis or stone seen on this study. Bilateral renal  cysts are noted. 2. The bladder is decompressed with a Foley catheter. 3. Bilateral pleural effusions.  Discharge Instructions: Discharge Instructions    Diet - low sodium heart healthy    Complete by:  As directed    Discharge instructions    Complete by:  As directed    It was pleasure taking care of you. Please complete your antibiotic course for 5 more doses. Please go for your appointment with [facility] on 04/10/2017 at 8:30 AM. You are being discharged with catheter, and it should stay in place until you see your urologist.   Increase activity slowly    Complete by:  As directed       Signed: Arnetha CourserSumayya Calie Buttrey, MD 04/03/2017, 1:39 PM   Pager: [phone]

## 2017-04-03 NOTE — Care Management Note (Signed)
Case Management Note  Patient Details  Name: Craig Giles MRN: [id number] Date of Birth: 08/02/1931  Subjective/Objective:                 Patient with order to DC to SNF.   Action/Plan:  Will DC to SNF today as facilitated by CSW.   Expected Discharge Date:  04/03/17               Expected Discharge Plan:  Skilled Nursing Facility  In-House Referral:  Clinical Social Work  Discharge planning Services  CM Consult  Post Acute Care Choice:    Choice offered to:     DME Arranged:    DME Agency:     HH Arranged:    HH Agency:     Status of Service:  Completed, signed off  If discussed at [employer] Meetings, dates discussed:    Additional Comments:  Lawerance SabalDebbie Shiven Junious, RN 04/03/2017, 1:46 PM

## 2017-04-03 NOTE — Clinical Social Work Note (Signed)
Clinical Social Work Assessment  Patient Details  Name: Craig Giles MRN: [id number] Date of Birth: 09/17/1931  Date of referral:  04/02/17               Reason for consult:  Facility Placement                Permission sought to share information with:  Family Supports Permission granted to share information::  No (Patient allowed his wife and daughter to talk with CSW on 5/2 regarding discharge planning)  Name::     Craig Giles and Group 1 Automotiveaymond Giles, AllisonJr.  Agency::     Relationship::  Wife and son  Contact Information:  Wife - [phone] and Shari HeritageSon - [phone]  Housing/Transportation Living arrangements for the past 2 months:  Apartment Source of Information:  Spouse Patient Interpreter Needed:  None Criminal Activity/Legal Involvement Pertinent to Current Situation/Hospitalization:  No - Comment as needed Significant Relationships:  Adult Children, Spouse Lives with:  Spouse Do you feel safe going back to the place where you live?  No (Wife understands and agrees that patient needs rehab before returning home) Need for family participation in patient care:  Yes (Comment)  Care giving concerns: Wife reports that ST rehab needed as she cannot care for patient at home as he won't cooperate.   Social Worker assessment / plan:  CSW talked with patient's wife and son Craig SalvoRaymond on 5/2 at the bedside regarding discharge disposition for patient. Craig Giles was sitting in a chair and bedside and was quiet during the conversation. Craig Giles reported that her husband has never been to a facility for ST rehab, however she has. [facility] as [facility] is where she had rehab and pleased with the care and therapy. CSW advised by son that he is the oldest child and lives about 10 minutes from his parents. His siblings live out of state [place] from [place].   CSW explained facility search process and [facility] [place] [facility] and  their questions were addressed.    CSW visited again with family on 5/3 to talk with them about insurance authorization and [facility] pending insurance authorization. Craig Giles was again sitting up in a chair and was more alert and interacted with CSW during visit. [facility] at 2:30 pm.  Employment status:  Retired Health and safety inspectornsurance information:  Public librarianManaged Medicare (BCBS Medicare) PT Recommendations:  Skilled Nursing Facility Information / Referral to community resources:  Skilled Nursing Facility (Family provided with SNF list during assessment on 04/02/17)  Patient/Family's Response to care:  No concerns expressed by family regarding patient's care during hospitalization.  Patient/Family's Understanding of and Emotional Response to Diagnosis, Current Treatment, and Prognosis: Not discussed.  Emotional Assessment Appearance:  Appears stated age Attitude/Demeanor/Rapport:  Other (Quiet on 5/2 and more alert and talkative on 5/3) Affect (typically observed):  Quiet, Appropriate, Pleasant Orientation:  Oriented to Self, Oriented to Place Alcohol / Substance use:  Tobacco Use, Alcohol Use, Illicit Drugs (Patient reports that he does not smoke, drinks occasionally and does not use illicit drugs) Psych involvement (Current and /or in the community):  No (Comment)  Discharge Needs  Concerns to be addressed:  Discharge Planning Concerns Readmission within the last 30 days:  No Current discharge risk:  None Barriers to Discharge:  No Barriers Identified   Cristobal GoldmannCrawford, Asma Boldon Bradley, LCSW 04/03/2017, 12:45 PM

## 2017-04-03 NOTE — Progress Notes (Signed)
Subjective: [name] is an 83 yo man with a PMHx significant for Parkinson's, Hypothyroidism, and renal stones that were treated with lithotripsy 10 years ago.  He presented to the ED complaining of confusion generalized weakness, and urinary and bowel movement incontinence and was found to have a bladder stones causing an obstruction.  The patient was more alert this morning, with his eyes open and responding to questions.  We discussed his visit with PT and he understood and agreed with their recommendation to transfer to a SNF.  The patient was briefly unaware of where he was, but quickly corrected himself, showing a marked improvement in mental status from previous days.  He reports no pain, increased work of breathing, or nausea.  He does not remember if he had a bowel movement yesterday.  He had questions about whether his foley catheter which were answered.   Objective: Vital signs in last 24 hours: Vitals:   04/03/17 0440 04/03/17 0933 04/03/17 1014 04/03/17 1411  BP: 111/64 103/61 102/62 (!) 100/59  Pulse: (!) 49 62 (!) 59 63  Resp: 18 18 18 18   Temp: 97.9 F (36.6 C) 98.1 F (36.7 C) 98.1 F (36.7 C) 98.3 F (36.8 C)  TempSrc: Oral Oral Oral Oral  SpO2: 97% 96% 98% 96%  Weight:      Height:       Weight change: -3.257 kg (-7 lb 2.9 oz)  Intake/Output Summary (Last 24 hours) at 04/03/17 1416 Last data filed at 04/03/17 1035  Gross per 24 hour  Intake              600 ml  Output             1302 ml  Net             -702 ml   General appearance: alert, cooperative, no distress and slowed mentation Lungs: clear to auscultation bilaterally Heart: regular rate and rhythm, S1, S2 normal, no murmur, click, rub or gallop Abdomen: soft, non-tender; bowel sounds normal; no masses,  no organomegaly Extremities: extremities normal, atraumatic, no cyanosis or edema Neurologic: Grossly normal Lab Results: CMP Latest Ref Rng & Units 04/02/2017 04/01/2017 03/31/2017  Glucose 65 - 99  mg/dL 87 91 308(M122(H)  BUN 6 - 20 mg/dL 12 14 57(Q21(H)  Creatinine 0.61 - 1.24 mg/dL 4.691.10 6.29(B1.45(H) 2.84(X2.18(H)  Sodium 135 - 145 mmol/L 136 139 133(L)  Potassium 3.5 - 5.1 mmol/L 3.8 3.9 4.2  Chloride 101 - 111 mmol/L 105 110 102  CO2 22 - 32 mmol/L 25 25 25   Calcium 8.9 - 10.3 mg/dL 3.2(G8.8(L) 4.0(N8.7(L) 9.3  Total Protein 6.5 - 8.1 g/dL - 5.4(L) 6.0(L)  Total Bilirubin 0.3 - 1.2 mg/dL - 1.5(H) 1.8(H)  Alkaline Phos 38 - 126 U/L - 61 68  AST 15 - 41 U/L - 23 33  ALT 17 - 63 U/L - 5(L) 6(L)   CBC Latest Ref Rng & Units 04/02/2017 04/01/2017 03/31/2017  WBC 4.0 - 10.5 K/uL 7.8 9.0 13.6(H)  Hemoglobin 13.0 - 17.0 g/dL 10.9(L) 10.7(L) 12.2(L)  Hematocrit 39.0 - 52.0 % 33.5(L) 32.4(L) 35.8(L)  Platelets 150 - 400 K/uL 146(L) 132(L) 153     Micro Results: Recent Results (from the past 240 hour(s))  Blood Culture (routine x 2)     Status: None (Preliminary result)   Collection Time: 03/31/17 12:22 PM  Result Value Ref Range Status   Specimen Description BLOOD RIGHT ANTECUBITAL  Final   Special Requests   Final  BOTTLES DRAWN AEROBIC AND ANAEROBIC Blood Culture adequate volume   Culture NO GROWTH 3 DAYS  Final   Report Status PENDING  Incomplete  Blood Culture (routine x 2)     Status: None (Preliminary result)   Collection Time: 03/31/17 12:30 PM  Result Value Ref Range Status   Specimen Description BLOOD RIGHT ARM  Final   Special Requests   Final    BOTTLES DRAWN AEROBIC AND ANAEROBIC Blood Culture adequate volume   Culture NO GROWTH 3 DAYS  Final   Report Status PENDING  Incomplete  Urine culture     Status: None   Collection Time: 03/31/17  3:01 PM  Result Value Ref Range Status   Specimen Description URINE, RANDOM  Final   Special Requests NONE  Final   Culture NO GROWTH  Final   Report Status 04/02/2017 FINAL  Final  MRSA PCR Screening     Status: None   Collection Time: 04/01/17  8:51 AM  Result Value Ref Range Status   MRSA by PCR NEGATIVE NEGATIVE Final    Comment:        The  GeneXpert MRSA Assay (FDA approved for NASAL specimens only), is one component of a comprehensive MRSA colonization surveillance program. It is not intended to diagnose MRSA infection nor to guide or monitor treatment for MRSA infections.    Medications: I have reviewed the patient's current medications. Scheduled Meds: . amoxicillin-clavulanate  1 tablet Oral Q8H  . carbidopa-levodopa  1 tablet Oral TID  . enoxaparin (LOVENOX) injection  40 mg Subcutaneous Q24H  . levothyroxine  88 mcg Oral QAC breakfast  . ropinirole  5 mg Oral TID  . tamsulosin  0.4 mg Oral Daily   Continuous Infusions: PRN Meds:.acetaminophen **OR** acetaminophen, bisacodyl, RESOURCE THICKENUP CLEAR Assessment/Plan: Active Problems:   PNA (pneumonia)   Pressure injury of skin  Craig Giles is an 83 yo man with a PMHx significant for Parkinson's, Hypothyroidism, and renal stones that were treated with lithotripsy 10 years ago.  He presented to the ED complaining of confusion generalized weakness, and urinary and bowel movement incontinence and was found to have a bladder stones causing an obstruction.  Nephrolithiasis and bladder stones causing obstruction: The patient passed a number of <35mm stones via the catheter  and  no longer has any suprapubic tenderness.  Urology will discharge him with the foley in place and follow up in a week.    Aspiration pneumonitis. The patient initially presented with fever and mild leukocytosis of 13.6 and small CXR findings of atelectasis in the absence of pulmonary symptoms.  However, due to his increased risk of aspiration pneumonia because of his Parkinson's disease and lack of mobility, there is still some concern for a subacute pneumonia.   -Continue Augmentin for 2 days   Acute encephalopathy.Resolved.  AKI. Resolved.  Parkinson's and history of paralysis agitans. -Continue his home dose of Sinemet and Requip.  Hypothyroidism. Continue his home dose of  Synthroid.  Dispo: Can be discharged today depending on SNF bed availability  This is a Psychologist, occupational Note.  The care of the patient was discussed with Dr. Rogelia Boga and the assessment and plan formulated with their assistance.  Please see their attached note for official documentation of the daily encounter.   LOS: 3 days   Ames Coupe, Medical Student 04/03/2017, 2:16 PM

## 2017-04-03 NOTE — Progress Notes (Signed)
  Date: 04/03/2017  Patient name: Danley DankerRaymond Manske  Medical record number: [id number]  Date of birth: 07/22/1931   I have seen and evaluated this patient and I have discussed the plan of care with the house staff. Please see their note for complete details. I concur with their findings with the following additions/corrections: Mr Viann FishCaligiure was seen on morning rounds. He complained of altered taste sensation which we explained that might be due to antibiotics. He is medically stable for transfer to SNF once a bed is available.  Burns SpainElizabeth A Butcher, MD 04/03/2017, 11:48 AM

## 2017-04-04 DIAGNOSIS — J690 Pneumonitis due to inhalation of food and vomit: Secondary | ICD-10-CM | POA: Diagnosis not present

## 2017-04-04 DIAGNOSIS — G20 Parkinson's disease: Secondary | ICD-10-CM | POA: Diagnosis not present

## 2017-04-04 DIAGNOSIS — N139 Obstructive and reflux uropathy, unspecified: Secondary | ICD-10-CM | POA: Diagnosis not present

## 2017-04-04 DIAGNOSIS — N136 Pyonephrosis: Secondary | ICD-10-CM | POA: Diagnosis not present

## 2017-04-05 LAB — CULTURE, BLOOD (ROUTINE X 2)
CULTURE: NO GROWTH
Culture: NO GROWTH
SPECIAL REQUESTS: ADEQUATE
SPECIAL REQUESTS: ADEQUATE

## 2017-04-09 DIAGNOSIS — G20 Parkinson's disease: Secondary | ICD-10-CM | POA: Diagnosis not present

## 2017-04-09 DIAGNOSIS — N200 Calculus of kidney: Secondary | ICD-10-CM | POA: Diagnosis not present

## 2017-04-09 DIAGNOSIS — E039 Hypothyroidism, unspecified: Secondary | ICD-10-CM | POA: Diagnosis not present

## 2017-04-09 DIAGNOSIS — N320 Bladder-neck obstruction: Secondary | ICD-10-CM | POA: Diagnosis not present

## 2017-04-10 DIAGNOSIS — R339 Retention of urine, unspecified: Secondary | ICD-10-CM | POA: Diagnosis not present

## 2017-04-11 DIAGNOSIS — J690 Pneumonitis due to inhalation of food and vomit: Secondary | ICD-10-CM | POA: Diagnosis not present

## 2017-04-11 DIAGNOSIS — N139 Obstructive and reflux uropathy, unspecified: Secondary | ICD-10-CM | POA: Diagnosis not present

## 2017-04-11 DIAGNOSIS — N200 Calculus of kidney: Secondary | ICD-10-CM | POA: Diagnosis not present

## 2017-04-11 DIAGNOSIS — G20 Parkinson's disease: Secondary | ICD-10-CM | POA: Diagnosis not present

## 2017-04-17 DIAGNOSIS — R339 Retention of urine, unspecified: Secondary | ICD-10-CM | POA: Diagnosis not present

## 2017-04-18 DIAGNOSIS — R339 Retention of urine, unspecified: Secondary | ICD-10-CM | POA: Diagnosis not present

## 2017-04-21 DIAGNOSIS — E039 Hypothyroidism, unspecified: Secondary | ICD-10-CM | POA: Diagnosis not present

## 2017-04-21 DIAGNOSIS — G20 Parkinson's disease: Secondary | ICD-10-CM | POA: Diagnosis not present

## 2017-04-21 DIAGNOSIS — N136 Pyonephrosis: Secondary | ICD-10-CM | POA: Diagnosis not present

## 2017-04-21 DIAGNOSIS — N139 Obstructive and reflux uropathy, unspecified: Secondary | ICD-10-CM | POA: Diagnosis not present

## 2017-04-24 DIAGNOSIS — N138 Other obstructive and reflux uropathy: Secondary | ICD-10-CM | POA: Diagnosis not present

## 2017-04-24 DIAGNOSIS — G20 Parkinson's disease: Secondary | ICD-10-CM | POA: Diagnosis not present

## 2017-04-24 DIAGNOSIS — N182 Chronic kidney disease, stage 2 (mild): Secondary | ICD-10-CM | POA: Diagnosis not present

## 2017-04-24 DIAGNOSIS — R4182 Altered mental status, unspecified: Secondary | ICD-10-CM | POA: Diagnosis not present

## 2017-04-24 DIAGNOSIS — G2581 Restless legs syndrome: Secondary | ICD-10-CM | POA: Diagnosis not present

## 2017-04-24 DIAGNOSIS — M6281 Muscle weakness (generalized): Secondary | ICD-10-CM | POA: Diagnosis not present

## 2017-04-24 DIAGNOSIS — Z466 Encounter for fitting and adjustment of urinary device: Secondary | ICD-10-CM | POA: Diagnosis not present

## 2017-04-24 DIAGNOSIS — N401 Enlarged prostate with lower urinary tract symptoms: Secondary | ICD-10-CM | POA: Diagnosis not present

## 2017-04-24 DIAGNOSIS — F5104 Psychophysiologic insomnia: Secondary | ICD-10-CM | POA: Diagnosis not present

## 2017-04-24 DIAGNOSIS — R339 Retention of urine, unspecified: Secondary | ICD-10-CM | POA: Diagnosis not present

## 2017-04-25 ENCOUNTER — Emergency Department (HOSPITAL_COMMUNITY)
Admission: EM | Admit: 2017-04-25 | Discharge: 2017-04-25 | Disposition: A | Discharge status: Home / Self Care | Payer: Medicare Other | Attending: Emergency Medicine | Admitting: Emergency Medicine

## 2017-04-25 DIAGNOSIS — N390 Urinary tract infection, site not specified: Secondary | ICD-10-CM | POA: Diagnosis not present

## 2017-04-25 DIAGNOSIS — Z79899 Other long term (current) drug therapy: Secondary | ICD-10-CM | POA: Insufficient documentation

## 2017-04-25 DIAGNOSIS — E039 Hypothyroidism, unspecified: Secondary | ICD-10-CM | POA: Diagnosis not present

## 2017-04-25 DIAGNOSIS — G20 Parkinson's disease: Secondary | ICD-10-CM | POA: Insufficient documentation

## 2017-04-25 DIAGNOSIS — R339 Retention of urine, unspecified: Secondary | ICD-10-CM | POA: Insufficient documentation

## 2017-04-25 DIAGNOSIS — T849XXA Unspecified complication of internal orthopedic prosthetic device, implant and graft, initial encounter: Secondary | ICD-10-CM | POA: Diagnosis not present

## 2017-04-25 DIAGNOSIS — T83498A Other mechanical complication of other prosthetic devices, implants and grafts of genital tract, initial encounter: Secondary | ICD-10-CM | POA: Diagnosis not present

## 2017-04-25 LAB — URINALYSIS, ROUTINE W REFLEX MICROSCOPIC
Bilirubin Urine: NEGATIVE
Glucose, UA: NEGATIVE mg/dL
Ketones, ur: NEGATIVE mg/dL
Nitrite: POSITIVE — AB
Protein, ur: 30 mg/dL — AB
SQUAMOUS EPITHELIAL / LPF: NONE SEEN
Specific Gravity, Urine: 1.016 (ref 1.005–1.030)
pH: 6 (ref 5.0–8.0)

## 2017-04-25 MED ORDER — CEFTRIAXONE SODIUM 1 G IJ SOLR
1.0000 g | Freq: Once | INTRAMUSCULAR | Status: AC
  Administered 2017-04-25: 1 g via INTRAMUSCULAR
  Filled 2017-04-25: qty 10

## 2017-04-25 MED ORDER — CEFUROXIME AXETIL 250 MG PO TABS: 250.0000 mg | ORAL_TABLET | Freq: Two times a day (BID) | ORAL | 0 refills | Status: DC

## 2017-04-25 MED ORDER — LIDOCAINE HCL 2 % EX GEL
1.0000 "application " | Freq: Once | CUTANEOUS | Status: AC | PRN
  Administered 2017-04-25: 1 via URETHRAL
  Filled 2017-04-25: qty 11

## 2017-04-25 MED ORDER — LIDOCAINE HCL 1 % IJ SOLN
INTRAMUSCULAR | Status: AC
  Administered 2017-04-25: 20 mL
  Filled 2017-04-25: qty 20

## 2017-04-25 NOTE — ED Provider Notes (Signed)
[facility]-EMERGENCY DEPT Provider Note   CSN: [id number] Arrival date & time: 04/25/17  0143  By signing my name below, I, Bing Neighbors., attest that this documentation has been prepared under the direction and in the presence of Kalise Fickett, Canary Brim, *. Electronically signed: Bing Neighbors., ED Scribe. 04/25/17. 3:38 AM.   History   Chief Complaint Chief Complaint  Patient presents with  . Urinary Retention    HPI Craig Giles is a 83 y.o. male who [facility] complaining of urinary retention with onset x12 hours. Pt states that he has difficulty urinating for the past x1 week after his foley catheter was removed. He states that he had the same issue x3 weeks ago.  He states that today when he went to urinate there was not a lot of urine. Pt denies any modifying factors. He denies any other complaints.    The history is provided by the patient. No language interpreter was used.    Past Medical History:  Diagnosis Date  . Hypothyroidism   . Memory deficits 10/22/2013  . Parkinson disease (HCC)   . 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 Pressure injury of skin 04/01/2017  . PNA (pneumonia) 03/31/2017  . Chronic insomnia 08/12/2016  . 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 Memory deficits 10/22/2013  . Abnormality of gait 11/10/2012  . Paralysis agitans (HCC) 11/10/2012    Past Surgical History:  Procedure Laterality Date  . TONSILLECTOMY         Home Medications    Prior to Admission medications   Medication Sig Start Date End Date Taking? Authorizing Provider  acetaminophen (TYLENOL) 325 MG tablet Take 650 mg by mouth every 6 (six) hours as needed for mild pain, moderate pain, fever or headache.    Yes [provider]  carbidopa-levodopa (SINEMET IR) 25-100 MG tablet Take 1 tablet by mouth 3 (three) times daily. 12/30/16  Yes York Spaniel, MD  levothyroxine (SYNTHROID, LEVOTHROID) 88 MCG tablet Take 88 mcg by mouth daily before breakfast.    Yes [provider]  Multiple Vitamin (MULTIVITAMIN WITH MINERALS) TABS tablet Take 1 tablet by mouth daily.   Yes [provider]  polyethylene glycol (MIRALAX / GLYCOLAX) packet Take 17 g by mouth daily.    Yes [provider]  ropinirole (REQUIP) 5 MG tablet Take 5 mg by mouth daily.   Yes [provider]  tamsulosin (FLOMAX) 0.4 MG CAPS capsule Take 1 capsule (0.4 mg total) by mouth daily. 04/04/17  Yes [name], MD  traZODone (DESYREL) 100 MG tablet Take 1 tablet (100 mg total) by mouth at bedtime. 12/30/16  Yes York Spaniel, MD  zinc sulfate 220 (50 Zn) MG capsule Take 220 mg by mouth daily.   Yes [provider]  cefUROXime (CEFTIN) 250 MG tablet Take 1 tablet (250 mg total) by mouth 2 (two) times daily with a meal. 04/25/17   Inioluwa Boulay, Canary Brim, MD    Family History Family History  Problem Relation Age of Onset  . Diabetes Brother     Social History Social History  Substance Use Topics  . Smoking status: Never Smoker  . Smokeless tobacco: Never Used  . Alcohol use No     Allergies   Patient has no known allergies.   Review of Systems Review of Systems  Constitutional: Negative for fever.  Gastrointestinal: Positive for abdominal  pain.  Genitourinary: Positive for decreased urine volume and difficulty urinating.     Physical Exam Updated Vital Signs BP 132/70   Pulse 67   Temp 98.1 F (36.7 C) (Oral)   Resp 19   SpO2 97%   Physical Exam  Constitutional: He is oriented to person, place, and time. He appears well-developed and well-nourished. No distress.  HENT:  Head: Normocephalic and atraumatic.  Right Ear: Hearing normal.  Left Ear: Hearing normal.  Nose: Nose normal.  Mouth/Throat: Oropharynx is clear and moist and mucous membranes are normal.  Eyes: Conjunctivae and EOM are normal.  Pupils are equal, round, and reactive to light.  Neck: Normal range of motion. Neck supple.  Cardiovascular: Regular rhythm, S1 normal and S2 normal.  Exam reveals no gallop and no friction rub.   No murmur heard. Pulmonary/Chest: Effort normal and breath sounds normal. No respiratory distress. He exhibits no tenderness.  Abdominal: Soft. Normal appearance and bowel sounds are normal. There is no hepatosplenomegaly. There is tenderness in the suprapubic area. There is no rebound, no guarding, no tenderness at McBurney's point and negative Murphy's sign. No hernia.  Musculoskeletal: Normal range of motion.  Neurological: He is alert and oriented to person, place, and time. He has normal strength. No cranial nerve deficit or sensory deficit. Coordination normal. GCS eye subscore is 4. GCS verbal subscore is 5. GCS motor subscore is 6.  Skin: Skin is warm, dry and intact. No rash noted. No cyanosis.  Psychiatric: He has a normal mood and affect. His speech is normal and behavior is normal. Thought content normal.  Nursing note and vitals reviewed.    ED Treatments / Results   DIAGNOSTIC STUDIES: Oxygen Saturation is 96% on RA, adequate by my interpretation.   COORDINATION OF CARE: 3:38 AM-Discussed next steps with pt. Pt verbalized understanding and is agreeable with the plan.    Labs (all labs ordered are listed, but only abnormal results are displayed) Labs Reviewed  URINALYSIS, ROUTINE W REFLEX MICROSCOPIC - Abnormal; Notable for the following:       Result Value   APPearance HAZY (*)    Hgb urine dipstick SMALL (*)    Protein, ur 30 (*)    Nitrite POSITIVE (*)    Leukocytes, UA LARGE (*)    Bacteria, UA MANY (*)    All other components within normal limits    EKG  EKG Interpretation None       Radiology No results found.  Procedures Procedures (including critical care time)  Medications Ordered in ED Medications  cefTRIAXone (ROCEPHIN) injection 1 g (not  administered)  lidocaine (XYLOCAINE) 2 % jelly 1 application (1 application Urethral Given 04/25/17 0211)     Initial Impression / Assessment and Plan / ED Course  I have reviewed the triage vital signs and the nursing notes.  Pertinent labs & imaging results that were available during my care of the patient were reviewed by me and considered in my medical decision making (see chart for details).     Patient presents to the emergency department for evaluation of urinary retention. Patient indicates that he did have a similar episode recently and required a Foley catheter. He reports that the catheter has been out for approximately 1 week, has noticed progressively decreased urine output. Bladder scan showed moderate amount of retained urine in the bladder, Foley catheter was placed. Urinalysis grossly consistent with infection. Culture pending. Patient treated with Rocephin IM. Will continue out patient Ceftin. He does not have  any signs of sepsis requiring hospitalization.  Final Clinical Impressions(s) / ED Diagnoses   Final diagnoses:  Urinary retention  Urinary tract infection without hematuria, site unspecified    New Prescriptions New Prescriptions   CEFUROXIME (CEFTIN) 250 MG TABLET    Take 1 tablet (250 mg total) by mouth 2 (two) times daily with a meal.   I personally performed the services described in this documentation, which was scribed in my presence. The recorded information has been reviewed and is accurate.     Gilda Crease, MD 04/25/17 [phone]

## 2017-04-25 NOTE — ED Notes (Signed)
Foley cath draining 450 ml amber cloudy urine specimen sent to lab.

## 2017-04-25 NOTE — ED Notes (Signed)
[facility] called for transport back to [facility]. °

## 2017-04-25 NOTE — ED Triage Notes (Signed)
[facility]. Pt has not been able to urinate for 12 hours per staff. Staff attempted to place a foley and they were unsuccessful.

## 2017-04-29 DIAGNOSIS — R2689 Other abnormalities of gait and mobility: Secondary | ICD-10-CM | POA: Diagnosis not present

## 2017-04-29 DIAGNOSIS — N200 Calculus of kidney: Secondary | ICD-10-CM | POA: Diagnosis not present

## 2017-04-29 DIAGNOSIS — G20 Parkinson's disease: Secondary | ICD-10-CM | POA: Diagnosis not present

## 2017-04-29 DIAGNOSIS — M6281 Muscle weakness (generalized): Secondary | ICD-10-CM | POA: Diagnosis not present

## 2017-04-29 DIAGNOSIS — N138 Other obstructive and reflux uropathy: Secondary | ICD-10-CM | POA: Diagnosis not present

## 2017-04-29 DIAGNOSIS — R269 Unspecified abnormalities of gait and mobility: Secondary | ICD-10-CM | POA: Diagnosis not present

## 2017-05-08 DIAGNOSIS — R339 Retention of urine, unspecified: Secondary | ICD-10-CM | POA: Diagnosis not present

## 2017-05-18 ENCOUNTER — Encounter (HOSPITAL_COMMUNITY): Payer: Self-pay | Admitting: Emergency Medicine

## 2017-05-18 ENCOUNTER — Observation Stay (HOSPITAL_COMMUNITY)
Admission: EM | Admit: 2017-05-18 | Discharge: 2017-05-20 | Disposition: A | Discharge status: Skilled Nursing Facility | Payer: Medicare Other | Attending: Nephrology | Admitting: Nephrology

## 2017-05-18 DIAGNOSIS — K922 Gastrointestinal hemorrhage, unspecified: Secondary | ICD-10-CM | POA: Diagnosis present

## 2017-05-18 DIAGNOSIS — B965 Pseudomonas (aeruginosa) (mallei) (pseudomallei) as the cause of diseases classified elsewhere: Secondary | ICD-10-CM | POA: Insufficient documentation

## 2017-05-18 DIAGNOSIS — K625 Hemorrhage of anus and rectum: Secondary | ICD-10-CM | POA: Diagnosis not present

## 2017-05-18 DIAGNOSIS — Z978 Presence of other specified devices: Secondary | ICD-10-CM

## 2017-05-18 DIAGNOSIS — G20 Parkinson's disease: Secondary | ICD-10-CM | POA: Diagnosis not present

## 2017-05-18 DIAGNOSIS — R001 Bradycardia, unspecified: Secondary | ICD-10-CM | POA: Diagnosis not present

## 2017-05-18 DIAGNOSIS — Z79899 Other long term (current) drug therapy: Secondary | ICD-10-CM | POA: Insufficient documentation

## 2017-05-18 DIAGNOSIS — D500 Iron deficiency anemia secondary to blood loss (chronic): Secondary | ICD-10-CM | POA: Insufficient documentation

## 2017-05-18 DIAGNOSIS — R197 Diarrhea, unspecified: Secondary | ICD-10-CM | POA: Diagnosis not present

## 2017-05-18 DIAGNOSIS — Z960 Presence of urogenital implants: Secondary | ICD-10-CM

## 2017-05-18 DIAGNOSIS — T1490XA Injury, unspecified, initial encounter: Secondary | ICD-10-CM | POA: Diagnosis not present

## 2017-05-18 DIAGNOSIS — E039 Hypothyroidism, unspecified: Secondary | ICD-10-CM | POA: Diagnosis not present

## 2017-05-18 LAB — CBC
HCT: 30.5 % — ABNORMAL LOW (ref 39.0–52.0)
Hemoglobin: 9.9 g/dL — ABNORMAL LOW (ref 13.0–17.0)
MCH: 28.6 pg (ref 26.0–34.0)
MCHC: 32.5 g/dL (ref 30.0–36.0)
MCV: 88.2 fL (ref 78.0–100.0)
PLATELETS: 169 10*3/uL (ref 150–400)
RBC: 3.46 MIL/uL — ABNORMAL LOW (ref 4.22–5.81)
RDW: 15.2 % (ref 11.5–15.5)
WBC: 7.3 10*3/uL (ref 4.0–10.5)

## 2017-05-18 LAB — CBC WITH DIFFERENTIAL/PLATELET
Basophils Absolute: 0 10*3/uL (ref 0.0–0.1)
Basophils Relative: 0 %
Eosinophils Absolute: 0 10*3/uL (ref 0.0–0.7)
Eosinophils Relative: 0 %
HCT: 37.7 % — ABNORMAL LOW (ref 39.0–52.0)
HEMOGLOBIN: 12.5 g/dL — AB (ref 13.0–17.0)
LYMPHS ABS: 0.7 10*3/uL (ref 0.7–4.0)
LYMPHS PCT: 6 %
MCH: 28.9 pg (ref 26.0–34.0)
MCHC: 33.2 g/dL (ref 30.0–36.0)
MCV: 87.3 fL (ref 78.0–100.0)
MONOS PCT: 5 %
Monocytes Absolute: 0.6 10*3/uL (ref 0.1–1.0)
NEUTROS PCT: 89 %
Neutro Abs: 11.1 10*3/uL — ABNORMAL HIGH (ref 1.7–7.7)
Platelets: 215 10*3/uL (ref 150–400)
RBC: 4.32 MIL/uL (ref 4.22–5.81)
RDW: 14.8 % (ref 11.5–15.5)
WBC: 12.5 10*3/uL — AB (ref 4.0–10.5)

## 2017-05-18 LAB — BASIC METABOLIC PANEL
Anion gap: 8 (ref 5–15)
BUN: 18 mg/dL (ref 6–20)
CHLORIDE: 106 mmol/L (ref 101–111)
CO2: 25 mmol/L (ref 22–32)
Calcium: 9.2 mg/dL (ref 8.9–10.3)
Creatinine, Ser: 1.15 mg/dL (ref 0.61–1.24)
GFR calc Af Amer: >60 mL/min (ref >60)
GFR calc non Af Amer: 57 mL/min — ABNORMAL LOW (ref >60)
GLUCOSE: 110 mg/dL — AB (ref 65–99)
POTASSIUM: 4.3 mmol/L (ref 3.5–5.1)
Sodium: 139 mmol/L (ref 135–145)

## 2017-05-18 LAB — URINALYSIS, MICROSCOPIC (REFLEX)

## 2017-05-18 LAB — URINALYSIS, ROUTINE W REFLEX MICROSCOPIC
Bilirubin Urine: NEGATIVE
GLUCOSE, UA: NEGATIVE mg/dL
Ketones, ur: NEGATIVE mg/dL
Nitrite: NEGATIVE
PH: 7 (ref 5.0–8.0)
Protein, ur: 30 mg/dL — AB
SPECIFIC GRAVITY, URINE: 1.02 (ref 1.005–1.030)

## 2017-05-18 LAB — TYPE AND SCREEN
ABO/RH(D): O POS
Antibody Screen: NEGATIVE

## 2017-05-18 LAB — HEMOGLOBIN AND HEMATOCRIT, BLOOD
HCT: 34.1 % — ABNORMAL LOW (ref 39.0–52.0)
HEMOGLOBIN: 11.2 g/dL — AB (ref 13.0–17.0)

## 2017-05-18 LAB — GLUCOSE, CAPILLARY
GLUCOSE-CAPILLARY: 98 mg/dL (ref 65–99)
Glucose-Capillary: 108 mg/dL — ABNORMAL HIGH (ref 65–99)

## 2017-05-18 LAB — PROTIME-INR
INR: 1.14
Prothrombin Time: 14.7 seconds (ref 11.4–15.2)

## 2017-05-18 LAB — MRSA PCR SCREENING: MRSA by PCR: NEGATIVE

## 2017-05-18 LAB — APTT: APTT: 36 s (ref 24–36)

## 2017-05-18 MED ORDER — DEXTROSE-NACL 5-0.9 % IV SOLN
INTRAVENOUS | Status: DC
  Administered 2017-05-18 – 2017-05-19 (×3): via INTRAVENOUS

## 2017-05-18 MED ORDER — CARBIDOPA-LEVODOPA 25-100 MG PO TABS
1.0000 | ORAL_TABLET | Freq: Three times a day (TID) | ORAL | Status: DC
  Administered 2017-05-18 – 2017-05-20 (×7): 1 via ORAL
  Filled 2017-05-18 (×7): qty 1

## 2017-05-18 MED ORDER — SODIUM CHLORIDE 0.9% FLUSH
3.0000 mL | Freq: Two times a day (BID) | INTRAVENOUS | Status: DC
  Administered 2017-05-18 – 2017-05-19 (×2): 3 mL via INTRAVENOUS

## 2017-05-18 MED ORDER — ROPINIROLE HCL 1 MG PO TABS
5.0000 mg | ORAL_TABLET | Freq: Every day | ORAL | Status: DC
  Administered 2017-05-18 – 2017-05-19 (×2): 5 mg via ORAL
  Filled 2017-05-18 (×2): qty 5

## 2017-05-18 MED ORDER — ONDANSETRON HCL 4 MG PO TABS: 4.0000 mg | ORAL_TABLET | Freq: Four times a day (QID) | ORAL | Status: DC | PRN

## 2017-05-18 MED ORDER — CIPROFLOXACIN IN D5W 200 MG/100ML IV SOLN
200.0000 mg | Freq: Two times a day (BID) | INTRAVENOUS | Status: DC
  Administered 2017-05-18 (×2): 200 mg via INTRAVENOUS
  Filled 2017-05-18 (×3): qty 100

## 2017-05-18 MED ORDER — TRAZODONE HCL 50 MG PO TABS: 25.0000 mg | ORAL_TABLET | Freq: Every evening | ORAL | Status: DC | PRN

## 2017-05-18 MED ORDER — ZINC SULFATE 220 (50 ZN) MG PO CAPS
220.0000 mg | ORAL_CAPSULE | Freq: Every day | ORAL | Status: DC
  Administered 2017-05-18 – 2017-05-20 (×3): 220 mg via ORAL
  Filled 2017-05-18 (×3): qty 1

## 2017-05-18 MED ORDER — TAMSULOSIN HCL 0.4 MG PO CAPS
0.4000 mg | ORAL_CAPSULE | Freq: Every day | ORAL | Status: DC
  Administered 2017-05-18 – 2017-05-20 (×3): 0.4 mg via ORAL
  Filled 2017-05-18 (×3): qty 1

## 2017-05-18 MED ORDER — SODIUM CHLORIDE 0.9 % IV SOLN: 250.0000 mL | INTRAVENOUS | Status: DC | PRN

## 2017-05-18 MED ORDER — LEVOTHYROXINE SODIUM 88 MCG PO TABS
88.0000 ug | ORAL_TABLET | Freq: Every day | ORAL | Status: DC
  Administered 2017-05-19 – 2017-05-20 (×2): 88 ug via ORAL
  Filled 2017-05-18 (×2): qty 1

## 2017-05-18 MED ORDER — ADULT MULTIVITAMIN W/MINERALS CH
1.0000 | ORAL_TABLET | Freq: Every day | ORAL | Status: DC
  Administered 2017-05-18 – 2017-05-20 (×3): 1 via ORAL
  Filled 2017-05-18 (×3): qty 1

## 2017-05-18 MED ORDER — ONDANSETRON HCL 4 MG/2ML IJ SOLN: 4.0000 mg | Freq: Four times a day (QID) | INTRAMUSCULAR | Status: DC | PRN

## 2017-05-18 MED ORDER — SODIUM CHLORIDE 0.9% FLUSH: 3.0000 mL | INTRAVENOUS | Status: DC | PRN

## 2017-05-18 MED ORDER — TRAZODONE HCL 50 MG PO TABS
100.0000 mg | ORAL_TABLET | Freq: Every day | ORAL | Status: DC
  Administered 2017-05-18 – 2017-05-19 (×2): 100 mg via ORAL
  Filled 2017-05-18 (×2): qty 2

## 2017-05-18 NOTE — ED Notes (Signed)
Bed: WA09 Expected date:  Expected time:  Means of arrival:  Comments: 83yo M GI Bleed

## 2017-05-18 NOTE — H&P (Signed)
History and Physical    Craig Giles ZOX:[id number] DOB: 04/18/1931 DOA: 05/18/2017  Referring MD/NP/PA: EDP PCP: Willow OraAndy, Camille L, MD   Outpatient Specialists:  [facility]  Chief Complaint: Rectal bleeding  HPI: Craig Giles is a 83 y.o. male with medical history significant for but not limited to Parkinson's disease presenting with one-day history of an episode of bright red blood per rectum noted by nursing staff at the facility without nausea vomiting abdominal pain fever or chills, no dysuria.  ED Course: At the ED patient was hemodynamically stable with H&H of 12.5 and 37.7. Rectal exam indicated bloody mucus discharge around the rectum with light brown stool noted.  He was being planned to be discharged back to the facility but hospitalist was consulted for admission after a witnessed episode of hematochezia at the ED  Review of Systems: As per HPI otherwise 10 point review of systems negative.   Past Medical History:  Diagnosis Date  . Hypothyroidism   . Memory deficits 10/22/2013  . Parkinson disease (HCC)   . 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 TONSILLECTOMY       reports that he has never smoked. He has never used smokeless tobacco. He reports that he does not drink alcohol or use drugs.  Not on File  Family History  Problem Relation Age of Onset  . Diabetes Brother     Prior to Admission medications   Medication Sig Start Date End Date Taking? Authorizing Provider  acetaminophen (TYLENOL) 325 MG tablet Take 650 mg by mouth every 6 (six) hours as needed for mild pain, moderate pain, fever or headache.    Yes [provider]  carbidopa-levodopa (SINEMET IR) 25-100 MG tablet Take 1 tablet by mouth 3 (three) times daily. 12/30/16  Yes York SpanielWillis, Charles K, MD  levothyroxine (SYNTHROID, LEVOTHROID) 88 MCG tablet Take 88 mcg by mouth daily before breakfast.    Yes  [provider]  Multiple Vitamin (MULTIVITAMIN WITH MINERALS) TABS tablet Take 1 tablet by mouth daily.   Yes [provider]  polyethylene glycol (MIRALAX / GLYCOLAX) packet Take 17 g by mouth daily.    Yes [provider]  ropinirole (REQUIP) 5 MG tablet Take 5 mg by mouth daily.   Yes [provider]  saccharomyces boulardii (FLORASTOR) 250 MG capsule Take 250 mg by mouth daily.   Yes [provider]  tamsulosin (FLOMAX) 0.4 MG CAPS capsule Take 1 capsule (0.4 mg total) by mouth daily. 04/04/17  Yes Arnetha CourserAmin, Sumayya, MD  traZODone (DESYREL) 100 MG tablet Take 1 tablet (100 mg total) by mouth at bedtime. 12/30/16  Yes York SpanielWillis, Charles K, MD  vitamin C (ASCORBIC ACID) 250 MG tablet Take 250 mg by mouth daily.   Yes [provider]  zinc sulfate 220 (50 Zn) MG capsule Take 220 mg by mouth daily.   Yes [provider]    Physical Exam: Vitals:   05/18/17 0627 05/18/17 0630 05/18/17 0700 05/18/17 0730  BP: 125/86 112/74 98/66 96/67   Pulse: 72 71 66 64  Resp: 18     Temp:      TempSrc:      SpO2: 99% 98% 98% 98%  Weight:      Height:          Constitutional: NAD, calm, comfortable Vitals:   05/18/17 0627 05/18/17 0630 05/18/17 0700 05/18/17 0730  BP: 125/86 112/74 98/66 96/67   Pulse: 72 71  66 64  Resp: 18     Temp:      TempSrc:      SpO2: 99% 98% 98% 98%  Weight:      Height:       Eyes: PERRL, lids and conjunctivae normal ENMT: Mucous membranes are moist. Posterior pharynx clear of any exudate or lesions.Normal dentition.  Neck: normal, supple, no masses, no thyromegaly Respiratory: clear to auscultation bilaterally, no wheezing, no crackles. Normal respiratory effort. No accessory muscle use.  Cardiovascular: Regular rate and rhythm, no murmurs / rubs / gallops.   Abdomen: no tenderness, no masses palpated. No hepatosplenomegaly. Bowel sounds positive.  Extremities: no cyanosis. No edema.  Skin: no  rashes Neurologic: Alert and oriented, appropriate Psychiatric: Normal affect.Normal mood.     Labs on Admission: I have personally reviewed following labs and imaging studies  CBC:  Recent Labs Lab 05/18/17 0247  WBC 12.5*  NEUTROABS 11.1*  HGB 12.5*  HCT 37.7*  MCV 87.3  PLT 215   Basic Metabolic Panel:  Recent Labs Lab 05/18/17 0247  NA 139  K 4.3  CL 106  CO2 25  GLUCOSE 110*  BUN 18  CREATININE 1.15  CALCIUM 9.2   GFR: Estimated Creatinine Clearance: 41.4 mL/min (by C-G formula based on SCr of 1.15 mg/dL). Liver Function Tests: No results for input(s): AST, ALT, ALKPHOS, BILITOT, PROT, ALBUMIN in the last 168 hours. No results for input(s): LIPASE, AMYLASE in the last 168 hours. No results for input(s): AMMONIA in the last 168 hours. Coagulation Profile: No results for input(s): INR, PROTIME in the last 168 hours. Cardiac Enzymes: No results for input(s): CKTOTAL, CKMB, CKMBINDEX, TROPONINI in the last 168 hours. BNP (last 3 results) No results for input(s): PROBNP in the last 8760 hours. HbA1C: No results for input(s): HGBA1C in the last 72 hours. CBG: No results for input(s): GLUCAP in the last 168 hours. Lipid Profile: No results for input(s): CHOL, HDL, LDLCALC, TRIG, CHOLHDL, LDLDIRECT in the last 72 hours. Thyroid Function Tests: No results for input(s): TSH, T4TOTAL, FREET4, T3FREE, THYROIDAB in the last 72 hours. Anemia Panel: No results for input(s): VITAMINB12, FOLATE, FERRITIN, TIBC, IRON, RETICCTPCT in the last 72 hours. Urine analysis:    Component Value Date/Time   COLORURINE YELLOW 05/18/2017 0557   APPEARANCEUR CLEAR 05/18/2017 0557   LABSPEC 1.020 05/18/2017 0557   PHURINE 7.0 05/18/2017 0557   GLUCOSEU NEGATIVE 05/18/2017 0557   HGBUR MODERATE (A) 05/18/2017 0557   BILIRUBINUR NEGATIVE 05/18/2017 0557   KETONESUR NEGATIVE 05/18/2017 0557   PROTEINUR 30 (A) 05/18/2017 0557   NITRITE NEGATIVE 05/18/2017 0557   LEUKOCYTESUR  MODERATE (A) 05/18/2017 0557   Sepsis Labs: @LABRCNTIP (procalcitonin:4,lacticidven:4) )No results found for this or any previous visit (from the past 240 hour(s)).   Radiological Exams on Admission: No results found.  EKG:   Assessment/Plan Active Problems:   GI bleed   #1 lower GI bleed: Nothing by mouth Hemodynamic monitoring Serial H&H Type and hold PRBC PRBC transfusion as needed  GI consult for endoscopic evaluation   #2 Anemia: Mild Not new Hemoglobin was 10.7-10.9 in May 2018 Serial H&H Transfuse as needed Follow clinically  #3 UTI: Urine culture Antibiotic coverage    DVT prophylaxis: (SCD') Code Status: (Full) Family Communication:  Disposition Plan:  [facility]) Consults called: GI (Dr Ewing Schlein of [facility]) Admission status: (inpatient / tele)   OSEI-BONSU,Kimyetta Flott MD [facility] Pager [phone]   If 7PM-7AM, please contact night-coverage [email or web address] Password [facility]  05/18/2017, 7:42 AM

## 2017-05-18 NOTE — ED Triage Notes (Signed)
[facility] with c/o rectal bleeding.   Per facility, pt has had "bright blood" in his stool tonight.  Pt denies abdominal pain or discomfort but it was reported that pt has had "some diarrhea" yesterday.

## 2017-05-18 NOTE — Progress Notes (Signed)
Case discussed with admitting physician  And hospital computer chart reviewed - will allow clear liquids and follow stools and H&H and consider nuclear bleeding scan stat if bleeding increases otherwise we will plan to see patient in the morning and help decide further workup and plansbut as an aside a CAT scan on April 30 did not reveal any significant bowel pathology except for constipation and call us sooner if question or problem

## 2017-05-18 NOTE — ED Notes (Addendum)
Pt was assisted to use bedpan---- rectal output watery and mucusy and bloody, bright red in color noted.  EDP was made aware of pt's stool; pt was advised by EDP to get admitted for further observation.

## 2017-05-18 NOTE — ED Provider Notes (Addendum)
[facility]-EMERGENCY DEPT Provider Note   CSN: [id number] Arrival date & time: 05/18/17  0154     History   Chief Complaint Chief Complaint  Patient presents with  . Rectal Bleeding    HPI Craig Giles is a 83 y.o. male.  Patient presents from nursing home with complaint of rectal bleeding. Per EMS report, the nursing staff at the nursing home saw BRB in his stool tonight. The patient is not having any abdominal pain, rectal pain, nausea, vomiting or fever. He has a chronic indwelling foley catheter and has no urinary complaints. He denies history of rectal bleeding.    The history is provided by the patient. No language interpreter was used.  Rectal Bleeding  Associated symptoms: no abdominal pain, no fever and no vomiting     Past Medical History:  Diagnosis Date  . Hypothyroidism   . Memory deficits 10/22/2013  . Parkinson disease (HCC)   . 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 Pressure injury of skin 04/01/2017  . PNA (pneumonia) 03/31/2017  . Chronic insomnia 08/12/2016  . 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 Memory deficits 10/22/2013  . Abnormality of gait 11/10/2012  . Paralysis agitans (HCC) 11/10/2012    Past Surgical History:  Procedure Laterality Date  . TONSILLECTOMY         Home Medications    Prior to Admission medications   Medication Sig Start Date End Date Taking? Authorizing Provider  acetaminophen (TYLENOL) 325 MG tablet Take 650 mg by mouth every 6 (six) hours as needed for mild pain, moderate pain, fever or headache.    Yes [provider]  carbidopa-levodopa (SINEMET IR) 25-100 MG tablet Take 1 tablet by mouth 3 (three) times daily. 12/30/16  Yes York Spaniel, MD  levothyroxine (SYNTHROID, LEVOTHROID) 88 MCG tablet Take 88 mcg by mouth daily before breakfast.    Yes [provider]  Multiple Vitamin (MULTIVITAMIN WITH MINERALS) TABS tablet Take 1  tablet by mouth daily.   Yes [provider]  polyethylene glycol (MIRALAX / GLYCOLAX) packet Take 17 g by mouth daily.    Yes [provider]  ropinirole (REQUIP) 5 MG tablet Take 5 mg by mouth daily.   Yes [provider]  saccharomyces boulardii (FLORASTOR) 250 MG capsule Take 250 mg by mouth daily.   Yes [provider]  tamsulosin (FLOMAX) 0.4 MG CAPS capsule Take 1 capsule (0.4 mg total) by mouth daily. 04/04/17  Yes [name], MD  traZODone (DESYREL) 100 MG tablet Take 1 tablet (100 mg total) by mouth at bedtime. 12/30/16  Yes York Spaniel, MD  vitamin C (ASCORBIC ACID) 250 MG tablet Take 250 mg by mouth daily.   Yes [provider]  zinc sulfate 220 (50 Zn) MG capsule Take 220 mg by mouth daily.   Yes [provider]    Family History Family History  Problem Relation Age of Onset  . Diabetes Brother     Social History Social History  Substance Use Topics  . Smoking status: Never Smoker  . Smokeless tobacco: Never Used  . Alcohol use No     Allergies   Patient has no known allergies.   Review of Systems Review of Systems  Constitutional: Negative for chills and fever.  Gastrointestinal: Positive for blood in stool, diarrhea and hematochezia. Negative for abdominal pain, constipation, nausea and vomiting.  Genitourinary: Negative.   Musculoskeletal: Negative.   Skin:  Negative.   Neurological: Negative.      Physical Exam Updated Vital Signs BP 118/76 (BP Location: Right Arm)   Pulse 76   Temp 99.8 F (37.7 C) (Oral)   Resp 16   Ht 5\' 5"  (1.651 m)   Wt 61.2 kg (135 lb)   SpO2 96%   BMI 22.47 kg/m   Physical Exam  Constitutional: He is oriented to person, place, and time. He appears well-developed and well-nourished.  Neck: Normal range of motion.  Pulmonary/Chest: Effort normal.  Abdominal: Soft. He exhibits no mass. There is no tenderness. There is no guarding.  Genitourinary:  Genitourinary  Comments: There is a bloody mucus discharge around rectum. No fissure or hemorrhoid visualized. Rectum is nontender. Stool is light brown in color on digital rectal exam.   Musculoskeletal: Normal range of motion.  Neurological: He is alert and oriented to person, place, and time.  Skin: Skin is warm and dry.  Psychiatric: He has a normal mood and affect.     ED Treatments / Results  Labs (all labs ordered are listed, but only abnormal results are displayed) Labs Reviewed  CBC WITH DIFFERENTIAL/PLATELET  BASIC METABOLIC PANEL   Results for orders placed or performed during the hospital encounter of 05/18/17  CBC with Differential  Result Value Ref Range   WBC 12.5 (H) 4.0 - 10.5 K/uL   RBC 4.32 4.22 - 5.81 MIL/uL   Hemoglobin 12.5 (L) 13.0 - 17.0 g/dL   HCT 16.1 (L) 09.6 - 04.5 %   MCV 87.3 78.0 - 100.0 fL   MCH 28.9 26.0 - 34.0 pg   MCHC 33.2 30.0 - 36.0 g/dL   RDW 40.9 81.1 - 91.4 %   Platelets 215 150 - 400 K/uL   Neutrophils Relative % 89 %   Neutro Abs 11.1 (H) 1.7 - 7.7 K/uL   Lymphocytes Relative 6 %   Lymphs Abs 0.7 0.7 - 4.0 K/uL   Monocytes Relative 5 %   Monocytes Absolute 0.6 0.1 - 1.0 K/uL   Eosinophils Relative 0 %   Eosinophils Absolute 0.0 0.0 - 0.7 K/uL   Basophils Relative 0 %   Basophils Absolute 0.0 0.0 - 0.1 K/uL  Basic metabolic panel  Result Value Ref Range   Sodium 139 135 - 145 mmol/L   Potassium 4.3 3.5 - 5.1 mmol/L   Chloride 106 101 - 111 mmol/L   CO2 25 22 - 32 mmol/L   Glucose, Bld 110 (H) 65 - 99 mg/dL   BUN 18 6 - 20 mg/dL   Creatinine, Ser 7.82 0.61 - 1.24 mg/dL   Calcium 9.2 8.9 - 95.6 mg/dL   GFR calc non Af Amer 57 (L) >60 mL/min   GFR calc Af Amer >60 >60 mL/min   Anion gap 8 5 - 15    EKG  EKG Interpretation None       Radiology No results found.  Procedures Procedures (including critical care time)  Medications Ordered in ED Medications - No data to display   Initial Impression / Assessment and Plan / ED  Course  I have reviewed the triage vital signs and the nursing notes.  Pertinent labs & imaging results that were available during my care of the patient were reviewed by me and considered in my medical decision making (see chart for details).     Patient in the emergency room for evaluation of blood per rectum noticed last night by nursing home staff. Patient has no complaints.   The  patient has bloody mucus type discharge around rectum but stool is normal in color. Nontender, benign abdomen. VSS. Labs are essentially normal, specifically a hemoglobin of 12.5.  Foley tubing contains cloudy urine with sediment. Foley changed. Urine positive for infection, however, patient has no urinary or bladder symptoms, no fever, nausea. His catheter is indwelling but without symptoms of UTI will culture and recommend follow up with PCP to obtain results of culture.   He can be discharged back to nursing facility. Will refer to GI for further evaluation but feel patient is stable for outpatient follow up.   ADDENDUM: 5:45 - patient had a bowel movement in the ED which was significantly bloody. Disposition changed to admit so that further evaluation can be obtained.   Final Clinical Impressions(s) / ED Diagnoses   Final diagnoses:  None   1. Rectal bleeding 2. Indwelling foley catheter  New Prescriptions New Prescriptions   No medications on file     Elpidio AnisUpstill, Angeliah Wisdom, Cordelia Poche-C 05/18/17 0445    Melene PlanFloyd, Dan, DO 05/18/17 0448    Elpidio AnisUpstill, Louann Hopson, PA-C 05/18/17 0551    Melene PlanFloyd, Dan, DO 05/18/17 [phone]

## 2017-05-19 ENCOUNTER — Other Ambulatory Visit (HOSPITAL_COMMUNITY): Payer: Medicare Other

## 2017-05-19 DIAGNOSIS — K625 Hemorrhage of anus and rectum: Secondary | ICD-10-CM | POA: Diagnosis not present

## 2017-05-19 DIAGNOSIS — K922 Gastrointestinal hemorrhage, unspecified: Secondary | ICD-10-CM | POA: Diagnosis not present

## 2017-05-19 DIAGNOSIS — N390 Urinary tract infection, site not specified: Secondary | ICD-10-CM

## 2017-05-19 LAB — BASIC METABOLIC PANEL
Anion gap: 5 (ref 5–15)
BUN: 11 mg/dL (ref 6–20)
CALCIUM: 8.8 mg/dL — AB (ref 8.9–10.3)
CO2: 27 mmol/L (ref 22–32)
CREATININE: 0.95 mg/dL (ref 0.61–1.24)
Chloride: 107 mmol/L (ref 101–111)
GFR calc non Af Amer: >60 mL/min (ref >60)
Glucose, Bld: 105 mg/dL — ABNORMAL HIGH (ref 65–99)
Potassium: 3.8 mmol/L (ref 3.5–5.1)
SODIUM: 139 mmol/L (ref 135–145)

## 2017-05-19 LAB — CBC
HCT: 32.2 % — ABNORMAL LOW (ref 39.0–52.0)
HEMATOCRIT: 30 % — AB (ref 39.0–52.0)
Hemoglobin: 9.7 g/dL — ABNORMAL LOW (ref 13.0–17.0)
Hemoglobin: 10.3 g/dL — ABNORMAL LOW (ref 13.0–17.0)
MCH: 28.8 pg (ref 26.0–34.0)
MCH: 28.5 pg (ref 26.0–34.0)
MCHC: 32.3 g/dL (ref 30.0–36.0)
MCHC: 32 g/dL (ref 30.0–36.0)
MCV: 89 fL (ref 78.0–100.0)
MCV: 89 fL (ref 78.0–100.0)
PLATELETS: 148 10*3/uL — AB (ref 150–400)
PLATELETS: 154 10*3/uL (ref 150–400)
RBC: 3.37 MIL/uL — AB (ref 4.22–5.81)
RBC: 3.62 MIL/uL — AB (ref 4.22–5.81)
RDW: 15.2 % (ref 11.5–15.5)
RDW: 15.2 % (ref 11.5–15.5)
WBC: 7.1 10*3/uL (ref 4.0–10.5)
WBC: 7.2 10*3/uL (ref 4.0–10.5)

## 2017-05-19 LAB — HEMOGLOBIN AND HEMATOCRIT, BLOOD
HCT: 30.9 % — ABNORMAL LOW (ref 39.0–52.0)
Hemoglobin: 10 g/dL — ABNORMAL LOW (ref 13.0–17.0)

## 2017-05-19 LAB — IRON AND TIBC
IRON: 23 ug/dL — AB (ref 45–182)
SATURATION RATIOS: 14 % — AB (ref 17.9–39.5)
TIBC: 169 ug/dL — ABNORMAL LOW (ref 250–450)
UIBC: 146 ug/dL

## 2017-05-19 LAB — ABO/RH: ABO/RH(D): O POS

## 2017-05-19 LAB — PROTIME-INR
INR: 1.17
PROTHROMBIN TIME: 15 s (ref 11.4–15.2)

## 2017-05-19 LAB — GLUCOSE, CAPILLARY
GLUCOSE-CAPILLARY: 115 mg/dL — AB (ref 65–99)
GLUCOSE-CAPILLARY: 102 mg/dL — AB (ref 65–99)

## 2017-05-19 LAB — APTT: aPTT: 41 seconds — ABNORMAL HIGH (ref 24–36)

## 2017-05-19 LAB — FERRITIN: Ferritin: 239 ng/mL (ref 24–336)

## 2017-05-19 LAB — LACTIC ACID, PLASMA: Lactic Acid, Venous: 1 mmol/L (ref 0.5–1.9)

## 2017-05-19 MED ORDER — FUROSEMIDE 10 MG/ML IJ SOLN
40.0000 mg | Freq: Once | INTRAMUSCULAR | Status: DC
Start: 2017-05-19 — End: 2017-05-19

## 2017-05-19 MED ORDER — SODIUM CHLORIDE 0.9 % IV BOLUS (SEPSIS)
500.0000 mL | Freq: Once | INTRAVENOUS | Status: AC
  Administered 2017-05-19: 500 mL via INTRAVENOUS

## 2017-05-19 MED ORDER — DEXTROSE-NACL 5-0.9 % IV SOLN: INTRAVENOUS | Status: DC

## 2017-05-19 MED ORDER — DEXTROSE 5 % IV SOLN
1.0000 g | INTRAVENOUS | Status: DC
  Administered 2017-05-19 – 2017-05-20 (×2): 1 g via INTRAVENOUS
  Filled 2017-05-19 (×2): qty 10

## 2017-05-19 MED ORDER — DEXTROSE-NACL 5-0.9 % IV SOLN
INTRAVENOUS | Status: AC
  Administered 2017-05-19 – 2017-05-20 (×2): via INTRAVENOUS

## 2017-05-19 MED ORDER — DEXTROSE-NACL 5-0.9 % IV SOLN
INTRAVENOUS | Status: DC
  Administered 2017-05-19: 13:00:00 via INTRAVENOUS

## 2017-05-19 NOTE — Care Management CC44 (Signed)
Condition Code 44 Documentation Completed  Patient Details  Name: Craig Giles MRN: [id number] Date of Birth: 01/19/1931   Condition Code 44 given:  Yes Patient signature on Condition Code 44 notice:  Yes Documentation of 2 MD's agreement:  Yes Code 44 added to claim:  Yes    Lanier ClamMahabir, Pernie Grosso, RN 05/19/2017, 3:10 PM

## 2017-05-19 NOTE — Care Management Obs Status (Signed)
MEDICARE OBSERVATION STATUS NOTIFICATION   Patient Details  Name: Craig DankerRaymond Delucchi MRN: [id number] Date of Birth: 06/14/1931   Medicare Observation Status Notification Given:  Yes    Lanier ClamMahabir, Santia Labate, RN 05/19/2017, 3:09 PM

## 2017-05-19 NOTE — Clinical Social Work Note (Signed)
Clinical Social Work Assessment  Patient Details  Name: Craig Giles MRN: [id number] Date of Birth: 03/11/1931  Date of referral:  05/19/17               Reason for consult:  Facility Placement                Permission sought to share information with:  Facility Industrial/product designerContact Representative Permission granted to share information::  Yes, Verbal Permission Granted  Name::        Agency::     Relationship::     Contact Information:     Housing/Transportation Living arrangements for the past 2 months:  Assisted Living Facility Source of Information:  Spouse Data processing manager(Roseann Mey) Patient Interpreter Needed:  None Criminal Activity/Legal Involvement Pertinent to Current Situation/Hospitalization:  No - Comment as needed Significant Relationships:  Spouse Lives with:  Facility Resident Do you feel safe going back to the place where you live?  Yes Need for family participation in patient care:  Yes (Comment)  Care giving concerns:  [facility]. Patient unable to ambulate.    Social Worker assessment / plan:  CSW spoke with patient's wife Verita Lamb(Roseann [place] [phone]), patient alert and only oriented to person. Patient's wife reported the [facility] Patient's wife reported that the patient is happy at the ALF. Patient's wife reported that the plan is for patient to discharge back to current ALF. CSW contacted [facility] and staff confirmed patient's ability to return. CSW will complete FL2 and assist with discharge planning. PT evaluation pending, CSW will follow up with patient's wife regarding PT recommendation.  Employment status:  Retired Database administratornsurance information:  Managed Medicare PT Recommendations:  Not assessed at this time Information / Referral to community resources:  Other (Comment Required) (Assisted Living Facility)  Patient/Family's Response to care:   Patient's wife agreeable to patient returning to current ALF.   Patient/Family's Understanding of and Emotional Response to Diagnosis, Current Treatment, and Prognosis:  Patient's wife reported that she would be coming to the hospital today to find out about patient's diagnosis and treatment. Patient's wife active in patient's care, noting she visits patient daily at ALF.   Emotional Assessment Appearance:    Attitude/Demeanor/Rapport:  Unable to Assess Affect (typically observed):  Unable to Assess Orientation:  Oriented to Self Alcohol / Substance use:  Never Used Psych involvement (Current and /or in the community):  No (Comment)  Discharge Needs  Concerns to be addressed:  No discharge needs identified Readmission within the last 30 days:  No Current discharge risk:  None Barriers to Discharge:  No Barriers Identified   Antionette PolesKimberly L Johnnetta Holstine, LCSW 05/19/2017, 12:53 PM

## 2017-05-19 NOTE — Care Management Note (Signed)
Case Management Note  Patient Details  Name: Craig Giles MRN: [id number] Date of Birth: 12/18/1930  Subjective/Objective: Issued CC44,ABN to patient/spouse(voiced understanding) dtr was in the rm also. CSW now following for PT recc SNF.                   Action/Plan:d/c SNF.   Expected Discharge Date:                  Expected Discharge Plan:  Skilled Nursing Facility  In-House Referral:  Clinical Social Work  Discharge planning Services  CM Consult  Post Acute Care Choice:    Choice offered to:     DME Arranged:    DME Agency:     HH Arranged:    HH Agency:     Status of Service:  In process, will continue to follow  If discussed at Long Length of Stay Meetings, dates discussed:    Additional Comments:  Craig Giles, Craig Rudnicki, RN 05/19/2017, 3:15 PM

## 2017-05-19 NOTE — Progress Notes (Signed)
CSW spoke with patient,patient's daughter and patient's wife at bedside regarding PT recommendation for SNF. Patient's wife reported that she would like the patient to go back to current ALF and is not interested in SNF. CSW will assist patient with discharge planning back to current ALF when medically stable.   Celso SickleKimberly Russell Quinney, [place]CSWA Clinical Social Worker [facility] Cell#: [phone]

## 2017-05-19 NOTE — Evaluation (Signed)
Physical Therapy Evaluation Patient Details Name: Craig Giles MRN: [id number] DOB: 02/20/1931 Today's Date: 05/19/2017   History of Present Illness  83 yo male admitted with GI bleed. Hx of Parkinson's disease, hypothyroidism.   Clinical Impression  On eval, pt required Max assist for mobility. With increased time and multimodal cueing, he was able to stand and take a few side steps along the side of the bed with a RW. Pt did not initiate any mobility tasks despite cues to do so. Posterior bias in sitting and standing noted. No family present during session. Recommend SNF at this time.     Follow Up Recommendations SNF    Equipment Recommendations  None recommended by PT    Recommendations for Other Services       Precautions / Restrictions Precautions Precautions: Fall Restrictions Weight Bearing Restrictions: No      Mobility  Bed Mobility Overal bed mobility: Needs Assistance Bed Mobility: Rolling;Sit to Sidelying;Supine to Sit Rolling: Max assist   Supine to sit: Max assist;HOB elevated   Sit to sidelying: Max assist General bed mobility comments: Increased time and repeated cueing for all tasks. Pt did not initiate any tasks. Assist for positinong hand on handrail,. trunk, and bil LEs. Utilized bedpad for scooting, positioning.   Transfers Overall transfer level: Needs assistance Equipment used: Rolling walker (2 wheeled) Transfers: Sit to/from Stand Sit to Stand: Max assist;From elevated surface         General transfer comment: Increased time and repeated cueing for all tasks. Pt did not initiate any tasks. Assist to position feet, hands, rise, stabilize, control descent.   Ambulation/Gait Ambulation/Gait assistance: Mod assist   Assistive device: Rolling walker (2 wheeled)       General Gait Details: Side steps along side of bed with RW. Increased time required. Assist to support/stabilize pt and maneuver with RW. Did not have +2 to safely attempt  ambulation away from bed.  Stairs            Wheelchair Mobility    Modified Rankin (Stroke Patients Only)       Balance Overall balance assessment: Needs assistance   Sitting balance-Leahy Scale: Poor Sitting balance - Comments: Cues for pt to use UEs to aid with sitting balance. He held on to bedrail but still required assist to prevent LOB posteriorly. Poor anterior weight shifting noted. Postural control: Posterior lean   Standing balance-Leahy Scale: Poor                               Pertinent Vitals/Pain Pain Assessment: No/denies pain    Home Living Family/patient expects to be discharged to:: Skilled nursing facility                      Prior Function Level of Independence: Needs assistance         Comments: unsure of PLOF. No family present to provide history. Pt appears confused     Hand Dominance        Extremity/Trunk Assessment   Upper Extremity Assessment Upper Extremity Assessment: Difficult to assess due to impaired cognition    Lower Extremity Assessment Lower Extremity Assessment: Difficult to assess due to impaired cognition    Cervical / Trunk Assessment Cervical / Trunk Assessment: Kyphotic  Communication   Communication: No difficulties  Cognition Arousal/Alertness: Awake/alert Behavior During Therapy: Flat affect Overall Cognitive Status: No family/caregiver present to determine baseline cognitive functioning  General Comments      Exercises     Assessment/Plan    PT Assessment Patient needs continued PT services  PT Problem List Decreased strength;Decreased mobility;Decreased activity tolerance;Decreased balance;Decreased knowledge of use of DME;Decreased coordination;Decreased cognition       PT Treatment Interventions DME instruction;Gait training;Therapeutic activities;Therapeutic exercise;Patient/family education;Functional mobility  training;Balance training    PT Goals (Current goals can be found in the Care Plan section)  Acute Rehab PT Goals Patient Stated Goal: to eat some pumpernickel bread PT Goal Formulation: With patient Time For Goal Achievement: 06/02/17 Potential to Achieve Goals: Fair    Frequency Min 3X/week   Barriers to discharge        Co-evaluation               AM-PAC PT "6 Clicks" Daily Activity  Outcome Measure Difficulty turning over in bed (including adjusting bedclothes, sheets and blankets)?: Total Difficulty moving from lying on back to sitting on the side of the bed? : Total Difficulty sitting down on and standing up from a chair with arms (e.g., wheelchair, bedside commode, etc,.)?: Total Help needed moving to and from a bed to chair (including a wheelchair)?: Total Help needed walking in hospital room?: Total Help needed climbing 3-5 steps with a railing? : Total 6 Click Score: 6    End of Session Equipment Utilized During Treatment: Gait belt Activity Tolerance: Patient tolerated treatment well Patient left: in bed;with call bell/phone within reach;with bed alarm set   PT Visit Diagnosis: Difficulty in walking, not elsewhere classified (R26.2);Muscle weakness (generalized) (M62.81)    Time: 4696-2952 PT Time Calculation (min) (ACUTE ONLY): 29 min   Charges:   PT Evaluation $PT Eval Moderate Complexity: 1 Procedure PT Treatments $Therapeutic Activity: 8-22 mins   PT G Codes:          Rebeca Alert, MPT Pager: [phone]

## 2017-05-19 NOTE — Consult Note (Signed)
Subjective:   HPI  The patient is an 83 year old male who was admitted to the hospital because of rectal bleeding which began yesterday. He states it was associated with some abdominal cramping. He resides in a nursing home. He did not vomit blood. He has not had any rectal bleeding this morning from what he tells me. He has never had a colonoscopy.  Review of Systems Denies chest pain or shortness of breath  Past Medical History:  Diagnosis Date  . Hypothyroidism   . Memory deficits 10/22/2013  . Parkinson disease (HCC)   . 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 TONSILLECTOMY     Social History   Social History  . Marital status: Married    Spouse name: N/A  . Number of children: 3  . Years of education: HS   Occupational History  .      Social History Main Topics  . Smoking status: Never Smoker  . Smokeless tobacco: Never Used  . Alcohol use No  . Drug use: No  . Sexual activity: Not on file   Other Topics Concern  . Not on file   Social History Narrative   Lives w/ wife.   Patient is right handed.   Patient drinks 1 cup of caffeine per day.   family history includes Diabetes in his brother.  Current Facility-Administered Medications:  .  0.9 %  sodium chloride infusion, 250 mL, Intravenous, PRN, Osei-Bonsu, George, MD .  carbidopa-levodopa (SINEMET IR) 25-100 MG per tablet immediate release 1 tablet, 1 tablet, Oral, TID, Osei-Bonsu, George, MD, 1 tablet at 05/18/17 2101 .  ciprofloxacin (CIPRO) IVPB 200 mg, 200 mg, Intravenous, Q12H, Jackie Plumsei-Bonsu, George, MD, Stopped at 05/19/17 0005 .  dextrose 5 %-0.9 % sodium chloride infusion, , Intravenous, Continuous, Osei-Bonsu, George, MD, Last Rate: 125 mL/hr at 05/19/17 0503 .  levothyroxine (SYNTHROID, LEVOTHROID) tablet 88 mcg, 88 mcg, Oral, QAC breakfast, Osei-Bonsu, George, MD .  multivitamin with minerals tablet 1 tablet, 1 tablet, Oral, Daily, Osei-Bonsu,  George, MD, 1 tablet at 05/18/17 1124 .  ondansetron (ZOFRAN) tablet 4 mg, 4 mg, Oral, Q6H PRN **OR** ondansetron (ZOFRAN) injection 4 mg, 4 mg, Intravenous, Q6H PRN, Osei-Bonsu, George, MD .  rOPINIRole (REQUIP) tablet 5 mg, 5 mg, Oral, Daily, Osei-Bonsu, George, MD, 5 mg at 05/18/17 1803 .  sodium chloride flush (NS) 0.9 % injection 3 mL, 3 mL, Intravenous, Q12H, Osei-Bonsu, George, MD, 3 mL at 05/18/17 1124 .  sodium chloride flush (NS) 0.9 % injection 3 mL, 3 mL, Intravenous, PRN, Osei-Bonsu, George, MD .  tamsulosin (FLOMAX) capsule 0.4 mg, 0.4 mg, Oral, Daily, Osei-Bonsu, George, MD, 0.4 mg at 05/18/17 1124 .  traZODone (DESYREL) tablet 100 mg, 100 mg, Oral, QHS, Osei-Bonsu, George, MD, 100 mg at 05/18/17 2101 .  traZODone (DESYREL) tablet 25 mg, 25 mg, Oral, QHS PRN, Osei-Bonsu, George, MD .  zinc sulfate capsule 220 mg, 220 mg, Oral, Daily, Osei-Bonsu, George, MD, 220 mg at 05/18/17 1124 No Known Allergies   Objective:     BP (!) 123/48 (BP Location: Left Arm)   Pulse (!) 44   Temp 98.4 F (36.9 C) (Oral)   Resp 16   Ht 5\' 5"  (1.651 m)   Wt 61.2 kg (135 lb)   SpO2 98%   BMI 22.47 kg/m   He does not appear in any acute distress  Nonicteric  Heart regular rhythm no murmurs  Lungs clear  Abdomen: Bowel sounds  present, soft, nontender, no obvious hepatosplenomegaly    Laboratory No components found for: D1    Assessment:     Rectal bleeding. The etiology of this rectal bleeding is unclear. Possibilities could include hemorrhoids, diverticulosis, polyp, colon tumor,      Plan:     I discussed this with him. I asked him if he was interested in pursuing further evaluation for this such as colonoscopy. He told me no. In light of this I would recommend observation. He does not wish to pursue further evaluation. If he has extensive bleeding I would do a nuclear medicine GI bleeding scan as the first step. Given his wishes we will not pursue elective evaluation of this  rectal bleeding. Lab Results  Component Value Date   HGB 10.3 (L) 05/19/2017   HGB 9.7 (L) 05/19/2017   HGB 9.9 (L) 05/18/2017   HCT 32.2 (L) 05/19/2017   HCT 30.0 (L) 05/19/2017   HCT 30.5 (L) 05/18/2017   ALKPHOS 61 04/01/2017   ALKPHOS 68 03/31/2017   AST 23 04/01/2017   AST 33 03/31/2017   ALT 5 (L) 04/01/2017   ALT 6 (L) 03/31/2017

## 2017-05-19 NOTE — Care Management Note (Signed)
Case Management Note  Patient Details  Name: Craig Giles MRN: [id number] Date of Birth: 11/11/1931  Subjective/Objective:  83 y/o m admitted w/GIB. From [facility]-CSW already following.PT cons-await recc.GI following.                  Action/Plan:d/c plan ALF.   Expected Discharge Date:                  Expected Discharge Plan:  Assisted Living / Rest Home  In-House Referral:  Clinical Social Work  Discharge planning Services  CM Consult  Post Acute Care Choice:    Choice offered to:     DME Arranged:    DME Agency:     HH Arranged:    HH Agency:     Status of Service:  In process, will continue to follow  If discussed at Long Length of Stay Meetings, dates discussed:    Additional Comments:  Lanier ClamMahabir, Ebert Forrester, RN 05/19/2017, 11:12 AM

## 2017-05-19 NOTE — Progress Notes (Signed)
   05/19/17 1249  PT Time Calculation  PT Start Time (ACUTE ONLY) 1206  PT Stop Time (ACUTE ONLY) 1235  PT Time Calculation (min) (ACUTE ONLY) 29 min  PT G-Codes **NOT FOR INPATIENT CLASS**  Functional Assessment Tool Used AM-PAC 6 Clicks Basic Mobility;Clinical judgement  Functional Limitation Mobility: Walking and moving around  Mobility: Walking and Moving Around Current Status (Z6109(G8978) CL  Mobility: Walking and Moving Around Goal Status (U0454(G8979) CJ  PT General Charges  $$ ACUTE PT VISIT 1 Procedure  PT Evaluation  $PT Eval Moderate Complexity 1 Procedure  PT Treatments  $Therapeutic Activity 8-22 mins   Rebeca AlertJannie Fleming Prill, MPT [phone]

## 2017-05-19 NOTE — Progress Notes (Signed)
Patient HR has dropped as low as 39, currently staying around 46. Current rhythm SB. BP also soft, 92/53. Patient sleeping but arouses to voice. No signs of distress. O2 Sats stable at 97% RA. Patient has had 2 loose stools this shift, small/medium amounts of tan mucus with bright red streaks. Hgb did drop to 9.9. On-call MD Made aware and ordered STAT CBC and Lactic, as well as 500 ml NS bolus. Will continue to monitor closely.

## 2017-05-19 NOTE — Progress Notes (Signed)
[facility] PROGRESS NOTE  Craig Giles ZOX:[id number] DOB: Apr 28, 1931 DOA: 05/18/2017 PCP: Craig Ora, MD  Brief summary  83 y.o. male with medical history significant for Parkinson's disease, hypothyroidism, MCI presenting with one-day history of an episode of bright red blood per rectum noted by nursing staff at the facility without nausea vomiting abdominal pain fever or chills, no dysuria.  ED Course: At the ED patient was hemodynamically stable with H&H of 12.5 and 37.7. Rectal exam indicated bloody mucus discharge around the rectum with light brown stool noted.  Assessment/Plan:  Episode of rectal bleeding. No recurrence. Hg is stable/improved. Questionable diverticular bleed vs hemorrhoidal. Will cont monitor. He declined colonoscopy, will obtain nuc med gi scan if re bleeding.  -also episode of diarrhea, will check gi pathogen if recurrent diarrhea    Bradycardia. appears sinus, asymptomatic. Check echo. Will cont monitor   Mild blood loss anemia. No recurrence. Will monitor, check iron profile  UTI. Cont iv antibiotic treatment, pend cultures  Parkinson's disease. Cont home regimen    Code Status: full Family Communication: d/w patient, rn (indicate person spoken with, relationship, and if by phone, the number) Disposition Plan: pend clinical improvement,    Consultants:  GI  Procedures:  Pend echo  Antibiotics: ciro 6/17  Ceftriaxone 6/18>>> (indicate start date, and stop date if known)  HPI/Subjective: Alert, no distress. No new episodes of bleeding overnight, but mild diarrhea   Objective: Vitals:   05/19/17 0227 05/19/17 0518  BP: (!) 100/51 (!) 123/48  Pulse: (!) 44 (!) 44  Resp: 14 16  Temp:  98.4 F (36.9 C)    Intake/Output Summary (Last 24 hours) at 05/19/17 1131 Last data filed at 05/19/17 0600  Gross per 24 hour  Intake          3007.08 ml  Output             1500 ml  Net          1507.08 ml   Filed Weights   05/18/17  0157  Weight: 61.2 kg (135 lb)    Exam:   General:  No distress   Cardiovascular: s1,s2 rrr  Respiratory: CTA BL   Abdomen: soft, nt, nd   Musculoskeletal: no leg edema    Data Reviewed: Basic Metabolic Panel:  Recent Labs Lab 05/18/17 0247 05/19/17 0435  NA 139 139  K 4.3 3.8  CL 106 107  CO2 25 27  GLUCOSE 110* 105*  BUN 18 11  CREATININE 1.15 0.95  CALCIUM 9.2 8.8*   Liver Function Tests: No results for input(s): AST, ALT, ALKPHOS, BILITOT, PROT, ALBUMIN in the last 168 hours. No results for input(s): LIPASE, AMYLASE in the last 168 hours. No results for input(s): AMMONIA in the last 168 hours. CBC:  Recent Labs Lab 05/18/17 0247 05/18/17 1649 05/18/17 2012 05/19/17 0145 05/19/17 0435  WBC 12.5*  --  7.3 7.1 7.2  NEUTROABS 11.1*  --   --   --   --   HGB 12.5* 11.2* 9.9* 9.7* 10.3*  HCT 37.7* 34.1* 30.5* 30.0* 32.2*  MCV 87.3  --  88.2 89.0 89.0  PLT 215  --  169 148* 154   Cardiac Enzymes: No results for input(s): CKTOTAL, CKMB, CKMBINDEX, TROPONINI in the last 168 hours. BNP (last 3 results)  Recent Labs  03/31/17 1150  BNP 246.1*    ProBNP (last 3 results) No results for input(s): PROBNP in the last 8760 hours.  CBG:  Recent Labs Lab 05/18/17  1620 05/18/17 2310 05/19/17 0731  GLUCAP 108* 98 115*    Recent Results (from the past 240 hour(s))  Urine culture     Status: None (Preliminary result)   Collection Time: 05/18/17  5:54 AM  Result Value Ref Range Status   Specimen Description URINE, RANDOM  Final   Special Requests NONE  Final   Culture   Final    CULTURE REINCUBATED FOR BETTER GROWTH Performed at [facility], [address] [place], [place] [address]    Report Status PENDING  Incomplete  MRSA PCR Screening     Status: None   Collection Time: 05/18/17  1:39 PM  Result Value Ref Range Status   MRSA by PCR NEGATIVE NEGATIVE Final    Comment:        The GeneXpert MRSA Assay (FDA approved for NASAL  specimens only), is one component of a comprehensive MRSA colonization surveillance program. It is not intended to diagnose MRSA infection nor to guide or monitor treatment for MRSA infections.      Studies: No results found.  Scheduled Meds: . carbidopa-levodopa  1 tablet Oral TID  . levothyroxine  88 mcg Oral QAC breakfast  . multivitamin with minerals  1 tablet Oral Daily  . ropinirole  5 mg Oral Daily  . sodium chloride flush  3 mL Intravenous Q12H  . tamsulosin  0.4 mg Oral Daily  . traZODone  100 mg Oral QHS  . zinc sulfate  220 mg Oral Daily   Continuous Infusions: . sodium chloride    . cefTRIAXone (ROCEPHIN)  IV    . dextrose 5 % and 0.9% NaCl 125 mL/hr at 05/19/17 0503    Active Problems:   GI bleed    Time spent: >35 minutes     Craig Giles, Craig Giles  [facility] Pager [phone]. If 7PM-7AM, please contact night-coverage at [email or web address], password [email or web address] 05/19/2017, 11:31 AM  LOS: 1 day

## 2017-05-19 NOTE — Progress Notes (Signed)
BP 100/51 after 500 ml bolus, HR continues to be 38-44. Still in SB. Patient continues to arouse to voice/touch but is sleeping. Pt confused so unable to tell if he feels weak or symptomatic. Hgb stable at 9.7, Lactic Acid 1.0. MD aware. No new orders at this time.

## 2017-05-20 ENCOUNTER — Observation Stay (HOSPITAL_COMMUNITY): Payer: Medicare Other

## 2017-05-20 DIAGNOSIS — J80 Acute respiratory distress syndrome: Secondary | ICD-10-CM | POA: Diagnosis not present

## 2017-05-20 DIAGNOSIS — Z960 Presence of urogenital implants: Secondary | ICD-10-CM | POA: Diagnosis not present

## 2017-05-20 DIAGNOSIS — K625 Hemorrhage of anus and rectum: Secondary | ICD-10-CM | POA: Diagnosis not present

## 2017-05-20 DIAGNOSIS — Z978 Presence of other specified devices: Secondary | ICD-10-CM

## 2017-05-20 DIAGNOSIS — K922 Gastrointestinal hemorrhage, unspecified: Secondary | ICD-10-CM | POA: Diagnosis not present

## 2017-05-20 DIAGNOSIS — R1084 Generalized abdominal pain: Secondary | ICD-10-CM | POA: Diagnosis not present

## 2017-05-20 LAB — CBC
HCT: 35.3 % — ABNORMAL LOW (ref 39.0–52.0)
Hemoglobin: 11.4 g/dL — ABNORMAL LOW (ref 13.0–17.0)
MCH: 29.1 pg (ref 26.0–34.0)
MCHC: 32.3 g/dL (ref 30.0–36.0)
MCV: 90.1 fL (ref 78.0–100.0)
Platelets: 169 10*3/uL (ref 150–400)
RBC: 3.92 MIL/uL — AB (ref 4.22–5.81)
RDW: 15 % (ref 11.5–15.5)
WBC: 7.8 10*3/uL (ref 4.0–10.5)

## 2017-05-20 LAB — GLUCOSE, CAPILLARY: GLUCOSE-CAPILLARY: 129 mg/dL — AB (ref 65–99)

## 2017-05-20 MED ORDER — LEVOFLOXACIN 500 MG PO TABS: 500.0000 mg | ORAL_TABLET | Freq: Every day | ORAL | 0 refills | Status: AC

## 2017-05-20 NOTE — Progress Notes (Signed)
Patient returning to current [facility]. CSW faxed all requested clinical documents, staff member Arline AspCindy confirmed they were received. PTAR contacted, family aware. Patient's RN can call report to [phone]. CSW signing off, no other needs identified at this time. Please consult if new needs arise.   Craig Giles, [place] Clinical Social Worker [facility] Cell#: [phone]

## 2017-05-20 NOTE — NC FL2 (Signed)
[place] MEDICAID FL2 LEVEL OF CARE SCREENING TOOL     IDENTIFICATION  Patient Name: Craig Giles Birthdate: 04-11-1931 Sex: male Admission Date (Current Location): 05/18/2017  [facility] and [place] Number:  Producer, television/film/video and Address:  [facility],  [address], [place] [address]      Provider Number: [id number]  Attending Physician Name and Address:  Maxie Barb, MD  Relative Name and Phone Number:       Current Level of Care: Hospital Recommended Level of Care: Assisted Living Facility Prior Approval Number:    Date Approved/Denied:   PASRR Number:  [id number]   Discharge Plan: Other (Comment) (Assisted Living Facility )    Current Diagnoses: Patient Active Problem List   Diagnosis Date Noted  . GI bleed 05/18/2017  . Pressure injury of skin 04/01/2017  . PNA (pneumonia) 03/31/2017  . Chronic insomnia 08/12/2016  . 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 Memory deficits 10/22/2013  . Abnormality of gait 11/10/2012  . Paralysis agitans (HCC) 11/10/2012    Orientation RESPIRATION BLADDER Height & Weight     Self  Normal Indwelling catheter Weight: 135 lb (61.2 kg) Height:  5\' 5"  (165.1 cm)  BEHAVIORAL SYMPTOMS/MOOD NEUROLOGICAL BOWEL NUTRITION STATUS      Incontinent Diet Mechanical Soft  AMBULATORY STATUS COMMUNICATION OF NEEDS Skin   Extensive Assist Verbally Other (Comment), PU Stage and Appropriate Care ( PressureInjuryStageII-Partialthicknesslossofdermispresentingasashallowopenulcerwithared,pinkwoundbedwithoutslough.   Location: Sacrum Location Orientation: Medial  Dressing type Foam)                             Personal Care Assistance Level of Assistance  Bathing, Feeding, Dressing Bathing Assistance: Limited assistance Feeding assistance: Limited assistance Dressing Assistance: Limited assistance     Functional Limitations Info             SPECIAL CARE FACTORS FREQUENCY    PT (By licensed PT), OT (By licensed OT)     PT Frequency: Home Health PT OT Frequency: Home Health OT            Contractures Contractures Info: Not present    Additional Factors Info  Code Status, Allergies Code Status Info: Full Code Allergies Info: NKA              Discharge Medications:    Medication List    TAKE these medications   acetaminophen 325 MG tablet Commonly known as:  TYLENOL Take 650 mg by mouth every 6 (six) hours as needed for mild pain, moderate pain, fever or headache.   carbidopa-levodopa 25-100 MG tablet Commonly known as:  SINEMET IR Take 1 tablet by mouth 3 (three) times daily.   levofloxacin 500 MG tablet Commonly known as:  LEVAQUIN Take 1 tablet (500 mg total) by mouth daily.   levothyroxine 88 MCG tablet Commonly known as:  SYNTHROID, LEVOTHROID Take 88 mcg by mouth daily before breakfast.   multivitamin with minerals Tabs tablet Take 1 tablet by mouth daily.   polyethylene glycol packet Commonly known as:  MIRALAX / GLYCOLAX Take 17 g by mouth daily.   ropinirole 5 MG tablet Commonly known as:  REQUIP Take 5 mg by mouth daily.   saccharomyces boulardii 250 MG capsule Commonly known as:  FLORASTOR Take 250 mg by mouth daily.   tamsulosin 0.4 MG Caps capsule Commonly known as:  FLOMAX Take 1 capsule (0.4 mg total) by mouth daily.   traZODone 100 MG tablet Commonly known as:  DESYREL Take 1 tablet (100 mg total) by mouth at bedtime.   vitamin C 250 MG tablet Commonly known as:  ASCORBIC ACID Take 250 mg by mouth daily.   zinc sulfate 220 (50 Zn) MG capsule Take 220 mg by mouth daily.     Relevant Imaging Results:  Relevant Lab Results:   Additional Information ss#[id number].                   Home Health Nursing for Catheter change  Antionette PolesKimberly L Imani Fiebelkorn, LCSW

## 2017-05-20 NOTE — Progress Notes (Signed)
Added wound care [facility] Kim aware of orders.

## 2017-05-20 NOTE — Discharge Summary (Addendum)
Physician Discharge Summary  Craig Giles ZOX:[id number] DOB: 1931/03/02 DOA: 05/18/2017  PCP: Willow Ora, MD  Admit date: 05/18/2017 Discharge date: 05/20/2017  Admitted From:ALF Disposition:ALF  Recommendations for Outpatient Follow-up:  1. Follow up with PCP and GI in 1-2 weeks 2. Please obtain BMP/CBC in one week  Home Health:ALF Equipment/Devices:chronic foley catheter Discharge Condition:stable CODE STATUS:full Diet recommendation:heart healthy  Brief/Interim Summary: 83 y.o.malewith medical history significant for Parkinson's disease, hypothyroidism presenting with one-day history of an episode of bright red blood per rectum noted by nursing staff at the facility without nausea vomiting abdominal pain fever or chills, no dysuria.  ED Course:At the ED patient was hemodynamically stable with H&H of 12.5 and 37.7. Rectal exam indicated bloody mucus discharge around the rectum with light brown stool noted.  Episode of rectal bleeding: No further recurrence in the hospital. Questionable diverticular bleed versus hemorrhoidal. Hemoglobin is stable. He has no fever, chills, nausea vomiting or abdominal pain. Tolerating diet well. He declined colonoscopy. Evaluated by GI . I discussed with Dr.Ganem today and he recommended that patient can be discharged with outpatient follow-up.  Likely UTI associated with  indwelling Foley catheter versus colonization: Urine culture growing pseudomonas. We discharged with 5 days of oral Levaquin. Recommended to follow-up with PCP for final culture result.  Patient is a stable, no sign of bleeding, hemoglobin is stable. Discharge to ALF in stable condition. Discussed with the case IT consultant and nursing staffs.  Declined skilled NF and wanted to go ALF. Home care PT/OT/RN ordered  Discharge Diagnoses:  Active Problems:   GI bleed   Indwelling Foley catheter present   Rectal bleeding    Discharge Instructions  Discharge  Instructions    Call MD for:  difficulty breathing, headache or visual disturbances    Complete by:  As directed    Call MD for:  extreme fatigue    Complete by:  As directed    Call MD for:  hives    Complete by:  As directed    Call MD for:  persistant dizziness or light-headedness    Complete by:  As directed    Call MD for:  persistant nausea and vomiting    Complete by:  As directed    Call MD for:  severe uncontrolled pain    Complete by:  As directed    Call MD for:  temperature >100.4    Complete by:  As directed    Diet - low sodium heart healthy    Complete by:  As directed    Discharge instructions    Complete by:  As directed    Please follow-up with your PCP and GI. Please contact her doctor if you notice bleeding.   Face-to-face encounter (required for Medicare/Medicaid patients)    Complete by:  As directed    I Dron Jaynie Collins certify that this patient is under my care and that I, or a nurse practitioner or physician's assistant working with me, had a face-to-face encounter that meets the physician face-to-face encounter requirements with this patient on 05/20/2017. The encounter with the patient was in whole, or in part for the following medical condition(s) which is the primary reason for home health care (List medical condition): change foley catheter   The encounter with the patient was in whole, or in part, for the following medical condition, which is the primary reason for home health care:  GI bleeding, change foley catheter   I certify that, based on my findings,  the following services are medically necessary home health services:   Nursing Physical therapy     Reason for Medically Necessary Home Health Services:  Skilled Nursing- Skilled Assessment/Observation   My clinical findings support the need for the above services:  OTHER SEE COMMENTS   Further, I certify that my clinical findings support that this patient is homebound due to:  Unable to leave home  safely without assistance   Home Health    Complete by:  As directed    To provide the following care/treatments:   PT OT RN     Increase activity slowly    Complete by:  As directed      Allergies as of 05/20/2017   No Known Allergies     Medication List    TAKE these medications   acetaminophen 325 MG tablet Commonly known as:  TYLENOL Take 650 mg by mouth every 6 (six) hours as needed for mild pain, moderate pain, fever or headache.   carbidopa-levodopa 25-100 MG tablet Commonly known as:  SINEMET IR Take 1 tablet by mouth 3 (three) times daily.   levofloxacin 500 MG tablet Commonly known as:  LEVAQUIN Take 1 tablet (500 mg total) by mouth daily.   levothyroxine 88 MCG tablet Commonly known as:  SYNTHROID, LEVOTHROID Take 88 mcg by mouth daily before breakfast.   multivitamin with minerals Tabs tablet Take 1 tablet by mouth daily.   polyethylene glycol packet Commonly known as:  MIRALAX / GLYCOLAX Take 17 g by mouth daily.   ropinirole 5 MG tablet Commonly known as:  REQUIP Take 5 mg by mouth daily.   saccharomyces boulardii 250 MG capsule Commonly known as:  FLORASTOR Take 250 mg by mouth daily.   tamsulosin 0.4 MG Caps capsule Commonly known as:  FLOMAX Take 1 capsule (0.4 mg total) by mouth daily.   traZODone 100 MG tablet Commonly known as:  DESYREL Take 1 tablet (100 mg total) by mouth at bedtime.   vitamin C 250 MG tablet Commonly known as:  ASCORBIC ACID Take 250 mg by mouth daily.   zinc sulfate 220 (50 Zn) MG capsule Take 220 mg by mouth daily.      Follow-up Information    Willow Ora, MD. Schedule an appointment as soon as possible for a visit today.   Specialty:  Family Medicine Why:  for follow up on urinary culture Contact information: [address] [place] [address] [phone]        Gastroenterology, Deboraha Sprang. Schedule an appointment as soon as possible for a visit today.   Why:  for further  outpatient evaluation of rectal bleeding. Contact information: [address] [place] [address] [phone]          No Known Allergies  Consultations: GI  Procedures/Studies: None  Subjective: Seen and examined at bedside. Wanted to go to ALF. Denied headache, dizziness, nausea vomiting chest pain or shortness of breath.  Discharge Exam: Vitals:   05/19/17 2122 05/20/17 0631  BP: (!) 111/55 137/69  Pulse: (!) 54 (!) 49  Resp: 18 18  Temp: 98.5 F (36.9 C) 98.2 F (36.8 C)   Vitals:   05/19/17 0518 05/19/17 1511 05/19/17 2122 05/20/17 0631  BP: (!) 123/48 (!) 92/47 (!) 111/55 137/69  Pulse: (!) 44 (!) 58 (!) 54 (!) 49  Resp: 16 18 18 18   Temp: 98.4 F (36.9 C) 98.9 F (37.2 C) 98.5 F (36.9 C) 98.2 F (36.8 C)  TempSrc:  Oral Oral Oral Oral  SpO2: 98% 98% 98% 99%  Weight:      Height:        General: Pt is alert, awake, not in acute distress Cardiovascular: RRR, S1/S2 +, no rubs, no gallops Respiratory: CTA bilaterally, no wheezing, no rhonchi Abdominal: Soft, NT, ND, bowel sounds + Extremities: no edema, no cyanosis Has indwelling Foley catheter.   The results of significant diagnostics from this hospitalization (including imaging, microbiology, ancillary and laboratory) are listed below for reference.     Microbiology: Recent Results (from the past 240 hour(s))  Urine culture     Status: Abnormal (Preliminary result)   Collection Time: 05/18/17  5:54 AM  Result Value Ref Range Status   Specimen Description URINE, RANDOM  Final   Special Requests NONE  Final   Culture (A)  Final    10,000 COLONIES/mL PSEUDOMONAS AERUGINOSA SUSCEPTIBILITIES TO FOLLOW Performed at [facility], [address] [place], [place] [address]    Report Status PENDING  Incomplete  MRSA PCR Screening     Status: None   Collection Time: 05/18/17  1:39 PM  Result Value Ref Range Status   MRSA by PCR NEGATIVE NEGATIVE Final    Comment:        The  GeneXpert MRSA Assay (FDA approved for NASAL specimens only), is one component of a comprehensive MRSA colonization surveillance program. It is not intended to diagnose MRSA infection nor to guide or monitor treatment for MRSA infections.      Labs: BNP (last 3 results)  Recent Labs  03/31/17 1150  BNP 246.1*   Basic Metabolic Panel:  Recent Labs Lab 05/18/17 0247 05/19/17 0435  NA 139 139  K 4.3 3.8  CL 106 107  CO2 25 27  GLUCOSE 110* 105*  BUN 18 11  CREATININE 1.15 0.95  CALCIUM 9.2 8.8*   Liver Function Tests: No results for input(s): AST, ALT, ALKPHOS, BILITOT, PROT, ALBUMIN in the last 168 hours. No results for input(s): LIPASE, AMYLASE in the last 168 hours. No results for input(s): AMMONIA in the last 168 hours. CBC:  Recent Labs Lab 05/18/17 0247  05/18/17 2012 05/19/17 0145 05/19/17 0435 05/19/17 1842 05/20/17 0810  WBC 12.5*  --  7.3 7.1 7.2  --  7.8  NEUTROABS 11.1*  --   --   --   --   --   --   HGB 12.5*  < > 9.9* 9.7* 10.3* 10.0* 11.4*  HCT 37.7*  < > 30.5* 30.0* 32.2* 30.9* 35.3*  MCV 87.3  --  88.2 89.0 89.0  --  90.1  PLT 215  --  169 148* 154  --  169  < > = values in this interval not displayed. Cardiac Enzymes: No results for input(s): CKTOTAL, CKMB, CKMBINDEX, TROPONINI in the last 168 hours. BNP: Invalid input(s): POCBNP CBG:  Recent Labs Lab 05/18/17 1620 05/18/17 2310 05/19/17 0731 05/19/17 1143 05/20/17 0745  GLUCAP 108* 98 115* 102* 129*   D-Dimer No results for input(s): DDIMER in the last 72 hours. Hgb A1c No results for input(s): HGBA1C in the last 72 hours. Lipid Profile No results for input(s): CHOL, HDL, LDLCALC, TRIG, CHOLHDL, LDLDIRECT in the last 72 hours. Thyroid function studies No results for input(s): TSH, T4TOTAL, T3FREE, THYROIDAB in the last 72 hours.  Invalid input(s): FREET3 Anemia work up  Recent Labs  05/19/17 1842  FERRITIN 239  TIBC 169*  IRON 23*   Urinalysis    Component  Value Date/Time   COLORURINE YELLOW 05/18/2017 0557   APPEARANCEUR CLEAR 05/18/2017 0557   LABSPEC 1.020 05/18/2017 0557   PHURINE 7.0 05/18/2017 0557   GLUCOSEU NEGATIVE 05/18/2017 0557   HGBUR MODERATE (A) 05/18/2017 0557   BILIRUBINUR NEGATIVE 05/18/2017 0557   KETONESUR NEGATIVE 05/18/2017 0557   PROTEINUR 30 (A) 05/18/2017 0557   NITRITE NEGATIVE 05/18/2017 0557   LEUKOCYTESUR MODERATE (A) 05/18/2017 0557   Sepsis Labs Invalid input(s): PROCALCITONIN,  WBC,  LACTICIDVEN Microbiology Recent Results (from the past 240 hour(s))  Urine culture     Status: Abnormal (Preliminary result)   Collection Time: 05/18/17  5:54 AM  Result Value Ref Range Status   Specimen Description URINE, RANDOM  Final   Special Requests NONE  Final   Culture (A)  Final    10,000 COLONIES/mL PSEUDOMONAS AERUGINOSA SUSCEPTIBILITIES TO FOLLOW Performed at [facility], [address] [place], [place] [address]    Report Status PENDING  Incomplete  MRSA PCR Screening     Status: None   Collection Time: 05/18/17  1:39 PM  Result Value Ref Range Status   MRSA by PCR NEGATIVE NEGATIVE Final    Comment:        The GeneXpert MRSA Assay (FDA approved for NASAL specimens only), is one component of a comprehensive MRSA colonization surveillance program. It is not intended to diagnose MRSA infection nor to guide or monitor treatment for MRSA infections.      Time coordinating discharge: 28 minutes  SIGNED:   Maxie Barb, MD  [facility] 05/20/2017, 11:43 AM  If 7PM-7AM, please contact night-coverage [email or web address] Password [email or web address]

## 2017-05-20 NOTE — Care Management Note (Signed)
Case Management Note  Patient Details  Name: Craig Giles MRN: [id number] Date of Birth: 03/30/1931  Subjective/Objective: d/c plan [facility]. Patient declines SNF.Spoke to [facility] nurse tech-Geraldine about d/c plans-HHPT/HHOT-they use [facility] will fax orders to fax#[phone]. CSW notified to fax fl2 prior sending patient to same fax#.                   Action/Plan:d/c ALF w/HHC.   Expected Discharge Date:                  Expected Discharge Plan:  Assisted Living / Rest Home  In-House Referral:  Clinical Social Work  Discharge planning Services  CM Consult  Post Acute Care Choice:    Choice offered to:     DME Arranged:    DME Agency:     HH Arranged:  PT, OT HH Agency:   Medical sales representative([facility] has own PT-[facility])  Status of Service:  Completed, signed off  If discussed at [employer] Length of [employer] Meetings, dates discussed:    Additional Comments:  Lanier ClamMahabir, Rylee Nuzum, RN 05/20/2017, 11:07 AM

## 2017-05-20 NOTE — Progress Notes (Signed)
Report given to Tharon AquasGeraldine Posadas, RN at [facility]. Pt will be transported via PTA.

## 2017-05-20 NOTE — Care Management Note (Signed)
Case Management Note  Patient Details  Name: Danley DankerRaymond Brunner MRN: [id number] Date of Birth: 02/23/1931  Subjective/Objective:  Spoke to spouse about HHC agency choice-chose [facility]-change f/c 1x month, HHPT/HHOT. CSW following fro return back to ALF.                  Action/Plan:d/c ALF w/HHC.   Expected Discharge Date:  05/20/17               Expected Discharge Plan:  Assisted Living / Rest Home  In-House Referral:  Clinical Social Work  Discharge planning Services  CM Consult  Post Acute Care Choice:    Choice offered to:     DME Arranged:    DME Agency:     HH Arranged:  PT, OT, RN HH Agency:     Status of Service:  Completed, signed off  If discussed at Long Length of Stay Meetings, dates discussed:    Additional Comments:  Lanier ClamMahabir, Qadir Folks, RN 05/20/2017, 11:50 AM

## 2017-05-20 NOTE — Progress Notes (Signed)
[facility] Progress Note  Subjective: The patient is doing well today. No report of further bleeding.  Objective: Vital signs in last 24 hours: Temp:  [98.2 F (36.8 C)-98.9 F (37.2 C)] 98.2 F (36.8 C) (06/19 0631) Pulse Rate:  [49-58] 49 (06/19 0631) Resp:  [18] 18 (06/19 0631) BP: (92-137)/(47-69) 137/69 (06/19 0631) SpO2:  [98 %-99 %] 99 % (06/19 0631) Weight change:    PE:  No abdominal discomfort  Lab Results: Results for orders placed or performed during the hospital encounter of 05/18/17 (from the past 24 hour(s))  Glucose, capillary     Status: Abnormal   Collection Time: 05/19/17 11:43 AM  Result Value Ref Range   Glucose-Capillary 102 (H) 65 - 99 mg/dL  Iron and TIBC     Status: Abnormal   Collection Time: 05/19/17  6:42 PM  Result Value Ref Range   Iron 23 (L) 45 - 182 ug/dL   TIBC 161169 (L) 096250 - 045450 ug/dL   Saturation Ratios 14 (L) 17.9 - 39.5 %   UIBC 146 ug/dL  Ferritin     Status: None   Collection Time: 05/19/17  6:42 PM  Result Value Ref Range   Ferritin 239 24 - 336 ng/mL  Hemoglobin and hematocrit, blood     Status: Abnormal   Collection Time: 05/19/17  6:42 PM  Result Value Ref Range   Hemoglobin 10.0 (L) 13.0 - 17.0 g/dL   HCT 40.930.9 (L) 81.139.0 - 91.452.0 %  Glucose, capillary     Status: Abnormal   Collection Time: 05/20/17  7:45 AM  Result Value Ref Range   Glucose-Capillary 129 (H) 65 - 99 mg/dL    Studies/Results: No results found.    Assessment: Rectal bleeding  Plan:   Continue observation. Patient declined colonoscopy.    Gwenevere AbbotSAM F Lynnea Vandervoort 05/20/2017, 8:45 AM  Pager: [phone] If no answer or after 5 PM call [phone] Lab Results  Component Value Date   HGB 10.0 (L) 05/19/2017   HGB 10.3 (L) 05/19/2017   HGB 9.7 (L) 05/19/2017   HCT 30.9 (L) 05/19/2017   HCT 32.2 (L) 05/19/2017   HCT 30.0 (L) 05/19/2017   ALKPHOS 61 04/01/2017   ALKPHOS 68 03/31/2017   AST 23 04/01/2017   AST 33 03/31/2017   ALT 5 (L)  04/01/2017   ALT 6 (L) 03/31/2017

## 2017-05-20 NOTE — Progress Notes (Signed)
Nutrition Brief Note  Patient identified on the Malnutrition Screening Tool (MST) Report  Wt Readings from Last 15 Encounters:  05/18/17 135 lb (61.2 kg)  04/02/17 137 lb (62.1 kg)  12/30/16 144 lb (65.3 kg)  08/12/16 144 lb 4 oz (65.4 kg)  06/13/16 146 lb 8 oz (66.5 kg)  03/11/16 155 lb 8 oz (70.5 kg)  12/21/15 164 lb (74.4 kg)  09/11/15 164 lb (74.4 kg)  03/06/15 169 lb (76.7 kg)  10/03/14 169 lb 6.4 oz (76.8 kg)  04/27/14 169 lb (76.7 kg)  10/22/13 165 lb (74.8 kg)  04/20/13 158 lb (71.7 kg)  11/10/12 150 lb (68 kg)    Body mass index is 22.47 kg/m. Patient meets criteria for normal weight based on current BMI. Pt has lost 9 lbs (6% body weight) in the past 5 months; not significant for time frame. He has  stage 2 sacral and stage 1 L heel pressure injuries. Pt was admitted for 1 day of BRBPR from ALF. He declined colonoscopy during admission and was having diarrhea. Discharge order to ALF and discharge summary in place. Pt is alert and oriented to self only.   Current diet order is 2 gram Na. Pt consumed 50% of lunch yesterday (~135 kcal and 1 gram of protein), 100% of dinner yesterday (~695 kcal and 21 grams of protein), 100% of breakfast this AM (~440 kcal and 9 grams of protein), and 100% of lunch today (~510 kcal and 29 grams of protein).. Labs and medications reviewed.   No nutrition interventions warranted at this time. If nutrition issues arise should pt be unable to d/c today, please consult RD.     Trenton GammonJessica Ravina Milner, MS, RD, LDN, [facility] Inpatient Clinical Dietitian Pager # [phone] After hours/weekend pager # [phone]

## 2017-05-21 LAB — URINE CULTURE: Culture: 10000 — AB

## 2017-05-28 ENCOUNTER — Observation Stay (HOSPITAL_COMMUNITY)
Admission: EM | Admit: 2017-05-28 | Discharge: 2017-05-30 | Disposition: A | Discharge status: Home / Self Care | Payer: Medicare Other | Attending: Family Medicine | Admitting: Family Medicine

## 2017-05-28 ENCOUNTER — Encounter (HOSPITAL_COMMUNITY): Payer: Self-pay

## 2017-05-28 ENCOUNTER — Emergency Department (HOSPITAL_COMMUNITY): Payer: Medicare Other

## 2017-05-28 DIAGNOSIS — R404 Transient alteration of awareness: Secondary | ICD-10-CM

## 2017-05-28 DIAGNOSIS — Z79899 Other long term (current) drug therapy: Secondary | ICD-10-CM | POA: Insufficient documentation

## 2017-05-28 DIAGNOSIS — G9341 Metabolic encephalopathy: Secondary | ICD-10-CM | POA: Diagnosis not present

## 2017-05-28 DIAGNOSIS — F0281 Dementia in other diseases classified elsewhere with behavioral disturbance: Secondary | ICD-10-CM

## 2017-05-28 DIAGNOSIS — R4182 Altered mental status, unspecified: Secondary | ICD-10-CM | POA: Insufficient documentation

## 2017-05-28 DIAGNOSIS — G20 Parkinson's disease: Secondary | ICD-10-CM | POA: Insufficient documentation

## 2017-05-28 DIAGNOSIS — R918 Other nonspecific abnormal finding of lung field: Secondary | ICD-10-CM | POA: Diagnosis not present

## 2017-05-28 DIAGNOSIS — R531 Weakness: Secondary | ICD-10-CM | POA: Diagnosis not present

## 2017-05-28 DIAGNOSIS — E860 Dehydration: Secondary | ICD-10-CM

## 2017-05-28 DIAGNOSIS — E039 Hypothyroidism, unspecified: Secondary | ICD-10-CM | POA: Diagnosis not present

## 2017-05-28 LAB — CBC WITH DIFFERENTIAL/PLATELET
Basophils Absolute: 0 10*3/uL (ref 0.0–0.1)
Basophils Relative: 0 %
EOS ABS: 0.1 10*3/uL (ref 0.0–0.7)
Eosinophils Relative: 1 %
HCT: 38.9 % — ABNORMAL LOW (ref 39.0–52.0)
HEMOGLOBIN: 13.1 g/dL (ref 13.0–17.0)
LYMPHS ABS: 1.2 10*3/uL (ref 0.7–4.0)
Lymphocytes Relative: 9 %
MCH: 29.4 pg (ref 26.0–34.0)
MCHC: 33.7 g/dL (ref 30.0–36.0)
MCV: 87.2 fL (ref 78.0–100.0)
MONO ABS: 0.9 10*3/uL (ref 0.1–1.0)
MONOS PCT: 6 %
NEUTROS PCT: 84 %
Neutro Abs: 11.1 10*3/uL — ABNORMAL HIGH (ref 1.7–7.7)
Platelets: 282 10*3/uL (ref 150–400)
RBC: 4.46 MIL/uL (ref 4.22–5.81)
RDW: 14.9 % (ref 11.5–15.5)
WBC: 13.2 10*3/uL — ABNORMAL HIGH (ref 4.0–10.5)

## 2017-05-28 LAB — COMPREHENSIVE METABOLIC PANEL
ALBUMIN: 3.7 g/dL (ref 3.5–5.0)
ALT: 11 U/L — AB (ref 17–63)
AST: 68 U/L — AB (ref 15–41)
Alkaline Phosphatase: 149 U/L — ABNORMAL HIGH (ref 38–126)
Anion gap: 7 (ref 5–15)
BUN: 19 mg/dL (ref 6–20)
CHLORIDE: 101 mmol/L (ref 101–111)
CO2: 28 mmol/L (ref 22–32)
CREATININE: 0.98 mg/dL (ref 0.61–1.24)
Calcium: 9.6 mg/dL (ref 8.9–10.3)
GFR calc non Af Amer: >60 mL/min (ref >60)
GLUCOSE: 100 mg/dL — AB (ref 65–99)
Potassium: 4.2 mmol/L (ref 3.5–5.1)
SODIUM: 136 mmol/L (ref 135–145)
Total Bilirubin: 1.4 mg/dL — ABNORMAL HIGH (ref 0.3–1.2)
Total Protein: 7.5 g/dL (ref 6.5–8.1)

## 2017-05-28 LAB — URINALYSIS, ROUTINE W REFLEX MICROSCOPIC
Bilirubin Urine: NEGATIVE
GLUCOSE, UA: NEGATIVE mg/dL
Ketones, ur: 5 mg/dL — AB
Nitrite: NEGATIVE
PROTEIN: NEGATIVE mg/dL
Specific Gravity, Urine: 1.019 (ref 1.005–1.030)
Squamous Epithelial / LPF: NONE SEEN
pH: 6 (ref 5.0–8.0)

## 2017-05-28 LAB — I-STAT CG4 LACTIC ACID, ED: LACTIC ACID, VENOUS: 1.19 mmol/L (ref 0.5–1.9)

## 2017-05-28 LAB — I-STAT TROPONIN, ED: TROPONIN I, POC: 0.01 ng/mL (ref 0.00–0.08)

## 2017-05-28 LAB — TSH: TSH: 7.265 u[IU]/mL — AB (ref 0.350–4.500)

## 2017-05-28 LAB — GLUCOSE, CAPILLARY: GLUCOSE-CAPILLARY: 140 mg/dL — AB (ref 65–99)

## 2017-05-28 MED ORDER — LEVOTHYROXINE SODIUM 88 MCG PO TABS
88.0000 ug | ORAL_TABLET | Freq: Every day | ORAL | Status: DC
  Administered 2017-05-29 – 2017-05-30 (×2): 88 ug via ORAL
  Filled 2017-05-28 (×2): qty 1

## 2017-05-28 MED ORDER — DEXTROSE-NACL 5-0.45 % IV SOLN
INTRAVENOUS | Status: DC
  Administered 2017-05-28: 20:00:00 via INTRAVENOUS

## 2017-05-28 MED ORDER — SODIUM CHLORIDE 0.9 % IV SOLN
INTRAVENOUS | Status: DC
  Administered 2017-05-28: 17:00:00 via INTRAVENOUS

## 2017-05-28 MED ORDER — VITAMIN C 500 MG PO TABS
250.0000 mg | ORAL_TABLET | Freq: Every day | ORAL | Status: DC
  Administered 2017-05-29 – 2017-05-30 (×2): 250 mg via ORAL
  Filled 2017-05-28 (×2): qty 1

## 2017-05-28 MED ORDER — SODIUM CHLORIDE 0.9 % IV BOLUS (SEPSIS)
500.0000 mL | Freq: Once | INTRAVENOUS | Status: AC
  Administered 2017-05-28: 500 mL via INTRAVENOUS

## 2017-05-28 MED ORDER — ACETAMINOPHEN 325 MG PO TABS: 650.0000 mg | ORAL_TABLET | Freq: Four times a day (QID) | ORAL | Status: DC | PRN

## 2017-05-28 MED ORDER — TAMSULOSIN HCL 0.4 MG PO CAPS
0.4000 mg | ORAL_CAPSULE | Freq: Every day | ORAL | Status: DC
  Administered 2017-05-29 – 2017-05-30 (×2): 0.4 mg via ORAL
  Filled 2017-05-28 (×2): qty 1

## 2017-05-28 MED ORDER — SACCHAROMYCES BOULARDII 250 MG PO CAPS
250.0000 mg | ORAL_CAPSULE | Freq: Every day | ORAL | Status: DC
  Administered 2017-05-29 – 2017-05-30 (×2): 250 mg via ORAL
  Filled 2017-05-28 (×2): qty 1

## 2017-05-28 MED ORDER — ROPINIROLE HCL 1 MG PO TABS
5.0000 mg | ORAL_TABLET | Freq: Every day | ORAL | Status: DC
  Administered 2017-05-29 – 2017-05-30 (×2): 5 mg via ORAL
  Filled 2017-05-28 (×2): qty 5

## 2017-05-28 MED ORDER — ACETAMINOPHEN 650 MG RE SUPP: 650.0000 mg | Freq: Four times a day (QID) | RECTAL | Status: DC | PRN

## 2017-05-28 MED ORDER — CARBIDOPA-LEVODOPA 25-100 MG PO TABS
1.0000 | ORAL_TABLET | Freq: Three times a day (TID) | ORAL | Status: DC
  Administered 2017-05-29 – 2017-05-30 (×4): 1 via ORAL
  Filled 2017-05-28 (×4): qty 1

## 2017-05-28 MED ORDER — ADULT MULTIVITAMIN W/MINERALS CH
1.0000 | ORAL_TABLET | Freq: Every day | ORAL | Status: DC
  Administered 2017-05-29 – 2017-05-30 (×2): 1 via ORAL
  Filled 2017-05-28 (×2): qty 1

## 2017-05-28 MED ORDER — CIPROFLOXACIN IN D5W 400 MG/200ML IV SOLN
400.0000 mg | Freq: Two times a day (BID) | INTRAVENOUS | Status: DC
  Administered 2017-05-28 – 2017-05-29 (×2): 400 mg via INTRAVENOUS
  Filled 2017-05-28 (×2): qty 200

## 2017-05-28 MED ORDER — ZINC SULFATE 220 (50 ZN) MG PO CAPS
220.0000 mg | ORAL_CAPSULE | Freq: Every day | ORAL | Status: DC
  Administered 2017-05-29 – 2017-05-30 (×2): 220 mg via ORAL
  Filled 2017-05-28 (×2): qty 1

## 2017-05-28 MED ORDER — POLYETHYLENE GLYCOL 3350 17 G PO PACK
17.0000 g | PACK | Freq: Every day | ORAL | Status: DC
  Administered 2017-05-29 – 2017-05-30 (×2): 17 g via ORAL
  Filled 2017-05-28 (×2): qty 1

## 2017-05-28 NOTE — H&P (Signed)
History and Physical    Craig Giles ZOX:[id number] DOB: 1931-07-31 DOA: 05/28/2017  PCP: Willow Ora, MD Patient coming from: Craig Giles  Chief Complaint: Decline in mental status  HPI: Craig Giles is a 83 y.o. male with medical history significant of hypothyroidism, Parkinson's, memory deficit was brought to the ER for change in mental status. Overall patient's health is declining over several months but at the assisted living he was noted to be more drowsy yesterday. During this time his appetite has been poor and not been taking his medication. Due to declining condition he was sent here for further evaluation. Wife states patient has an indwelling Foley catheter for several months now and thinks last it was changed over 2 weeks ago. She does not know if he had any fevers, chills and other symptoms as patient communicates very minimally. Patient was here about 10 days ago for episode of rectal bleeding and likely urinary tract infection. He was evaluated by GI and then eventually discharged on 5 days of Levaquin. At that time he had denied skilled nursing facility and wanted to go to assisted living facility.  Today in the ER he appears to be very weak and his labs for the most part looks unremarkable except relative leukocytosis. It appears his baseline WBC is around 7 and today it is 13.2. Has a Foley in which was changed in the ER. Chest x-ray is negative. UA shows questionable urinary tract infection but looks much better from previous UAs. Clinically he appears slightly dehydrated as well.  No other complaints during my evaluation. Review of Systems: As per HPI otherwise 10 point review of systems negative.   Past Medical History:  Diagnosis Date  . Hypothyroidism   . Memory deficits 10/22/2013  . Parkinson disease (HCC)   . 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 TONSILLECTOMY       reports that he has  never smoked. He has never used smokeless tobacco. He reports that he does not drink alcohol or use drugs.  No Known Allergies  Family History  Problem Relation Age of Onset  . Diabetes Brother     Acceptable: Family history reviewed and not pertinent (If you reviewed it)  Prior to Admission medications   Medication Sig Start Date End Date Taking? Authorizing Provider  acetaminophen (TYLENOL) 325 MG tablet Take 650 mg by mouth every 6 (six) hours as needed for mild pain, moderate pain, fever or headache.     [provider]  carbidopa-levodopa (SINEMET IR) 25-100 MG tablet Take 1 tablet by mouth 3 (three) times daily. 12/30/16   York Spaniel, MD  levothyroxine (SYNTHROID, LEVOTHROID) 88 MCG tablet Take 88 mcg by mouth daily before breakfast.     [provider]  Multiple Vitamin (MULTIVITAMIN WITH MINERALS) TABS tablet Take 1 tablet by mouth daily.    [provider]  polyethylene glycol (MIRALAX / GLYCOLAX) packet Take 17 g by mouth daily.     [provider]  ropinirole (REQUIP) 5 MG tablet Take 5 mg by mouth daily.    [provider]  saccharomyces boulardii (FLORASTOR) 250 MG capsule Take 250 mg by mouth daily.    [provider]  tamsulosin (FLOMAX) 0.4 MG CAPS capsule Take 1 capsule (0.4 mg total) by mouth daily. 04/04/17   [name], MD  traZODone (DESYREL) 100 MG tablet Take 1 tablet (100 mg total) by mouth at bedtime. 12/30/16   Anne Hahn,  Hennie Duosharles K, MD  vitamin C (ASCORBIC ACID) 250 MG tablet Take 250 mg by mouth daily.    [provider]  zinc sulfate 220 (50 Zn) MG capsule Take 220 mg by mouth daily.    [provider]    Physical Exam: Vitals:   05/28/17 1412 05/28/17 1426 05/28/17 1500  BP: 102/62  (!) 101/51  Pulse: 72  72  Resp: 18  18  Temp: 99.5 F (37.5 C) 99.2 F (37.3 C)   TempSrc: Oral Rectal   SpO2: 100%  93%      Constitutional: NAD, calm, comfortable; cachectic  frail-appearing Vitals:   05/28/17 1412 05/28/17 1426 05/28/17 1500  BP: 102/62  (!) 101/51  Pulse: 72  72  Resp: 18  18  Temp: 99.5 F (37.5 C) 99.2 F (37.3 C)   TempSrc: Oral Rectal   SpO2: 100%  93%   Eyes: PERRL, lids and conjunctivae normal ENMT: Mucous membranes are Dry. Posterior pharynx clear of any exudate or lesions.poor dentition.  Neck: normal, supple, no masses, no thyromegaly Respiratory: clear to auscultation bilaterally, no wheezing, no crackles. Normal respiratory effort. No accessory muscle use.  Cardiovascular: Regular rate and rhythm, no murmurs / rubs / gallops. No extremity edema. 2+ pedal pulses. No carotid bruits.  Abdomen: no tenderness, no masses palpated. No hepatosplenomegaly. Bowel sounds positive.  Musculoskeletal: no clubbing / cyanosis. No joint deformity upper and lower extremities. Good ROM, no contractures. Normal muscle tone.  Skin: no rashes, lesions, ulcers. No induration Neurologic: CN 2-12 grossly intact. Sensation intact, DTR normal. Strength 5/5 in all 4.  Psychiatric: Normal judgment and insight. Alert and oriented x 1. Normal mood.  Foley in place   Labs on Admission: I have personally reviewed following labs and imaging studies  CBC:  Recent Labs Lab 05/28/17 1439  WBC 13.2*  NEUTROABS 11.1*  HGB 13.1  HCT 38.9*  MCV 87.2  PLT 282   Basic Metabolic Panel:  Recent Labs Lab 05/28/17 1439  NA 136  K 4.2  CL 101  CO2 28  GLUCOSE 100*  BUN 19  CREATININE 0.98  CALCIUM 9.6   GFR: Estimated Creatinine Clearance: 48.6 mL/min (by C-G formula based on SCr of 0.98 mg/dL). Liver Function Tests:  Recent Labs Lab 05/28/17 1439  AST 68*  ALT 11*  ALKPHOS 149*  BILITOT 1.4*  PROT 7.5  ALBUMIN 3.7   No results for input(s): LIPASE, AMYLASE in the last 168 hours. No results for input(s): AMMONIA in the last 168 hours. Coagulation Profile: No results for input(s): INR, PROTIME in the last 168 hours. Cardiac Enzymes: No  results for input(s): CKTOTAL, CKMB, CKMBINDEX, TROPONINI in the last 168 hours. BNP (last 3 results) No results for input(s): PROBNP in the last 8760 hours. HbA1C: No results for input(s): HGBA1C in the last 72 hours. CBG: No results for input(s): GLUCAP in the last 168 hours. Lipid Profile: No results for input(s): CHOL, HDL, LDLCALC, TRIG, CHOLHDL, LDLDIRECT in the last 72 hours. Thyroid Function Tests: No results for input(s): TSH, T4TOTAL, FREET4, T3FREE, THYROIDAB in the last 72 hours. Anemia Panel: No results for input(s): VITAMINB12, FOLATE, FERRITIN, TIBC, IRON, RETICCTPCT in the last 72 hours. Urine analysis:    Component Value Date/Time   COLORURINE YELLOW 05/28/2017 1418   APPEARANCEUR HAZY (A) 05/28/2017 1418   LABSPEC 1.019 05/28/2017 1418   PHURINE 6.0 05/28/2017 1418   GLUCOSEU NEGATIVE 05/28/2017 1418   HGBUR LARGE (A) 05/28/2017 1418   BILIRUBINUR NEGATIVE 05/28/2017 1418  KETONESUR 5 (A) 05/28/2017 1418   PROTEINUR NEGATIVE 05/28/2017 1418   NITRITE NEGATIVE 05/28/2017 1418   LEUKOCYTESUR TRACE (A) 05/28/2017 1418   Sepsis Labs: !!!!!!!!!!!!!!!!!!!!!!!!!!!!!!!!!!!!!!!!!!!! @LABRCNTIP (procalcitonin:4,lacticidven:4) )No results found for this or any previous visit (from the past 240 hour(s)).   Radiological Exams on Admission: Dg Chest Port 1 View  Result Date: 05/28/2017 CLINICAL DATA:  Increase lethargy today. History of Parkinson's disease. EXAM: PORTABLE CHEST 1 VIEW COMPARISON:  Chest x-ray of Apr 01, 2017 FINDINGS: The right lung is well-expanded and clear. The mid and lower left lung are clear. The mid upper lumbar obscured by the patient's chin. The cardiac silhouette is enlarged. The pulmonary vascularity is not engorged. There is tortuosity of the descending thoracic aorta. IMPRESSION: No acute cardiopulmonary abnormality is observed. Thoracic aortic atherosclerosis. Electronically Signed   By: David  [place] M.D.   On: 05/28/2017 15:00    EKG:  Independently reviewed.   Assessment/Plan Active Problems:   Generalized weakness    Generalized weakness -Generalized deconditioning with possible underlying urinary tract infection? -Bedrest, fall precaution -Check TSH, PT OT in the morning -Chest x-ray is clear, blood cultures drawn in the ER  Possible urinary tract infection -UA shows trace leukocytes, normal WBC. Serum WBC shows relative leukocytosis when compared to previous WBC -Given relative leukocytosis, chronic indwelling Foley and altered mental status without any other source of infection, will treat with IV Cipro. Previous urine cultures reviewed -Neurochecks, no CT head necessary at this time  Mild dehydration -IV fluids with dextrose. Oral diet when more awake -Accu-Cheks every 6 hours as he is not eating  Moderate protein calorie malnutrition -Encourage oral diet  Chronic urinary retention -Foley changed in the ER. Reassess for Foley removal prior to discharge  History of Parkinson's -Continue home meds when able  History of dementia -Cognitive therapy outpatient.   Overall long-term poor prognosis given his comorbidities  DVT prophylaxis: SCDs Code Status: Full code Family Communication: Wife at bedside Disposition Plan: To be determined Consults called: None Admission status: Observation   Ankit Joline Maxcy MD [facility]   If 7PM-7AM, please contact night-coverage [email or web address] Password [facility]  05/28/2017, 4:17 PM

## 2017-05-28 NOTE — ED Provider Notes (Signed)
[facility]-EMERGENCY DEPT Provider Note   CSN: [id number] Arrival date & time: 05/28/17  1352     History   Chief Complaint Chief Complaint  Patient presents with  . Weakness    HPI Craig Giles is a 83 y.o. male.  Patient with history of Parkinson's disease, urinary tract infection -- presents with decreased energy, increased sleeping, decreased appetite, chills since last night. Wife is concerned that he has is developing another infection. Patient was admitted to the hospital on 05/18/17 with rectal bleeding and Pseudomonas UTI treated with Levaquin. Patient was reportedly improved until his change yesterday. No documented fevers, URI symptoms, reported chest pain, shortness of breath, cough, vomiting, diarrhea. No skin rashes. Wife believes that the urine in his Foley catheter appears darker today. The onset of this condition was acute. The course is constant. Aggravating factors: none. Alleviating factors: none.        Past Medical History:  Diagnosis Date  . Hypothyroidism   . Memory deficits 10/22/2013  . Parkinson disease (HCC)   . 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 Indwelling Foley catheter present   . Rectal bleeding   . GI bleed 05/18/2017  . Pressure injury of skin 04/01/2017  . PNA (pneumonia) 03/31/2017  . Chronic insomnia 08/12/2016  . 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 Memory deficits 10/22/2013  . Abnormality of gait 11/10/2012  . Paralysis agitans (HCC) 11/10/2012    Past Surgical History:  Procedure Laterality Date  . TONSILLECTOMY         Home Medications    Prior to Admission medications   Medication Sig Start Date End Date Taking? Authorizing Provider  acetaminophen (TYLENOL) 325 MG tablet Take 650 mg by mouth every 6 (six) hours as needed for mild pain, moderate pain, fever or headache.     [provider]  carbidopa-levodopa (SINEMET IR) 25-100 MG tablet Take  1 tablet by mouth 3 (three) times daily. 12/30/16   York SpanielWillis, Charles K, MD  levothyroxine (SYNTHROID, LEVOTHROID) 88 MCG tablet Take 88 mcg by mouth daily before breakfast.     [provider]  Multiple Vitamin (MULTIVITAMIN WITH MINERALS) TABS tablet Take 1 tablet by mouth daily.    [provider]  polyethylene glycol (MIRALAX / GLYCOLAX) packet Take 17 g by mouth daily.     [provider]  ropinirole (REQUIP) 5 MG tablet Take 5 mg by mouth daily.    [provider]  saccharomyces boulardii (FLORASTOR) 250 MG capsule Take 250 mg by mouth daily.    [provider]  tamsulosin (FLOMAX) 0.4 MG CAPS capsule Take 1 capsule (0.4 mg total) by mouth daily. 04/04/17   Arnetha CourserAmin, Sumayya, MD  traZODone (DESYREL) 100 MG tablet Take 1 tablet (100 mg total) by mouth at bedtime. 12/30/16   York SpanielWillis, Charles K, MD  vitamin C (ASCORBIC ACID) 250 MG tablet Take 250 mg by mouth daily.    [provider]  zinc sulfate 220 (50 Zn) MG capsule Take 220 mg by mouth daily.    [provider]    Family History Family History  Problem Relation Age of Onset  . Diabetes Brother     Social History Social History  Substance Use Topics  . Smoking status: Never Smoker  . Smokeless tobacco: Never Used  . Alcohol use No     Allergies   Patient has no known allergies.   Review of Systems Review of Systems  Constitutional: Positive for chills. Negative for fever.  HENT: Negative for rhinorrhea and sore throat.   Eyes: Negative for redness.  Respiratory: Negative for cough and shortness of breath.   Cardiovascular: Negative for chest pain.  Gastrointestinal: Negative for abdominal pain, diarrhea, nausea and vomiting.  Genitourinary: Negative for dysuria.  Musculoskeletal: Negative for myalgias.  Skin: Negative for rash.  Neurological: Positive for weakness. Negative for headaches.     Physical Exam Updated Vital Signs BP 102/62 (BP Location: Right Arm)    Pulse 72   Temp 99.5 F (37.5 C) (Oral)   Resp 18   SpO2 100%   Physical Exam  Constitutional: He appears well-developed and well-nourished.  HENT:  Head: Normocephalic and atraumatic.  Right Ear: External ear normal.  Left Ear: External ear normal.  Eyes: Conjunctivae are normal. Right eye exhibits no discharge. Left eye exhibits no discharge.  Neck: Normal range of motion. Neck supple.  Cardiovascular: Normal rate and regular rhythm.   Murmur (3/6, systolic murmur left sternal border) heard. Pulmonary/Chest: Effort normal and breath sounds normal. No respiratory distress. He has no wheezes. He has no rales.  Abdominal: Soft. There is no tenderness. There is no rebound and no guarding.  Neurological: He is alert.  Patient is responsive to voice. Seems very sleepy.  Skin: Skin is warm and dry.  Psychiatric: He has a normal mood and affect.  Nursing note and vitals reviewed.    ED Treatments / Results  Labs (all labs ordered are listed, but only abnormal results are displayed) Labs Reviewed  COMPREHENSIVE METABOLIC PANEL - Abnormal; Notable for the following:       Result Value   Glucose, Bld 100 (*)    AST 68 (*)    ALT 11 (*)    Alkaline Phosphatase 149 (*)    Total Bilirubin 1.4 (*)    All other components within normal limits  CBC WITH DIFFERENTIAL/PLATELET - Abnormal; Notable for the following:    WBC 13.2 (*)    HCT 38.9 (*)    Neutro Abs 11.1 (*)    All other components within normal limits  URINALYSIS, ROUTINE W REFLEX MICROSCOPIC - Abnormal; Notable for the following:    APPearance HAZY (*)    Hgb urine dipstick LARGE (*)    Ketones, ur 5 (*)    Leukocytes, UA TRACE (*)    Bacteria, UA RARE (*)    All other components within normal limits  CULTURE, BLOOD (ROUTINE X 2)  CULTURE, BLOOD (ROUTINE X 2)  URINE CULTURE  I-STAT CG4 LACTIC ACID, ED  Rosezena Sensor, ED    ED ECG REPORT   Date: 05/28/2017  Rate: 72  Rhythm: normal sinus rhythm  QRS  Axis: normal  Intervals: normal  ST/T Wave abnormalities: nonspecific ST changes  Conduction Disutrbances:none  Narrative Interpretation:   Old EKG Reviewed: unchanged  I have personally reviewed the EKG tracing and agree with the computerized printout as noted.  Radiology Dg Chest Port 1 View  Result Date: 05/28/2017 CLINICAL DATA:  Increase lethargy today. History of Parkinson's disease. EXAM: PORTABLE CHEST 1 VIEW COMPARISON:  Chest x-ray of Apr 01, 2017 FINDINGS: The right lung is well-expanded and clear. The mid and lower left lung are clear. The mid upper lumbar obscured by the patient's chin. The cardiac silhouette is enlarged. The pulmonary vascularity is not engorged. There is tortuosity of the descending thoracic aorta. IMPRESSION: No acute cardiopulmonary abnormality is observed. Thoracic aortic atherosclerosis. Electronically Signed   By: Onalee Hua  [place] M.D.   On: 05/28/2017 15:00    Procedures Procedures (including critical care time)  Medications Ordered in ED Medications - No data to display   Initial Impression / Assessment and Plan / ED Course  I have reviewed the triage vital signs and the nursing notes.  Pertinent labs & imaging results that were available during my care of the patient were reviewed by me and considered in my medical decision making (see chart for details).     Patient seen and examined. Work-up initiated.   Vital signs reviewed and are as follows: BP 102/62 (BP Location: Right Arm)   Pulse 72   Temp 99.5 F (37.5 C) (Oral)   Resp 18   SpO2 100%   Patient discussed with and seen by Dr. Clayborne Dana.   UA and CXR clear. Will hydrate, admit for generalized weakness/dehydration.   Spoke with hospitalist who will see patient.   Final Clinical Impressions(s) / ED Diagnoses   Final diagnoses:  Weakness  Altered level of consciousness   Admit for weakness, aloc, elevated WBC.   New Prescriptions New Prescriptions   No medications on file      Renne Crigler, Cordelia Poche 05/28/17 1601    Mesner, Barbara Cower, MD 05/29/17 [phone]

## 2017-05-28 NOTE — ED Triage Notes (Signed)
[facility]. To be "lethargic" today; more than his norm. He arrives here drowsy and arouseable, and in no distress. His wife is here with him.

## 2017-05-29 ENCOUNTER — Encounter (HOSPITAL_COMMUNITY): Payer: Self-pay | Admitting: *Deleted

## 2017-05-29 DIAGNOSIS — N390 Urinary tract infection, site not specified: Secondary | ICD-10-CM

## 2017-05-29 DIAGNOSIS — R531 Weakness: Secondary | ICD-10-CM | POA: Diagnosis not present

## 2017-05-29 DIAGNOSIS — E860 Dehydration: Secondary | ICD-10-CM | POA: Diagnosis not present

## 2017-05-29 DIAGNOSIS — Z9289 Personal history of other medical treatment: Secondary | ICD-10-CM

## 2017-05-29 DIAGNOSIS — R404 Transient alteration of awareness: Secondary | ICD-10-CM | POA: Diagnosis not present

## 2017-05-29 LAB — URINE CULTURE: CULTURE: NO GROWTH

## 2017-05-29 LAB — TSH: TSH: 9.382 u[IU]/mL — ABNORMAL HIGH (ref 0.350–4.500)

## 2017-05-29 LAB — GLUCOSE, CAPILLARY
GLUCOSE-CAPILLARY: 108 mg/dL — AB (ref 65–99)
GLUCOSE-CAPILLARY: 106 mg/dL — AB (ref 65–99)

## 2017-05-29 LAB — COMPREHENSIVE METABOLIC PANEL
ALT: 29 U/L (ref 17–63)
ANION GAP: 5 (ref 5–15)
AST: 53 U/L — ABNORMAL HIGH (ref 15–41)
Albumin: 2.7 g/dL — ABNORMAL LOW (ref 3.5–5.0)
Alkaline Phosphatase: 111 U/L (ref 38–126)
BUN: 14 mg/dL (ref 6–20)
CHLORIDE: 102 mmol/L (ref 101–111)
CO2: 27 mmol/L (ref 22–32)
Calcium: 8.7 mg/dL — ABNORMAL LOW (ref 8.9–10.3)
Creatinine, Ser: 0.82 mg/dL (ref 0.61–1.24)
GFR calc non Af Amer: >60 mL/min (ref >60)
Glucose, Bld: 107 mg/dL — ABNORMAL HIGH (ref 65–99)
POTASSIUM: 3.7 mmol/L (ref 3.5–5.1)
SODIUM: 134 mmol/L — AB (ref 135–145)
Total Bilirubin: 1.4 mg/dL — ABNORMAL HIGH (ref 0.3–1.2)
Total Protein: 5.6 g/dL — ABNORMAL LOW (ref 6.5–8.1)

## 2017-05-29 LAB — T4, FREE: Free T4: 0.88 ng/dL (ref 0.61–1.12)

## 2017-05-29 MED ORDER — CIPROFLOXACIN HCL 500 MG PO TABS
500.0000 mg | ORAL_TABLET | Freq: Two times a day (BID) | ORAL | Status: DC
  Administered 2017-05-29 – 2017-05-30 (×2): 500 mg via ORAL
  Filled 2017-05-29 (×2): qty 1

## 2017-05-29 MED ORDER — CIPROFLOXACIN IN D5W 200 MG/100ML IV SOLN
200.0000 mg | Freq: Two times a day (BID) | INTRAVENOUS | Status: DC
  Filled 2017-05-29: qty 100

## 2017-05-29 MED ORDER — TRAZODONE HCL 50 MG PO TABS
100.0000 mg | ORAL_TABLET | Freq: Every day | ORAL | Status: DC
  Administered 2017-05-29: 100 mg via ORAL
  Filled 2017-05-29: qty 2

## 2017-05-29 NOTE — Progress Notes (Signed)
Discussed DC plan with pt's wife and son. PT recommendation for SNF discussed- wife states she would prefer for pt [facility]. CSW spoke with ALF staff and they stated that ALF cannot currently meet pt's need for 2 person assist and would advise family to seek SNF for ST rehab then plan on returning to ALF once closer to his baseline mobility (1 person assist and RW). Wife agreed to referral to [facility] (pt received rehab there April 2018). Discussed that if no beds are available there wife will need to consider other facilities.   Completed FL2 and made referral. Will follow. Pt will need [employer] Medicare preauthorization for SNF and CSW will initiate once facility decided on.   Ilean SkillMeghan Myrna Vonseggern, MSW, LCSW Clinical Social Work 05/29/2017 [phone]

## 2017-05-29 NOTE — Progress Notes (Signed)
Initial Nutrition Assessment  INTERVENTION:   Will add chopped meats to diet order. Encouraged PO intake  NUTRITION DIAGNOSIS:   Malnutrition (Moderate) related to acute illness, lethargy/confusion as evidenced by moderate depletion of body fat, moderate depletions of muscle mass.  GOAL:   Patient will meet greater than or equal to 90% of their needs  MONITOR:   PO intake, Labs, Weight trends, Skin, I & O's  REASON FOR ASSESSMENT:   Malnutrition Screening Tool    ASSESSMENT:   83 y.o. male with medical history significant of hypothyroidism, Parkinson's, memory deficit was brought to the ER for change in mental status. Overall patient's health is declining over several months but at the assisted living he was noted to be more drowsy yesterday. During this time his appetite has been poor and not been taking his medication. Due to declining condition he was sent here for further evaluation.  Patient in room with no family at bedside. Pt very lethargic acting when RD visited. Pt reported what he ate for breakfast and fell asleep. RN reports pt was very talkative and laughing this morning when family was at bedside. Pt just finished working with PT and had a bath so he may be tired. Per RN, pt ate 2 trays of breakfast and ate 100%. Noted no issues of chewing or swallowing difficulty. Per chart, pt prefers chopped meats. Will add to order.  Per chart, pt's appetite was poor for 2 weeks. Improved now.  Per chart review, pt has lost 9 lb since January 2018 (6% wt loss x 5 months, insignificant for time frame). Nutrition-Focused physical exam completed. Findings are moderate fat depletion, moderate muscle depletion, and no edema. Pt's depletions are most likely related to parkinson's disease.    Medications: Multivitamin with minerals daily, Miralax packet daily, Florastor capsule daily, Vitamin C tablet daily, Zinc sulfate capsule daily Labs reviewed: Low Na  Diet Order:  Diet regular  Room service appropriate? Yes; Fluid consistency: Thin  Skin:  Wound (see comment) (Stage II sacral pressure injury, Stage I heel pressure injury)  Last BM:  6/27  Height:   Ht Readings from Last 1 Encounters:  05/18/17 5\' 5"  (1.651 m)    Weight:   Wt Readings from Last 1 Encounters:  05/18/17 135 lb (61.2 kg)    Ideal Body Weight:  56.8 kg  BMI:  There is no height or weight on file to calculate BMI.  Estimated Nutritional Needs:   Kcal:  1550-1750  Protein:  70-80g  Fluid:  1.7L/day  EDUCATION NEEDS:   No education needs identified at this time  Tilda FrancoLindsey Dulce Martian, MS, RD, LDN Pager: [phone] After Hours Pager: [phone]

## 2017-05-29 NOTE — Evaluation (Signed)
Physical Therapy Evaluation Patient Details Name: Januel Doolan MRN: [id number] DOB: Oct 27, 1931 Today's Date: 05/29/2017   History of Present Illness  83 yo male admitted with generalized weakness. Hx of Parkinson's disease, memory deficits, hypothyroidism  Clinical Impression  On eval, pt required Total assist +2 for bed mobility. Attempted standing x 2 but pt was unable. Poor initiation of tasks and command following noted. Family initially present but left at start of PT eval. Will follow and progress activity as able. Recommend SNF.     Follow Up Recommendations SNF    Equipment Recommendations  None recommended by PT    Recommendations for Other Services       Precautions / Restrictions Precautions Precautions: Fall Restrictions Weight Bearing Restrictions: No      Mobility  Bed Mobility Overal bed mobility: Needs Assistance       Supine to sit: Total assist;+2 for physical assistance;+2 for safety/equipment;HOB elevated   Sit to supine: Total assist;+2 for physical assistance;+2 for safety/equipment  General bed mobility comments: Increased time and repeated cueing for all tasks. Pt did not initiate any tasks. Assist for trunk, and bil LEs. Utilized bedpad for scooting, positioning.   Transfers Overall transfer level: Needs assistance Equipment used: Rolling walker (2 wheeled) Transfers: Sit to/from Stand Sit to Stand: Total assist;+2 physical assistance;+2 safety/equipment;From elevated surface         General transfer comment: Attempted sit to stand x 2-pt unable. Moderate posterior lean and poor anterior weight shifting ability. Assist to position and block both feet.   Ambulation/Gait             General Gait Details: NT-pt unable at this time  Stairs            Wheelchair Mobility    Modified Rankin (Stroke Patients Only)       Balance Overall balance assessment: Needs assistance   Sitting balance-Leahy Scale: Poor Sitting  balance - Comments: Sat EOB ~1-2 mintues with moderate posterior lean.  Postural control: Posterior lean   Standing balance-Leahy Scale: Zero                               Pertinent Vitals/Pain Pain Assessment: No/denies pain    Home Living Family/patient expects to be discharged to:: Unsure                      Prior Function Level of Independence: Needs assistance         Comments: unsure of PLOF. No family present to provide history. Pt appears confused     Hand Dominance        Extremity/Trunk Assessment   Upper Extremity Assessment Upper Extremity Assessment: Difficult to assess due to impaired cognition    Lower Extremity Assessment Lower Extremity Assessment: Difficult to assess due to impaired cognition    Cervical / Trunk Assessment Cervical / Trunk Assessment: Kyphotic  Communication   Communication: No difficulties  Cognition Arousal/Alertness: Awake/alert Behavior During Therapy: Flat affect Overall Cognitive Status: No family/caregiver present to determine baseline cognitive functioning                                        General Comments      Exercises     Assessment/Plan    PT Assessment Patient needs continued PT services  PT Problem List Decreased strength;Decreased  mobility;Decreased range of motion;Decreased activity tolerance;Decreased balance;Decreased knowledge of use of DME;Pain;Decreased cognition;Decreased coordination       PT Treatment Interventions DME instruction;Therapeutic activities;Gait training;Therapeutic exercise;Functional mobility training;Balance training;Patient/family education    PT Goals (Current goals can be found in the Care Plan section)  Acute Rehab PT Goals Patient Stated Goal: none stated PT Goal Formulation: With patient Time For Goal Achievement: 06/12/17 Potential to Achieve Goals: Fair    Frequency Min 2X/week   Barriers to discharge         Co-evaluation               AM-PAC PT "6 Clicks" Daily Activity  Outcome Measure Difficulty turning over in bed (including adjusting bedclothes, sheets and blankets)?: Total Difficulty moving from lying on back to sitting on the side of the bed? : Total Difficulty sitting down on and standing up from a chair with arms (e.g., wheelchair, bedside commode, etc,.)?: Total Help needed moving to and from a bed to chair (including a wheelchair)?: Total Help needed walking in hospital room?: Total Help needed climbing 3-5 steps with a railing? : Total 6 Click Score: 6    End of Session Equipment Utilized During Treatment: Gait belt Activity Tolerance: Patient tolerated treatment well Patient left: in bed;with call bell/phone within reach;with bed alarm set   PT Visit Diagnosis: Muscle weakness (generalized) (M62.81);Difficulty in walking, not elsewhere classified (R26.2)    Time: [id number] PT Time Calculation (min) (ACUTE ONLY): 8 min   Charges:   PT Evaluation $PT Eval Moderate Complexity: 1 Procedure     PT G Codes:   PT G-Codes **NOT FOR INPATIENT CLASS** Functional Assessment Tool Used: AM-PAC 6 Clicks Basic Mobility;Clinical judgement Functional Limitation: Mobility: Walking and moving around Mobility: Walking and Moving Around Current Status (V4098(G8978): 100 percent impaired, limited or restricted Mobility: Walking and Moving Around Goal Status (J1914(G8979): At least 60 percent but less than 80 percent impaired, limited or restricted      Rebeca AlertJannie Harumi Yamin, MPT Pager: [phone]

## 2017-05-29 NOTE — Clinical Social Work Note (Signed)
Clinical Social Work Assessment  Patient Details  Name: Craig Giles MRN: [id number] Date of Birth: 03/16/1931  Date of referral:  05/29/17               Reason for consult:  Discharge Planning                Permission sought to share information with:  Facility Medical sales representativeContact Representative, Family Supports Permission granted to share information::  Yes, Verbal Permission Granted  Name::     Wife Bradly BienenstockRoseanne [phone]  Agency::  [facility] [phone]  Relationship::     Contact Information:     Housing/Transportation Living arrangements for the past 2 months:  Assisted Living Facility Source of Information:  Patient, Facility Patient Interpreter Needed:  None Criminal Activity/Legal Involvement Pertinent to Current Situation/Hospitalization:  No - Comment as needed Significant Relationships:  Friend, [employer] DodgeCommunity Support, Adult Children, Spouse Lives with:  Facility Resident Do you feel safe going back to the place where you live?  Yes Need for family participation in patient care:  No (Coment)  Care giving concerns:  [facility] since April 2018. Was in [facility] prior to that for rehab. No caregiving concerns reported by pt or family. At baseline ambulated with RW and 1 person assist. Requires assistance to bathe and dress, feeds himself independently. "Mild memory impairment" per facility, brought to ED when "he was very confused, that is not like him."  Office managerocial Worker assessment / plan:  CSW consulted as pt is admitted from facility- [facility].  Plans to return at [place]. PT eval pending. During last hospital admission this month pt/wife declined rehab when recommended, preferring continued care at ALF. Will follow up with pt/family pending therapy recommendations.  Facility will need updated FL2 at [place] which CSW will assist with.   Plan[facility] at [place].  Employment status:  Retired Glass blower/designernsurance  information:  Managed Medicare PT Recommendations:  Not assessed at this time Information / Referral to community resources:     Patient/Family's Response to care:  Pt pleasant but confused during assessment, minimal response.   Patient/Family's Understanding of and Emotional Response to Diagnosis, Current Treatment, and Prognosis:  See above, pt disoriented to situation. Family understanding of plan, emotional response unremarkable but appropriate.   Emotional Assessment Appearance:  Appears stated age Attitude/Demeanor/Rapport:   (pleasant, somewhat confused) Affect (typically observed):  Calm Orientation:  Oriented to Self, Oriented to Place Alcohol / Substance use:  Not Applicable Psych involvement (Current and /or in the community):  No (Comment)  Discharge Needs  Concerns to be addressed:  Discharge Planning Concerns Readmission within the last 30 days:  Yes Current discharge risk:  None Barriers to Discharge:  Continued Medical Work up   Terex CorporationMeghan R Makya Phillis, LCSW 05/29/2017, 9:31 AM  [phone]

## 2017-05-29 NOTE — NC FL2 (Signed)
[place] MEDICAID FL2 LEVEL OF CARE SCREENING TOOL     IDENTIFICATION  Patient Name: Craig Giles Birthdate: 1930-12-25 Sex: male Admission Date (Current Location): 05/28/2017  [facility] [place] Number:  Producer, television/film/video and Address:  [facility],  [address] [place], [place] [address]      Provider Number: [id number]  Attending Physician Name and Address:  Tyrone Nine, MD  Relative Name and Phone Number:       Current Level of Care: Hospital Recommended Level of Care: Skilled Nursing Facility Prior Approval Number:    Date Approved/Denied:   PASRR Number: [id number]  Discharge Plan: SNF    Current Diagnoses: Patient Active Problem List   Diagnosis Date Noted  . Generalized weakness 05/28/2017  . Indwelling Foley catheter present   . Rectal bleeding   . GI bleed 05/18/2017  . Pressure injury of skin 04/01/2017  . PNA (pneumonia) 03/31/2017  . Chronic insomnia 08/12/2016  . 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 Memory deficits 10/22/2013  . Abnormality of gait 11/10/2012  . Paralysis agitans (HCC) 11/10/2012    Orientation RESPIRATION BLADDER Height & Weight     Self, Place  Normal Indwelling catheter Weight:   Height:     BEHAVIORAL SYMPTOMS/MOOD NEUROLOGICAL BOWEL NUTRITION STATUS      Continent Diet (regular, thin fluid consistency)  AMBULATORY STATUS COMMUNICATION OF NEEDS Skin   Extensive Assist Verbally Other (Comment), PU Stage and Appropriate Care  pressure injury 05/18/17 stage II- partial thickness loss of dermis presenting as a shallow open ulcer with a red, pink wound bed without slough Location Sacrum                     Personal Care Assistance Level of Assistance  Bathing, Feeding, Dressing Bathing Assistance: Limited assistance Feeding assistance: Limited assistance Dressing Assistance: Limited assistance     Functional Limitations Info  Sight, Hearing, Speech Sight Info: Adequate Hearing Info:  Adequate Speech Info: Adequate    SPECIAL CARE FACTORS FREQUENCY  PT (By licensed PT), OT (By licensed OT)     PT Frequency: 5x OT Frequency: 5x            Contractures Contractures Info: Not present    Additional Factors Info  Code Status, Allergies Code Status Info: full Allergies Info: NKA           Current Medications (05/29/2017):  This is the current hospital active medication list Current Facility-Administered Medications  Medication Dose Route Frequency Provider Last Rate Last Dose  . acetaminophen (TYLENOL) tablet 650 mg  650 mg Oral Q6H PRN Amin, Ankit Chirag, MD       Or  . acetaminophen (TYLENOL) suppository 650 mg  650 mg Rectal Q6H PRN Amin, Ankit Chirag, MD      . carbidopa-levodopa (SINEMET IR) 25-100 MG per tablet immediate release 1 tablet  1 tablet Oral TID Dimple Nanas, MD   1 tablet at 05/29/17 1054  . ciprofloxacin (CIPRO) IVPB 200 mg  200 mg Intravenous Q12H Loralee Pacas R, RPH      . levothyroxine (SYNTHROID, LEVOTHROID) tablet 88 mcg  88 mcg Oral QAC breakfast Amin, Ankit Chirag, MD   88 mcg at 05/29/17 0751  . multivitamin with minerals tablet 1 tablet  1 tablet Oral Daily Amin, Loura Halt, MD   1 tablet at 05/29/17 1056  . polyethylene glycol (MIRALAX / GLYCOLAX) packet 17 g  17 g Oral Daily Amin, Loura Halt, MD   17  g at 05/29/17 1054  . rOPINIRole (REQUIP) tablet 5 mg  5 mg Oral Daily Amin, Ankit Chirag, MD   5 mg at 05/29/17 1056  . saccharomyces boulardii (FLORASTOR) capsule 250 mg  250 mg Oral Daily Amin, Ankit Chirag, MD   250 mg at 05/29/17 1056  . tamsulosin (FLOMAX) capsule 0.4 mg  0.4 mg Oral Daily Amin, Ankit Chirag, MD   0.4 mg at 05/29/17 1056  . vitamin C (ASCORBIC ACID) tablet 250 mg  250 mg Oral Daily Amin, Ankit Chirag, MD   250 mg at 05/29/17 1055  . zinc sulfate capsule 220 mg  220 mg Oral Daily Amin, Ankit Chirag, MD   220 mg at 05/29/17 1056     Discharge Medications: Please see discharge summary for a list of  discharge medications.  Relevant Imaging Results:  Relevant Lab Results:   Additional Information ss#[id number]  Nelwyn SalisburyMeghan R Brylee Mcgreal, LCSW

## 2017-05-29 NOTE — Care Management Obs Status (Signed)
MEDICARE OBSERVATION STATUS NOTIFICATION   Patient Details  Name: Craig Giles MRN: [id number] Date of Birth: 04/25/1931   Medicare Observation Status Notification Given:  Yes    Bartholome BillCLEMENTS, Nneoma Harral H, RN 05/29/2017, 4:08 PM

## 2017-05-29 NOTE — Care Management Obs Status (Deleted)
MEDICARE OBSERVATION STATUS NOTIFICATION   Patient Details  Name: Craig Giles MRN: [id number] Date of Birth: 01/28/1931   Medicare Observation Status Notification Given:       Bartholome BillCLEMENTS, Annet Manukyan H, RN 05/29/2017, 4:07 PM

## 2017-05-29 NOTE — Progress Notes (Signed)
PROGRESS NOTE  Craig Giles  EXB:[id number] DOB: 08/10/1931 DOA: 05/28/2017 PCP: Willow OraAndy, Camille L, MD   Brief Narrative: Craig DankerRaymond Giles is an 83 y.o. male with a history of hypothyroidism, Parkinson's disease, urinary retention, and memory deficit [facility] due to worsening generalized weakness, drowsiness, and poor appetite. He denied any fever, abdominal pain, N/V/D. Foley last changed >2 weeks prior. He had been discharged 6/19 on levaquin for UTI after an admission for rectal bleeding, having resolved and declined colonoscopy. The recommendation was for discharge to SNF, though the patient and family declined. In the ED he was generally weak, appearing dehydrated, but in no distress. WBC 13.2 and UA taken from an exchanged foley showed possible UTI. Cipro was started due to a history of pseudomonas and IV fluids given overnight. He had agitation overnight requiring mittens only, though mentation seems to have improved and he ate a double breakfast tray. He will be observed as antibiotics are transitioned to oral and IV fluids are stopped with plan for PT evaluation, possible SNF discharge.    Assessment & Plan: Active Problems:   Generalized weakness  Generalized weakness: Generalized deconditioning with possible underlying urinary tract infection. TSH mildly elevated at 7.265, though free T4 is normal at 0.88.  - Treat UTI as below - PT evaluation, anticipate safest discharge is to SNF  UTI: Complicated by chronic indwelling foley. UA shows trace leukocytes, normal WBC. Serum WBC shows relative leukocytosis when compared to previous WBC. - Change IV to PO cipro now that he's taking po - Monitor urine culture and blood cultures  Mild dehydration -IV fluids with dextrose. Oral diet when more awake -Accu-Cheks every 6 hours as he is not eating  Moderate protein calorie malnutrition -Encourage oral diet  Hypothyroidism: TSH mildly elevated at 7.265, though  free T4 is normal at 0.88.  - Continue home dose synthroid - Recheck TFTs in 4-6 weeks.  Chronic urinary retention - Foley exchanged 6/27 - Discussed with urology, Dr. Vernie Ammonsttelin. [facility] which will be rescheduled. The patient failed outpatient voiding trial, and it was recommended that the foley remain until follow up.   Parkinson's disease with dementia: - Continue home medications  DVT prophylaxis: SCDs (recent GI bleed) Code Status: Full Family Communication: Son and DIL at bedside Disposition Plan: To SNF if remains stable on po abx and without restraints x24 hrs on 6/29.   Consultants:   Urology, Dr. Vernie Ammonsttelin by phone only  Procedures:   Foley insertion 6/27  Antimicrobials:  Ciprofloxacin 6/27 >>    Subjective: Pt back to mental baseline per family, no complaints. Was very hungry this morning. No abdominal pain, N/V/D.   Objective: Vitals:   05/28/17 1757 05/28/17 2304 05/29/17 0540 05/29/17 1405  BP: 124/77 (!) 115/50 117/60 116/70  Pulse: 77 (!) 49 (!) 58 (!) 59  Resp: 18 20 18 18   Temp: 99.9 F (37.7 C) 99.5 F (37.5 C) 98.3 F (36.8 C) 98.5 F (36.9 C)  TempSrc: Oral Oral Oral Oral  SpO2: 98% 90% 91% 92%    Intake/Output Summary (Last 24 hours) at 05/29/17 1512 Last data filed at 05/29/17 0845  Gross per 24 hour  Intake              760 ml  Output             2350 ml  Net            -1590 ml  Examination: General exam: 83 y.o. male in no distress Respiratory system: Non-labored breathing room air. Clear to auscultation bilaterally.  Cardiovascular system: Regular rate and rhythm. No murmur, rub, or gallop. No JVD, and no pedal edema. Gastrointestinal system: Abdomen soft, non-tender, non-distended, with normoactive bowel sounds. No organomegaly or masses felt. GU: Foley in situ. Central nervous system: Alert, oriented to person and place. No focal neurological deficits. Extremities: Warm, no deformities Skin: No  rashes, lesions no ulcers Psychiatry: Judgement and insight appear marginal. Mood & affect appropriate.   Data Reviewed: I have personally reviewed following labs and imaging studies  CBC:  Recent Labs Lab 05/28/17 1439  WBC 13.2*  NEUTROABS 11.1*  HGB 13.1  HCT 38.9*  MCV 87.2  PLT 282   Basic Metabolic Panel:  Recent Labs Lab 05/28/17 1439 05/29/17 0344  NA 136 134*  K 4.2 3.7  CL 101 102  CO2 28 27  GLUCOSE 100* 107*  BUN 19 14  CREATININE 0.98 0.82  CALCIUM 9.6 8.7*   GFR: Estimated Creatinine Clearance: 58 mL/min (by C-G formula based on SCr of 0.82 mg/dL). Liver Function Tests:  Recent Labs Lab 05/28/17 1439 05/29/17 0344  AST 68* 53*  ALT 11* 29  ALKPHOS 149* 111  BILITOT 1.4* 1.4*  PROT 7.5 5.6*  ALBUMIN 3.7 2.7*   No results for input(s): LIPASE, AMYLASE in the last 168 hours. No results for input(s): AMMONIA in the last 168 hours. Coagulation Profile: No results for input(s): INR, PROTIME in the last 168 hours. Cardiac Enzymes: No results for input(s): CKTOTAL, CKMB, CKMBINDEX, TROPONINI in the last 168 hours. BNP (last 3 results) No results for input(s): PROBNP in the last 8760 hours. HbA1C: No results for input(s): HGBA1C in the last 72 hours. CBG:  Recent Labs Lab 05/28/17 2358 05/29/17 1217  GLUCAP 140* 108*   Lipid Profile: No results for input(s): CHOL, HDL, LDLCALC, TRIG, CHOLHDL, LDLDIRECT in the last 72 hours. Thyroid Function Tests:  Recent Labs  05/29/17 0340 05/29/17 0344  TSH  --  9.382*  FREET4 0.88  --    Anemia Panel: No results for input(s): VITAMINB12, FOLATE, FERRITIN, TIBC, IRON, RETICCTPCT in the last 72 hours. Urine analysis:    Component Value Date/Time   COLORURINE YELLOW 05/28/2017 1418   APPEARANCEUR HAZY (A) 05/28/2017 1418   LABSPEC 1.019 05/28/2017 1418   PHURINE 6.0 05/28/2017 1418   GLUCOSEU NEGATIVE 05/28/2017 1418   HGBUR LARGE (A) 05/28/2017 1418   BILIRUBINUR NEGATIVE 05/28/2017 1418    KETONESUR 5 (A) 05/28/2017 1418   PROTEINUR NEGATIVE 05/28/2017 1418   NITRITE NEGATIVE 05/28/2017 1418   LEUKOCYTESUR TRACE (A) 05/28/2017 1418   Recent Results (from the past 240 hour(s))  Urine culture     Status: None   Collection Time: 05/28/17  2:18 PM  Result Value Ref Range Status   Specimen Description URINE, CATHETERIZED  Final   Special Requests NONE  Final   Culture   Final    NO GROWTH Performed at [facility], [address] [place], [place] [address]    Report Status 05/29/2017 FINAL  Final      Radiology Studies: Dg Chest Port 1 View  Result Date: 05/28/2017 CLINICAL DATA:  Increase lethargy today. History of Parkinson's disease. EXAM: PORTABLE CHEST 1 VIEW COMPARISON:  Chest x-ray of Apr 01, 2017 FINDINGS: The right lung is well-expanded and clear. The mid and lower left lung are clear. The mid upper lumbar obscured by the patient's chin. The  cardiac silhouette is enlarged. The pulmonary vascularity is not engorged. There is tortuosity of the descending thoracic aorta. IMPRESSION: No acute cardiopulmonary abnormality is observed. Thoracic aortic atherosclerosis. Electronically Signed   By: David  [place] M.D.   On: 05/28/2017 15:00    Scheduled Meds: . carbidopa-levodopa  1 tablet Oral TID  . ciprofloxacin  500 mg Oral BID  . levothyroxine  88 mcg Oral QAC breakfast  . multivitamin with minerals  1 tablet Oral Daily  . polyethylene glycol  17 g Oral Daily  . ropinirole  5 mg Oral Daily  . saccharomyces boulardii  250 mg Oral Daily  . tamsulosin  0.4 mg Oral Daily  . vitamin C  250 mg Oral Daily  . zinc sulfate  220 mg Oral Daily   Continuous Infusions:   LOS: 0 days   Time spent: 25 minutes.  Hazeline Junker, MD [facility] Pager [phone]  If 7PM-7AM, please contact night-coverage [email or web address] Password [email or web address] 05/29/2017, 3:12 PM

## 2017-05-29 NOTE — Progress Notes (Signed)
Met with pt, wife, and son (son via phone). [facility] selected for SNF and CSW initiated [facility] authorization request. Per [facility], pt used 19 of his full-coverage SNF days, therefore has one full cost day left prior to being in copay days for rehab. Wife and son are agreeable. State they will be holding pt's bed at [facility] with plan for him to transition back there following rehab.  Awaiting [employer] insurance authorization for SNF. Will continue following.  Sharren Bridge, MSW, LCSW Clinical Social Work 05/29/2017 [phone]

## 2017-05-29 NOTE — Care Management Obs Status (Signed)
MEDICARE OBSERVATION STATUS NOTIFICATION   Patient Details  Name: Craig DankerRaymond Gras MRN: [id number] Date of Birth: 11/03/1931   Medicare Observation Status Notification Given:       Bartholome BillCLEMENTS, Maham Quintin H, RN 05/29/2017, 4:06 PM

## 2017-05-29 NOTE — Progress Notes (Signed)
Pharmacy Antibiotic Note  Danley DankerRaymond Mclamb is a 83 y.o. male admitted on 05/28/2017 with UTI.  Pharmacy has been consulted for Cipro dosing.  Plan:  Cipro 200mg  IV q12h  Follow up renal function & cultures    Temp (24hrs), Avg:99.3 F (37.4 C), Min:98.3 F (36.8 C), Max:99.9 F (37.7 C)   Recent Labs Lab 05/28/17 1439 05/28/17 1456 05/29/17 0344  WBC 13.2*  --   --   CREATININE 0.98  --  0.82  LATICACIDVEN  --  1.19  --     Estimated Creatinine Clearance: 58 mL/min (by C-G formula based on SCr of 0.82 mg/dL).    No Known Allergies  Antimicrobials this admission:  PTA Levaquin 6/27 Cipro >>  Dose adjustments this admission:  6/28 decrease cipro from 400mg  IV q12h to 200mg  q12h  Microbiology results:  6/27 BCx: sent 6/27 UCx: NGF  05/18/17 UCx: 10k Pseudomonas (S=cefepime, ceftaz, cipro, gent, zosyn; R=imipenem)   Thank you for allowing pharmacy to be a part of this patient's care.  Loralee PacasErin Oday Ridings, PharmD, BCPS Pager: [phone] 05/29/2017 11:19 AM

## 2017-05-30 DIAGNOSIS — R2689 Other abnormalities of gait and mobility: Secondary | ICD-10-CM | POA: Diagnosis not present

## 2017-05-30 DIAGNOSIS — E860 Dehydration: Secondary | ICD-10-CM | POA: Diagnosis not present

## 2017-05-30 DIAGNOSIS — F5104 Psychophysiologic insomnia: Secondary | ICD-10-CM | POA: Diagnosis not present

## 2017-05-30 DIAGNOSIS — R531 Weakness: Secondary | ICD-10-CM | POA: Diagnosis not present

## 2017-05-30 DIAGNOSIS — E039 Hypothyroidism, unspecified: Secondary | ICD-10-CM | POA: Diagnosis not present

## 2017-05-30 DIAGNOSIS — N200 Calculus of kidney: Secondary | ICD-10-CM | POA: Diagnosis not present

## 2017-05-30 DIAGNOSIS — R1311 Dysphagia, oral phase: Secondary | ICD-10-CM | POA: Diagnosis not present

## 2017-05-30 DIAGNOSIS — R4182 Altered mental status, unspecified: Secondary | ICD-10-CM | POA: Diagnosis not present

## 2017-05-30 DIAGNOSIS — R269 Unspecified abnormalities of gait and mobility: Secondary | ICD-10-CM | POA: Diagnosis not present

## 2017-05-30 DIAGNOSIS — K922 Gastrointestinal hemorrhage, unspecified: Secondary | ICD-10-CM | POA: Diagnosis not present

## 2017-05-30 DIAGNOSIS — J80 Acute respiratory distress syndrome: Secondary | ICD-10-CM | POA: Diagnosis not present

## 2017-05-30 DIAGNOSIS — R339 Retention of urine, unspecified: Secondary | ICD-10-CM | POA: Diagnosis not present

## 2017-05-30 DIAGNOSIS — N139 Obstructive and reflux uropathy, unspecified: Secondary | ICD-10-CM | POA: Diagnosis not present

## 2017-05-30 DIAGNOSIS — R404 Transient alteration of awareness: Secondary | ICD-10-CM | POA: Diagnosis not present

## 2017-05-30 DIAGNOSIS — N390 Urinary tract infection, site not specified: Secondary | ICD-10-CM | POA: Diagnosis not present

## 2017-05-30 DIAGNOSIS — M6281 Muscle weakness (generalized): Secondary | ICD-10-CM | POA: Diagnosis not present

## 2017-05-30 DIAGNOSIS — G20 Parkinson's disease: Secondary | ICD-10-CM | POA: Diagnosis not present

## 2017-05-30 DIAGNOSIS — R41841 Cognitive communication deficit: Secondary | ICD-10-CM | POA: Diagnosis not present

## 2017-05-30 DIAGNOSIS — N400 Enlarged prostate without lower urinary tract symptoms: Secondary | ICD-10-CM | POA: Diagnosis not present

## 2017-05-30 DIAGNOSIS — R278 Other lack of coordination: Secondary | ICD-10-CM | POA: Diagnosis not present

## 2017-05-30 DIAGNOSIS — N210 Calculus in bladder: Secondary | ICD-10-CM | POA: Diagnosis not present

## 2017-05-30 DIAGNOSIS — F0390 Unspecified dementia without behavioral disturbance: Secondary | ICD-10-CM | POA: Diagnosis not present

## 2017-05-30 LAB — T3, FREE: T3 FREE: 1.8 pg/mL — AB (ref 2.0–4.4)

## 2017-05-30 NOTE — Progress Notes (Signed)
Received SNF authorization [id number] (RUG: RVC) [facility] [place]. Next review date 06/01/17. Informed facility ([facility]). Spoke with pt's son- [facility]   Pt will transport via [facility]- CSW completed medical necessity form and arranged transportation. All information provided to facility via the HUB.  Report number for RN: [phone]  Ilean SkillMeghan Myosha Cuadras, MSW, LCSW Clinical Social Work 05/30/2017 [phone]

## 2017-05-30 NOTE — Progress Notes (Signed)
[facility]  Patient transported via EMS.

## 2017-05-30 NOTE — Clinical Social Work Placement (Signed)
Pt discharging to [facility] Room #203. Family provided permission for CSW to contact pt's ALF [facility] to advise he will be going to SNF at [facility]  CLINICAL SOCIAL WORK PLACEMENT  NOTE  Date:  05/30/2017  Patient Details  Name: Craig DankerRaymond Hornung MRN: [id number] Date of Birth: 01/23/1931  Clinical Social Work is seeking post-discharge placement for this patient at the Skilled  Nursing Facility level of care (*CSW will initial, date and re-position this form in  chart as items are completed):  Yes   Patient/family provided with [place] Clinical Social Work Department's list of facilities offering this level of care within the geographic area requested by the patient (or if unable, by the patient's family).  Yes   Patient/family informed of their freedom to choose among providers that offer the needed level of care, that participate in Medicare, Medicaid or managed care program needed by the patient, have an available bed and are willing to accept the patient.  Yes   Patient/family informed of [facility]'s ownership interest in [facility] and [facility], as well as of the fact that they are under no obligation to receive care at these facilities.  PASRR submitted to EDS on       PASRR number received on       Existing PASRR number confirmed on 05/29/17     FL2 transmitted to all facilities in geographic area requested by pt/family on 05/29/17     FL2 transmitted to all facilities within larger geographic area on       Patient informed that his/her managed care company has contracts with or will negotiate with certain facilities, including the following:        Yes   Patient/family informed of bed offers received.  [facility]     Physician [facility] and [facility]    [facility]  Patient to be transferred to facility  by [facility]     Patient family notified on 05/30/17 of transfer.  Name of family member notified:  wife Kaylyn LayerRoseann and son Marcy SalvoRaymond     PHYSICIAN       Additional Comment:    _______________________________________________ Nelwyn SalisburyMeghan R Evelean Bigler, LCSW 05/30/2017, 11:31 AM

## 2017-05-30 NOTE — Discharge Summary (Signed)
Physician Discharge Summary  Jadakiss Barish ZOX:[id number] DOB: 11/16/1931 DOA: 05/28/2017  PCP: Willow Ora, MD  Admit date: 05/28/2017 Discharge date: 05/30/2017  Admitted From: ALF Disposition: SNF   Recommendations for Outpatient Follow-up:  1. Please obtain BMP/CBC in one week 2. Please follow up on the following pending results: blood cultures NGTD at discharge.  3. Follow up with urology as scheduled, continue foley until that appointment per Dr. Vernie Ammons.   Home Health: N/A Equipment/Devices: N/A Discharge Condition: Stable CODE STATUS: Full Diet recommendation: As tolerated  Brief/Interim Summary: Hakan Caligiureis an 83 y.o.malewith a history of hypothyroidism, Parkinson's disease, urinary retention, and memory deficit brought from [facility] due to worsening generalized weakness, drowsiness, and poor appetite. He denied any fever, abdominal pain, N/V/D. Foley last changed >2 weeks prior. He had been discharged 6/19 on levaquin for UTI after an admission for rectal bleeding, having resolved and declined colonoscopy. The recommendation was for discharge to SNF, though the patient and family declined. In the ED he was generally weak, appearing dehydrated, but in no distress. WBC 13.2 and UA taken from an exchanged foley showed possible UTI. Cipro was started due to a history of pseudomonas and IV fluids given overnight. He had agitation overnight requiring mittens only, though mentation seems to have improved and he ate a double breakfast tray. He will be observed as antibiotics are transitioned to oral and IV fluids are stopped with plan for PT evaluation, possible SNF discharge.    Discharge Diagnoses:  Active Problems:   Generalized weakness  Generalized weakness: Generalized deconditioning with initial dehydration. TSH mildly elevated at 7.265, though free T4 is normal at 0.88.  - Treat UTI as below - PT evaluation, safest discharge is to SNF  Possible UTI:  Complicated by chronic indwelling foley. UA shows trace leukocytes, normal WBC. Lactate normal. Urine culture no growth.  - Initially on cipro due to hx pseudomonas, though culture is negative. Foley was changed in ED.  - Continue to monitor off abx. Continue probiotics for 2 weeks given exposure to abx.  - Monitor blood cultures  Mild dehydration: Resolved. UOP good.  - IV rehydration initially, but has picked up per oral intake significantly during admission.   Moderate protein calorie malnutrition - Continue to encourage per oral intake.  Hypothyroidism: TSH mildly elevated at 7.265, though free T4 is normal at 0.88.  - Continue home dose synthroid - Recheck TFTs in 4-6 weeks.   Chronic urinary retention - Foley exchanged 6/27 - Discussed with urology, Dr. Vernie Ammons. Pt had f/u appt at [facility] 7/2 which was rescheduled to 7/23. The patient failed outpatient voiding trial, and it was recommended that the foley remain until follow up.   Parkinson's disease with dementia: - Continue home medications  Discharge Instructions  Allergies as of 05/30/2017   No Known Allergies     Medication List    TAKE these medications   acetaminophen 325 MG tablet Commonly known as:  TYLENOL Take 650 mg by mouth every 6 (six) hours as needed for mild pain, moderate pain, fever or headache. What changed:  Another medication with the same name was removed. Continue taking this medication, and follow the directions you see here.   carbidopa-levodopa 25-100 MG tablet Commonly known as:  SINEMET IR Take 1 tablet by mouth 3 (three) times daily.   levothyroxine 88 MCG tablet Commonly known as:  SYNTHROID, LEVOTHROID Take 88 mcg by mouth daily before breakfast.   loperamide 2 MG tablet Commonly known as:  IMODIUM A-D Take 2 mg by mouth 4 (four) times daily as needed for diarrhea or loose stools.   magnesium hydroxide 400 MG/5ML suspension Commonly known as:  MILK OF MAGNESIA Take 30  mLs by mouth daily as needed for mild constipation.   MINTOX 200-200-20 MG/5ML suspension Generic drug:  alum & mag hydroxide-simeth Take 30 mLs by mouth every 6 (six) hours as needed for indigestion or heartburn.   multivitamin with minerals Tabs tablet Take 1 tablet by mouth daily.   polyethylene glycol packet Commonly known as:  MIRALAX / GLYCOLAX Take 17 g by mouth daily.   ROBAFEN 100 MG/5ML syrup Generic drug:  guaifenesin Take 200 mg by mouth 3 (three) times daily as needed for cough.   ropinirole 5 MG tablet Commonly known as:  REQUIP Take 5 mg by mouth daily.   saccharomyces boulardii 250 MG capsule Commonly known as:  FLORASTOR Take 250 mg by mouth daily.   tamsulosin 0.4 MG Caps capsule Commonly known as:  FLOMAX Take 1 capsule (0.4 mg total) by mouth daily.   traZODone 100 MG tablet Commonly known as:  DESYREL Take 1 tablet (100 mg total) by mouth at bedtime.   TRIPLE ANTIBIOTIC [phone] Oint Apply 1 application topically as needed (minor cuts).   vitamin C 250 MG tablet Commonly known as:  ASCORBIC ACID Take 250 mg by mouth daily.   zinc sulfate 220 (50 Zn) MG capsule Take 220 mg by mouth daily.      Contact information for after-discharge care    Destination    [facility] Follow up.   Specialty:  Skilled Nursing Facility Contact information: [address] [place] [address] [phone]             No Known Allergies  Consultations:  Urology, Dr. Vernie Ammons by phone only  Procedures/Studies: Dg Chest Port 1 View  Result Date: 05/28/2017 CLINICAL DATA:  Increase lethargy today. History of Parkinson's disease. EXAM: PORTABLE CHEST 1 VIEW COMPARISON:  Chest x-ray of Apr 01, 2017 FINDINGS: The right lung is well-expanded and clear. The mid and lower left lung are clear. The mid upper lumbar obscured by the patient's chin. The cardiac silhouette is enlarged. The pulmonary vascularity is not  engorged. There is tortuosity of the descending thoracic aorta. IMPRESSION: No acute cardiopulmonary abnormality is observed. Thoracic aortic atherosclerosis. Electronically Signed   By: David  [place] M.D.   On: 05/28/2017 15:00   Subjective: No complaints this morning. No significant agitation overnight. Eating well. Urine output good. No fevers, abd pain, N/V/D.   Discharge Exam: Vitals:   05/29/17 2030 05/30/17 0617  BP: (!) 89/49 (!) 92/52  Pulse: 71 (!) 58  Resp: 20 20  Temp: 100 F (37.8 C) 98.8 F (37.1 C)   General: Pt is alert, awake, not in acute distress Cardiovascular: RRR, no murmur, no JVD, no edema Respiratory: CTA bilaterally, nonlabored Abdominal: Soft, NT, ND, bowel sounds +  Labs: Basic Metabolic Panel:  Recent Labs Lab 05/28/17 1439 05/29/17 0344  NA 136 134*  K 4.2 3.7  CL 101 102  CO2 28 27  GLUCOSE 100* 107*  BUN 19 14  CREATININE 0.98 0.82  CALCIUM 9.6 8.7*   Liver Function Tests:  Recent Labs Lab 05/28/17 1439 05/29/17 0344  AST 68* 53*  ALT 11* 29  ALKPHOS 149* 111  BILITOT 1.4* 1.4*  PROT 7.5 5.6*  ALBUMIN 3.7 2.7*   CBC:  Recent Labs Lab 05/28/17 1439  WBC 13.2*  NEUTROABS 11.1*  HGB 13.1  HCT 38.9*  MCV 87.2  PLT 282   Thyroid function studies  Recent Labs  05/29/17 0344  TSH 9.382*   Urinalysis    Component Value Date/Time   COLORURINE YELLOW 05/28/2017 1418   APPEARANCEUR HAZY (A) 05/28/2017 1418   LABSPEC 1.019 05/28/2017 1418   PHURINE 6.0 05/28/2017 1418   GLUCOSEU NEGATIVE 05/28/2017 1418   HGBUR LARGE (A) 05/28/2017 1418   BILIRUBINUR NEGATIVE 05/28/2017 1418   KETONESUR 5 (A) 05/28/2017 1418   PROTEINUR NEGATIVE 05/28/2017 1418   NITRITE NEGATIVE 05/28/2017 1418   LEUKOCYTESUR TRACE (A) 05/28/2017 1418    Microbiology Recent Results (from the past 240 hour(s))  Urine culture     Status: None   Collection Time: 05/28/17  2:18 PM  Result Value Ref Range Status   Specimen Description URINE,  CATHETERIZED  Final   Special Requests NONE  Final   Culture   Final    NO GROWTH Performed at [facility], [address] [place], [place] [address]    Report Status 05/29/2017 FINAL  Final  Blood Culture (routine x 2)     Status: None (Preliminary result)   Collection Time: 05/28/17  2:30 PM  Result Value Ref Range Status   Specimen Description BLOOD LEFT ARM  Final   Special Requests   Final    BOTTLES DRAWN AEROBIC AND ANAEROBIC Blood Culture adequate volume   Culture   Final    NO GROWTH < 24 HOURS Performed at [facility], [address] [place], [place] [address]    Report Status PENDING  Incomplete  Blood Culture (routine x 2)     Status: None (Preliminary result)   Collection Time: 05/28/17  2:45 PM  Result Value Ref Range Status   Specimen Description BLOOD BLOOD LEFT HAND  Final   Special Requests   Final    BOTTLES DRAWN AEROBIC AND ANAEROBIC Blood Culture adequate volume   Culture   Final    NO GROWTH < 24 HOURS Performed at [facility], [address] [place], [place] [address]    Report Status PENDING  Incomplete    Time coordinating discharge: Approximately 40 minutes  Hazeline Junkeryan Areatha Kalata, MD  [facility] 05/30/2017, 11:11 AM Pager [phone]

## 2017-06-02 ENCOUNTER — Ambulatory Visit: Payer: Medicare Other | Admitting: Neurology

## 2017-06-02 DIAGNOSIS — G20 Parkinson's disease: Secondary | ICD-10-CM | POA: Diagnosis not present

## 2017-06-02 DIAGNOSIS — R339 Retention of urine, unspecified: Secondary | ICD-10-CM | POA: Diagnosis not present

## 2017-06-02 DIAGNOSIS — F0390 Unspecified dementia without behavioral disturbance: Secondary | ICD-10-CM | POA: Diagnosis not present

## 2017-06-02 DIAGNOSIS — N400 Enlarged prostate without lower urinary tract symptoms: Secondary | ICD-10-CM | POA: Diagnosis not present

## 2017-06-02 LAB — CULTURE, BLOOD (ROUTINE X 2)
CULTURE: NO GROWTH
Culture: NO GROWTH
SPECIAL REQUESTS: ADEQUATE
Special Requests: ADEQUATE

## 2017-06-12 DIAGNOSIS — N139 Obstructive and reflux uropathy, unspecified: Secondary | ICD-10-CM | POA: Diagnosis not present

## 2017-06-12 DIAGNOSIS — G20 Parkinson's disease: Secondary | ICD-10-CM | POA: Diagnosis not present

## 2017-06-12 DIAGNOSIS — E039 Hypothyroidism, unspecified: Secondary | ICD-10-CM | POA: Diagnosis not present

## 2017-06-12 DIAGNOSIS — F0390 Unspecified dementia without behavioral disturbance: Secondary | ICD-10-CM | POA: Diagnosis not present

## 2017-06-24 DIAGNOSIS — R4182 Altered mental status, unspecified: Secondary | ICD-10-CM | POA: Diagnosis not present

## 2017-06-24 DIAGNOSIS — G20 Parkinson's disease: Secondary | ICD-10-CM | POA: Diagnosis not present

## 2017-06-24 DIAGNOSIS — F5104 Psychophysiologic insomnia: Secondary | ICD-10-CM | POA: Diagnosis not present

## 2017-06-24 DIAGNOSIS — N401 Enlarged prostate with lower urinary tract symptoms: Secondary | ICD-10-CM | POA: Diagnosis not present

## 2017-06-24 DIAGNOSIS — M6281 Muscle weakness (generalized): Secondary | ICD-10-CM | POA: Diagnosis not present

## 2017-06-24 DIAGNOSIS — N182 Chronic kidney disease, stage 2 (mild): Secondary | ICD-10-CM | POA: Diagnosis not present

## 2017-06-24 DIAGNOSIS — Z466 Encounter for fitting and adjustment of urinary device: Secondary | ICD-10-CM | POA: Diagnosis not present

## 2017-06-24 DIAGNOSIS — G2581 Restless legs syndrome: Secondary | ICD-10-CM | POA: Diagnosis not present

## 2017-06-24 DIAGNOSIS — R339 Retention of urine, unspecified: Secondary | ICD-10-CM | POA: Diagnosis not present

## 2017-06-24 DIAGNOSIS — N138 Other obstructive and reflux uropathy: Secondary | ICD-10-CM | POA: Diagnosis not present

## 2017-06-29 DIAGNOSIS — M6281 Muscle weakness (generalized): Secondary | ICD-10-CM | POA: Diagnosis not present

## 2017-06-29 DIAGNOSIS — G20 Parkinson's disease: Secondary | ICD-10-CM | POA: Diagnosis not present

## 2017-06-29 DIAGNOSIS — R269 Unspecified abnormalities of gait and mobility: Secondary | ICD-10-CM | POA: Diagnosis not present

## 2017-06-29 DIAGNOSIS — R2689 Other abnormalities of gait and mobility: Secondary | ICD-10-CM | POA: Diagnosis not present

## 2017-07-15 DIAGNOSIS — N401 Enlarged prostate with lower urinary tract symptoms: Secondary | ICD-10-CM | POA: Diagnosis not present

## 2017-07-15 DIAGNOSIS — G20 Parkinson's disease: Secondary | ICD-10-CM | POA: Diagnosis not present

## 2017-07-15 DIAGNOSIS — E039 Hypothyroidism, unspecified: Secondary | ICD-10-CM | POA: Diagnosis not present

## 2017-07-15 DIAGNOSIS — N189 Chronic kidney disease, unspecified: Secondary | ICD-10-CM | POA: Diagnosis not present

## 2017-07-15 DIAGNOSIS — Z79899 Other long term (current) drug therapy: Secondary | ICD-10-CM | POA: Diagnosis not present

## 2017-07-22 DIAGNOSIS — Z79899 Other long term (current) drug therapy: Secondary | ICD-10-CM | POA: Diagnosis not present

## 2017-07-22 DIAGNOSIS — I952 Hypotension due to drugs: Secondary | ICD-10-CM | POA: Diagnosis not present

## 2017-07-22 DIAGNOSIS — G20 Parkinson's disease: Secondary | ICD-10-CM | POA: Diagnosis not present

## 2017-07-22 DIAGNOSIS — R5381 Other malaise: Secondary | ICD-10-CM | POA: Diagnosis not present

## 2017-07-23 DIAGNOSIS — R05 Cough: Secondary | ICD-10-CM | POA: Diagnosis not present

## 2017-07-30 DIAGNOSIS — M6281 Muscle weakness (generalized): Secondary | ICD-10-CM | POA: Diagnosis not present

## 2017-07-30 DIAGNOSIS — G4700 Insomnia, unspecified: Secondary | ICD-10-CM | POA: Diagnosis not present

## 2017-07-30 DIAGNOSIS — R2689 Other abnormalities of gait and mobility: Secondary | ICD-10-CM | POA: Diagnosis not present

## 2017-07-30 DIAGNOSIS — G20 Parkinson's disease: Secondary | ICD-10-CM | POA: Diagnosis not present

## 2017-07-30 DIAGNOSIS — N390 Urinary tract infection, site not specified: Secondary | ICD-10-CM | POA: Diagnosis not present

## 2017-07-30 DIAGNOSIS — E039 Hypothyroidism, unspecified: Secondary | ICD-10-CM | POA: Diagnosis not present

## 2017-07-30 DIAGNOSIS — R269 Unspecified abnormalities of gait and mobility: Secondary | ICD-10-CM | POA: Diagnosis not present

## 2017-08-26 DIAGNOSIS — N182 Chronic kidney disease, stage 2 (mild): Secondary | ICD-10-CM | POA: Diagnosis not present

## 2017-08-26 DIAGNOSIS — E039 Hypothyroidism, unspecified: Secondary | ICD-10-CM | POA: Diagnosis not present

## 2017-08-26 DIAGNOSIS — G2581 Restless legs syndrome: Secondary | ICD-10-CM | POA: Diagnosis not present

## 2017-08-26 DIAGNOSIS — R1311 Dysphagia, oral phase: Secondary | ICD-10-CM | POA: Diagnosis not present

## 2017-08-26 DIAGNOSIS — F0280 Dementia in other diseases classified elsewhere without behavioral disturbance: Secondary | ICD-10-CM | POA: Diagnosis not present

## 2017-08-26 DIAGNOSIS — F5104 Psychophysiologic insomnia: Secondary | ICD-10-CM | POA: Diagnosis not present

## 2017-08-26 DIAGNOSIS — N3941 Urge incontinence: Secondary | ICD-10-CM | POA: Diagnosis not present

## 2017-08-26 DIAGNOSIS — D631 Anemia in chronic kidney disease: Secondary | ICD-10-CM | POA: Diagnosis not present

## 2017-08-26 DIAGNOSIS — N401 Enlarged prostate with lower urinary tract symptoms: Secondary | ICD-10-CM | POA: Diagnosis not present

## 2017-08-26 DIAGNOSIS — G20 Parkinson's disease: Secondary | ICD-10-CM | POA: Diagnosis not present

## 2017-08-29 DIAGNOSIS — G4700 Insomnia, unspecified: Secondary | ICD-10-CM | POA: Diagnosis not present

## 2017-08-29 DIAGNOSIS — R54 Age-related physical debility: Secondary | ICD-10-CM | POA: Diagnosis not present

## 2017-08-29 DIAGNOSIS — E039 Hypothyroidism, unspecified: Secondary | ICD-10-CM | POA: Diagnosis not present

## 2017-08-29 DIAGNOSIS — N189 Chronic kidney disease, unspecified: Secondary | ICD-10-CM | POA: Diagnosis not present

## 2017-08-30 ENCOUNTER — Encounter (HOSPITAL_COMMUNITY): Payer: Self-pay

## 2017-08-30 ENCOUNTER — Emergency Department (HOSPITAL_COMMUNITY): Payer: Medicare Other

## 2017-08-30 ENCOUNTER — Emergency Department (HOSPITAL_COMMUNITY)
Admission: EM | Admit: 2017-08-30 | Discharge: 2017-08-30 | Disposition: A | Discharge status: Home / Self Care | Payer: Medicare Other | Attending: Emergency Medicine | Admitting: Emergency Medicine

## 2017-08-30 DIAGNOSIS — R350 Frequency of micturition: Secondary | ICD-10-CM | POA: Diagnosis not present

## 2017-08-30 DIAGNOSIS — Z79899 Other long term (current) drug therapy: Secondary | ICD-10-CM | POA: Insufficient documentation

## 2017-08-30 DIAGNOSIS — E039 Hypothyroidism, unspecified: Secondary | ICD-10-CM | POA: Diagnosis not present

## 2017-08-30 DIAGNOSIS — R5383 Other fatigue: Secondary | ICD-10-CM | POA: Diagnosis present

## 2017-08-30 DIAGNOSIS — R9431 Abnormal electrocardiogram [ECG] [EKG]: Secondary | ICD-10-CM | POA: Diagnosis not present

## 2017-08-30 DIAGNOSIS — N3001 Acute cystitis with hematuria: Secondary | ICD-10-CM | POA: Diagnosis not present

## 2017-08-30 DIAGNOSIS — G20 Parkinson's disease: Secondary | ICD-10-CM | POA: Diagnosis not present

## 2017-08-30 DIAGNOSIS — R4182 Altered mental status, unspecified: Secondary | ICD-10-CM | POA: Diagnosis not present

## 2017-08-30 DIAGNOSIS — R031 Nonspecific low blood-pressure reading: Secondary | ICD-10-CM | POA: Diagnosis not present

## 2017-08-30 DIAGNOSIS — N390 Urinary tract infection, site not specified: Secondary | ICD-10-CM | POA: Diagnosis not present

## 2017-08-30 DIAGNOSIS — R5381 Other malaise: Secondary | ICD-10-CM | POA: Diagnosis not present

## 2017-08-30 LAB — COMPREHENSIVE METABOLIC PANEL
ALK PHOS: 96 U/L (ref 38–126)
ALT: <5 U/L — ABNORMAL LOW (ref 17–63)
ANION GAP: 7 (ref 5–15)
AST: 28 U/L (ref 15–41)
Albumin: 3 g/dL — ABNORMAL LOW (ref 3.5–5.0)
BUN: 16 mg/dL (ref 6–20)
CALCIUM: 8.5 mg/dL — AB (ref 8.9–10.3)
CHLORIDE: 108 mmol/L (ref 101–111)
CO2: 23 mmol/L (ref 22–32)
Creatinine, Ser: 0.95 mg/dL (ref 0.61–1.24)
Glucose, Bld: 91 mg/dL (ref 65–99)
Potassium: 4.5 mmol/L (ref 3.5–5.1)
SODIUM: 138 mmol/L (ref 135–145)
Total Bilirubin: 1 mg/dL (ref 0.3–1.2)
Total Protein: 5.9 g/dL — ABNORMAL LOW (ref 6.5–8.1)

## 2017-08-30 LAB — CBC WITH DIFFERENTIAL/PLATELET
Basophils Absolute: 0.1 10*3/uL (ref 0.0–0.1)
Basophils Relative: 1 %
EOS ABS: 0.1 10*3/uL (ref 0.0–0.7)
EOS PCT: 1 %
HCT: 34.9 % — ABNORMAL LOW (ref 39.0–52.0)
Hemoglobin: 11.3 g/dL — ABNORMAL LOW (ref 13.0–17.0)
LYMPHS ABS: 1.1 10*3/uL (ref 0.7–4.0)
Lymphocytes Relative: 12 %
MCH: 29 pg (ref 26.0–34.0)
MCHC: 32.4 g/dL (ref 30.0–36.0)
MCV: 89.7 fL (ref 78.0–100.0)
MONOS PCT: 6 %
Monocytes Absolute: 0.6 10*3/uL (ref 0.1–1.0)
Neutro Abs: 7.3 10*3/uL (ref 1.7–7.7)
Neutrophils Relative %: 80 %
PLATELETS: 188 10*3/uL (ref 150–400)
RBC: 3.89 MIL/uL — ABNORMAL LOW (ref 4.22–5.81)
RDW: 15.3 % (ref 11.5–15.5)
WBC: 9.2 10*3/uL (ref 4.0–10.5)

## 2017-08-30 LAB — TSH: TSH: 2.228 u[IU]/mL (ref 0.350–4.500)

## 2017-08-30 LAB — URINALYSIS, ROUTINE W REFLEX MICROSCOPIC
BILIRUBIN URINE: NEGATIVE
Glucose, UA: NEGATIVE mg/dL
NITRITE: NEGATIVE
PROTEIN: 30 mg/dL — AB
SPECIFIC GRAVITY, URINE: 1.015 (ref 1.005–1.030)
pH: 7 (ref 5.0–8.0)

## 2017-08-30 LAB — CBG MONITORING, ED: Glucose-Capillary: 104 mg/dL — ABNORMAL HIGH (ref 65–99)

## 2017-08-30 LAB — URINALYSIS, MICROSCOPIC (REFLEX): Squamous Epithelial / LPF: NONE SEEN

## 2017-08-30 LAB — I-STAT CG4 LACTIC ACID, ED: LACTIC ACID, VENOUS: 0.94 mmol/L (ref 0.5–1.9)

## 2017-08-30 LAB — LIPASE, BLOOD: LIPASE: 24 U/L (ref 11–51)

## 2017-08-30 MED ORDER — SODIUM CHLORIDE 0.9 % IV BOLUS (SEPSIS)
1000.0000 mL | Freq: Once | INTRAVENOUS | Status: AC
  Administered 2017-08-30: 1000 mL via INTRAVENOUS

## 2017-08-30 MED ORDER — SODIUM CHLORIDE 0.9 % IV BOLUS (SEPSIS)
500.0000 mL | Freq: Once | INTRAVENOUS | Status: AC
  Administered 2017-08-30: 500 mL via INTRAVENOUS

## 2017-08-30 MED ORDER — DEXTROSE 5 % IV SOLN
1.0000 g | Freq: Once | INTRAVENOUS | Status: AC
  Administered 2017-08-30: 1 g via INTRAVENOUS
  Filled 2017-08-30: qty 10

## 2017-08-30 MED ORDER — CEPHALEXIN 500 MG PO CAPS: 500.0000 mg | ORAL_CAPSULE | Freq: Three times a day (TID) | ORAL | 0 refills | Status: AC

## 2017-08-30 NOTE — ED Provider Notes (Signed)
Received care of patient from Dr. Rush Landmark. Please see his note for prior history and physical. Briefly, this is an 83 year old male who presented with concern for low blood pressure on recheck at his facility.  On arrival to the emergency department, he had blood pressure 100 systolic.  Labs show no sign of acute kidney injury, show a normal lactic acid, and no other significant abnormalities. He has mild anemia. TSH within normal limits. Chest x-ray without signs of infection. Urinalysis consistent with urinary tract infection. Patient was given Rocephin, with plan to give another liter of normal saline. Plan is to continue to monitor patient as he receives saline for rehydration, and discharge.   I evaluated patient who is well appearing, asking to eat lasagna.  His blood pressures have remained stable.  There are no signs of sepsis at this time, however discussed strict return precautions. Gave rx for keflex. Patient discharged in stable condition with understanding of reasons to return.    Alvira Monday, MD 08/30/17 [place]

## 2017-08-30 NOTE — ED Provider Notes (Signed)
[facility]-EMERGENCY DEPT Provider Note   CSN: [id number] Arrival date & time: 08/30/17  1304     History   Chief Complaint No chief complaint on file.   HPI Craig Giles is a 83 y.o. male.  The history is provided by the patient, medical records and the spouse. The history is limited by the condition of the patient.  Illness  This is a new problem. The current episode started 12 to 24 hours ago. The problem occurs constantly. The problem has not changed since onset.Pertinent negatives include no chest pain, no abdominal pain, no headaches and no shortness of breath. Associated symptoms comments: Hypotension, fatigue, frequency, cough . Nothing aggravates the symptoms. Nothing relieves the symptoms. He has tried nothing for the symptoms. The treatment provided no relief.    Past Medical History:  Diagnosis Date  . Hypothyroidism   . Memory deficits 10/22/2013  . Parkinson disease (HCC)   . 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 Generalized weakness 05/28/2017  . Indwelling Foley catheter present   . Rectal bleeding   . GI bleed 05/18/2017  . Pressure injury of skin 04/01/2017  . PNA (pneumonia) 03/31/2017  . Chronic insomnia 08/12/2016  . 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 Memory deficits 10/22/2013  . Abnormality of gait 11/10/2012  . Paralysis agitans (HCC) 11/10/2012    Past Surgical History:  Procedure Laterality Date  . TONSILLECTOMY         Home Medications    Prior to Admission medications   Medication Sig Start Date End Date Taking? Authorizing Provider  acetaminophen (TYLENOL) 325 MG tablet Take 650 mg by mouth every 6 (six) hours as needed for mild pain, moderate pain, fever or headache.     [provider]  alum & mag hydroxide-simeth (MINTOX) 200-200-20 MG/5ML suspension Take 30 mLs by mouth every 6 (six) hours as needed for indigestion or heartburn.    [provider]  carbidopa-levodopa (SINEMET IR) 25-100 MG tablet Take 1 tablet by mouth 3 (three) times daily. 12/30/16   York Spaniel, MD  guaifenesin (ROBAFEN) 100 MG/5ML syrup Take 200 mg by mouth 3 (three) times daily as needed for cough.    [provider]  levothyroxine (SYNTHROID, LEVOTHROID) 88 MCG tablet Take 88 mcg by mouth daily before breakfast.     [provider]  loperamide (IMODIUM A-D) 2 MG tablet Take 2 mg by mouth 4 (four) times daily as needed for diarrhea or loose stools.    [provider]  magnesium hydroxide (MILK OF MAGNESIA) 400 MG/5ML suspension Take 30 mLs by mouth daily as needed for mild constipation.    [provider]  Multiple Vitamin (MULTIVITAMIN WITH MINERALS) TABS tablet Take 1 tablet by mouth daily.    [provider]  Neomycin-Bacitracin-Polymyxin (TRIPLE ANTIBIOTIC) 3.5-848 103 2020 OINT Apply 1 application topically as needed (minor cuts).    [provider]  polyethylene glycol (MIRALAX / GLYCOLAX) packet Take 17 g by mouth daily.     [provider]  ropinirole (REQUIP) 5 MG tablet Take 5 mg by mouth daily.    [provider]  saccharomyces boulardii (FLORASTOR) 250 MG capsule Take 250 mg by mouth daily.    [provider]  tamsulosin (FLOMAX) 0.4 MG CAPS capsule Take 1 capsule (0.4 mg total) by mouth daily. 04/04/17   [name], MD  traZODone (DESYREL) 100 MG tablet Take 1 tablet (100 mg total) by  mouth at bedtime. 12/30/16   York Spaniel, MD  vitamin C (ASCORBIC ACID) 250 MG tablet Take 250 mg by mouth daily.    [provider]  zinc sulfate 220 (50 Zn) MG capsule Take 220 mg by mouth daily.    [provider]    Family History Family History  Problem Relation Age of Onset  . Diabetes Brother     Social History Social History  Substance Use Topics  . Smoking status: Never Smoker  . Smokeless tobacco: Never Used  . Alcohol use No     Allergies     Patient has no known allergies.   Review of Systems Review of Systems  Constitutional: Positive for chills and fatigue. Negative for diaphoresis and fever.  HENT: Negative for congestion.   Respiratory: Positive for cough. Negative for chest tightness, shortness of breath, wheezing and stridor.   Cardiovascular: Negative for chest pain, palpitations and leg swelling.  Gastrointestinal: Positive for nausea. Negative for abdominal pain, constipation, diarrhea and vomiting.  Genitourinary: Positive for frequency. Negative for decreased urine volume, difficulty urinating and flank pain.  Musculoskeletal: Negative for back pain, neck pain and neck stiffness.  Skin: Negative for rash and wound.  Neurological: Negative for weakness, light-headedness, numbness and headaches.  Psychiatric/Behavioral: Positive for confusion (at baseline). Negative for agitation.  All other systems reviewed and are negative.    Physical Exam Updated Vital Signs There were no vitals taken for this visit.  Physical Exam  Constitutional: He appears well-developed and well-nourished. No distress.  HENT:  Head: Normocephalic and atraumatic.  Mouth/Throat: Oropharynx is clear and moist. No oropharyngeal exudate.  Eyes: Pupils are equal, round, and reactive to light. Conjunctivae and EOM are normal.  Neck: Normal range of motion.  Cardiovascular: Normal rate and intact distal pulses.   No murmur heard. Pulmonary/Chest: Effort normal and breath sounds normal. No stridor. No respiratory distress. He has no wheezes. He has no rales. He exhibits no tenderness.  Abdominal: There is no tenderness.  Musculoskeletal: He exhibits no edema or tenderness.  Neurological: He is alert. He is disoriented (told EMS he was in [place]). No sensory deficit. He exhibits normal muscle tone. GCS eye subscore is 3. GCS verbal subscore is 5. GCS motor subscore is 6.  Patient is pleasantly confused. atient disoriented to place.  Skin:  Capillary refill takes less than 2 seconds. He is not diaphoretic. No erythema. No pallor.  Nursing note and vitals reviewed.    ED Treatments / Results  Labs (all labs ordered are listed, but only abnormal results are displayed) Labs Reviewed  CBC WITH DIFFERENTIAL/PLATELET - Abnormal; Notable for the following:       Result Value   RBC 3.89 (*)    Hemoglobin 11.3 (*)    HCT 34.9 (*)    All other components within normal limits  COMPREHENSIVE METABOLIC PANEL - Abnormal; Notable for the following:    Calcium 8.5 (*)    Total Protein 5.9 (*)    Albumin 3.0 (*)    ALT <5 (*)    All other components within normal limits  URINALYSIS, ROUTINE W REFLEX MICROSCOPIC - Abnormal; Notable for the following:    APPearance TURBID (*)    Hgb urine dipstick MODERATE (*)    Ketones, ur TRACE (*)    Protein, ur 30 (*)    Leukocytes, UA LARGE (*)    All other components within normal limits  URINALYSIS, MICROSCOPIC (REFLEX) - Abnormal; Notable for the following:  Bacteria, UA MANY (*)    All other components within normal limits  CBG MONITORING, ED - Abnormal; Notable for the following:    Glucose-Capillary 104 (*)    All other components within normal limits  URINE CULTURE  LIPASE, BLOOD  TSH  I-STAT CG4 LACTIC ACID, ED    EKG  EKG Interpretation  Date/Time:  Saturday August 30 2017 13:38:25 EDT Ventricular Rate:  62 PR Interval:    QRS Duration: 110 QT Interval:  453 QTC Calculation: 460 R Axis:   -10 Text Interpretation:  Sinus rhythm Abnormal R-wave progression, early transition When copared to prior, no significant changes seen.  No STEMI Confirmed by Theda Belfast ([id number]) on 08/30/2017 2:13:03 PM       Radiology Dg Chest 2 View  Result Date: 08/30/2017 CLINICAL DATA:  83 year old male with a history of lethargy and mental status changes EXAM: CHEST  2 VIEW COMPARISON:  05/28/2017, 04/01/2017 FINDINGS: Cardiomediastinal silhouette unchanged in size and contour. Lung  apices partially obscured by overlying soft tissues of the neck. Low lung volumes with linear opacities at the lung bases. Blunting of the costophrenic sulcus on the lateral view. Ill-defined airspace opacity at the posterior lung base, although not significantly changed from the chest x-ray of 04/01/2017. No pneumothorax. IMPRESSION: Low lung volumes with chronic lung changes/scar and no evidence of lobar pneumonia. Electronically Signed   By: Gilmer Mor D.O.   On: 08/30/2017 14:07    Procedures Procedures (including critical care time)  Medications Ordered in ED Medications  sodium chloride 0.9 % bolus 1,000 mL (not administered)  cefTRIAXone (ROCEPHIN) 1 g in dextrose 5 % 50 mL IVPB (not administered)  sodium chloride 0.9 % bolus 500 mL (500 mLs Intravenous New Bag/Given 08/30/17 1458)     Initial Impression / Assessment and Plan / ED Course  I have reviewed the triage vital signs and the nursing notes.  Pertinent labs & imaging results that were available during my care of the patient were reviewed by me and considered in my medical decision making (see chart for details).     Craig Giles is a 83 y.o. male with a past medical history significant for Parkinson's, hypothyroidism, and prior GI bleed who presents from his nursing facility for hypotension, fatigue, somnolence, nausea, urinary frequency, and cough. Patient is accompanied by wife who reports that the last couple of days, patient has been more fatigued. She thinks she is dehydrated. She reports that he has "not been drinking any water". He reports that he has had a mild chill but no fevers. He denies any dysuria but does report some urinary frequency. He uses a diaper but no longer uses a Foley catheter as he previously had. He has had some nausea but no vomiting. No constipation or diarrhea. He reports no chest pain or shortness of breath but reports a very mild phlegm productive cough. He denies any headaches, vision  changes, or any other complaints. According to EMS, patient had his daily blood pressure check and was found to have a pressure of 87/46 prompting him to come to the ED for evaluation to discover etiology of hypotension.  Patient received some fluid in transport and blood pressure improved to 110 on arrival.   Patient will be given another 500 mL of normal saline and reassessed.   Given patient's symptoms, he will have workup to look for occult infection such as pneumonia or UTI. Also considering dehydration given the wife's report of  Decreased fluid intake. Patient will  have workup to look for electrolyte abnormalities or other end organ damage.  Anticipate following patient's blood pressures after his fluids. If patient is not found to have an infection, has reassuring labs/imaging, and is found to have no other acute abnormalities, suspect patient may be stable for discharge back to his facility; however, if patient returns to hypotension or has other abnormalities found, he may require admission.       4:04 PM Diagnostic testing returned showing normal lactic acid. No leukocytosis. Urinalysis was collected and although there were no nitrites or squamous cells, there was leukocytes and many bacteria. Patient will be treatd for UTI in the setting of his soft blood pressure and urinary frequency.  TSH also returned normal.   Patient's blood pressures remained non-hypotensive in the ED. Family requested patient be given more fluids, 1 more liter will be provided given his likely dehydration. After fluids and antibiotics, if patient's blood pressures remained stable, patient will be safe for discharge home.   Care transferred to Dr. Dalene Seltzer for reassessment after fluids and antibiotics.   Final Clinical Impressions(s) / ED Diagnoses   Final diagnoses:  Acute cystitis with hematuria    Clinical Impression: 1. Acute cystitis with hematuria     Disposition: Care transferred to Dr.  Dalene Seltzer.      Jahlia Omura, Canary Brim, MD 08/30/17 1740

## 2017-08-30 NOTE — ED Notes (Signed)
I stuck patient x2 for lab work. I was unsuccessful x2, RN [employer] notified

## 2017-08-30 NOTE — ED Notes (Signed)
[facility] CALL AND WAITING FOR TRANSPORT.

## 2017-08-30 NOTE — ED Triage Notes (Signed)
Patient present here to ed by ems from [facility] with c/o hypotension and lethargic and decline mental status. Pt BP 87/46 at the facility. Pt have parkinson disease. Pt is wheelchair.

## 2017-08-30 NOTE — ED Notes (Signed)
Bed: WU98 Expected date:  Expected time:  Means of arrival:  Comments: 83 yo hypotensive, lethargic

## 2017-08-31 LAB — URINE CULTURE

## 2017-09-02 DIAGNOSIS — R1310 Dysphagia, unspecified: Secondary | ICD-10-CM | POA: Diagnosis not present

## 2017-09-02 DIAGNOSIS — N390 Urinary tract infection, site not specified: Secondary | ICD-10-CM | POA: Diagnosis not present

## 2017-09-02 DIAGNOSIS — E860 Dehydration: Secondary | ICD-10-CM | POA: Diagnosis not present

## 2017-09-02 DIAGNOSIS — Z79899 Other long term (current) drug therapy: Secondary | ICD-10-CM | POA: Diagnosis not present

## 2017-09-18 ENCOUNTER — Emergency Department (HOSPITAL_COMMUNITY)
Admission: EM | Admit: 2017-09-18 | Discharge: 2017-09-18 | Disposition: A | Discharge status: Skilled Nursing Facility | Payer: Medicare Other | Attending: Emergency Medicine | Admitting: Emergency Medicine

## 2017-09-18 ENCOUNTER — Encounter (HOSPITAL_COMMUNITY): Payer: Self-pay | Admitting: Emergency Medicine

## 2017-09-18 DIAGNOSIS — E039 Hypothyroidism, unspecified: Secondary | ICD-10-CM | POA: Insufficient documentation

## 2017-09-18 DIAGNOSIS — Y939 Activity, unspecified: Secondary | ICD-10-CM | POA: Diagnosis not present

## 2017-09-18 DIAGNOSIS — Y999 Unspecified external cause status: Secondary | ICD-10-CM | POA: Insufficient documentation

## 2017-09-18 DIAGNOSIS — W06XXXA Fall from bed, initial encounter: Secondary | ICD-10-CM | POA: Diagnosis not present

## 2017-09-18 DIAGNOSIS — S0003XA Contusion of scalp, initial encounter: Secondary | ICD-10-CM | POA: Insufficient documentation

## 2017-09-18 DIAGNOSIS — S098XXA Other specified injuries of head, initial encounter: Secondary | ICD-10-CM | POA: Diagnosis not present

## 2017-09-18 DIAGNOSIS — G20 Parkinson's disease: Secondary | ICD-10-CM | POA: Insufficient documentation

## 2017-09-18 DIAGNOSIS — Z79899 Other long term (current) drug therapy: Secondary | ICD-10-CM | POA: Insufficient documentation

## 2017-09-18 DIAGNOSIS — S064X0A Epidural hemorrhage without loss of consciousness, initial encounter: Secondary | ICD-10-CM | POA: Diagnosis not present

## 2017-09-18 DIAGNOSIS — Y929 Unspecified place or not applicable: Secondary | ICD-10-CM | POA: Diagnosis not present

## 2017-09-18 DIAGNOSIS — S0990XA Unspecified injury of head, initial encounter: Secondary | ICD-10-CM | POA: Diagnosis present

## 2017-09-18 NOTE — ED Provider Notes (Signed)
[facility]-EMERGENCY DEPT Provider Note: Lowella DellJ. Lane Becca Bayne, MD, FACEP  CSN: [id number] MRN: [id number] ARRIVAL: 09/18/17 at 0130 ROOM: WA16/WA16   CHIEF COMPLAINT  Fall   HISTORY OF PRESENT ILLNESS  09/18/17 3:39 AM Craig Giles is a [age] y.o. male with Parkinson's disease. He was reaching out of bed to get to some doughnuts on his nightstand earlier this morning when he fell out of bed. This was not witnessed. He does have a small hematoma to his right parietal scalp but otherwise denies pain or other injury. He has been awake and alert. He has not been vomiting.    Past Medical History:  Diagnosis Date  . Hypothyroidism   . Memory deficits 10/22/2013  . Parkinson disease (HCC)   . 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 TONSILLECTOMY      Family History  Problem Relation Age of Onset  . Diabetes Brother     Social History  Substance Use Topics  . Smoking status: Never Smoker  . Smokeless tobacco: Never Used  . Alcohol use No    Prior to Admission medications   Medication Sig Start Date End Date Taking? Authorizing Provider  carbidopa-levodopa (SINEMET IR) 25-100 MG tablet Take 1 tablet by mouth 3 (three) times daily. 12/30/16  Yes York SpanielWillis, Charles K, MD  levothyroxine (SYNTHROID, LEVOTHROID) 88 MCG tablet Take 88 mcg by mouth daily before breakfast.    Yes [provider]  Multiple Vitamin (MULTIVITAMIN WITH MINERALS) TABS tablet Take 1 tablet by mouth daily.   Yes [provider]  polyethylene glycol (MIRALAX / GLYCOLAX) packet Take 17 g by mouth daily.    Yes [provider]  ropinirole (REQUIP) 5 MG tablet Take 5 mg by mouth daily.   Yes [provider]  saccharomyces boulardii (FLORASTOR) 250 MG capsule Take 250 mg by mouth daily.   Yes [provider]  traZODone (DESYREL) 100 MG tablet Take 1 tablet (100 mg total) by mouth at bedtime. 12/30/16  Yes York SpanielWillis, Charles K, MD   vitamin C (ASCORBIC ACID) 250 MG tablet Take 250 mg by mouth daily.   Yes [provider]  zinc sulfate 220 (50 Zn) MG capsule Take 220 mg by mouth daily.   Yes [provider]  acetaminophen (TYLENOL) 325 MG tablet Take 650 mg by mouth every 6 (six) hours as needed for mild pain, moderate pain, fever or headache.     [provider]  alum & mag hydroxide-simeth (MINTOX) 200-200-20 MG/5ML suspension Take 30 mLs by mouth every 6 (six) hours as needed for indigestion or heartburn.    [provider]  guaifenesin (ROBAFEN) 100 MG/5ML syrup Take 200 mg by mouth 3 (three) times daily as needed for cough.    [provider]  loperamide (IMODIUM A-D) 2 MG tablet Take 2 mg by mouth 4 (four) times daily as needed for diarrhea or loose stools.    [provider]  tamsulosin (FLOMAX) 0.4 MG CAPS capsule Take 1 capsule (0.4 mg total) by mouth daily. Patient not taking: Reported on 08/30/2017 04/04/17   Arnetha CourserAmin, Sumayya, MD    Allergies Patient has no known allergies.   REVIEW OF SYSTEMS  Negative except as noted here or in the History of Present Illness.   PHYSICAL EXAMINATION  Initial Vital Signs Blood pressure 133/78, pulse 73, temperature 97.8 F (36.6 C), temperature source Oral, resp. rate 15, height 5\' 3"  (1.6 m), weight 62.6 kg (138 lb),  SpO2 99 %.  Examination General: Well-developed, well-nourished male in no acute distress; appearance consistent with age of record HENT: normocephalic; superficial hematoma and abrasion to right parietal scalp with minimal tenderness Eyes: pupils equal, round and reactive to light; extraocular muscles intact; lens implants Neck: decreased range of motion due to apparent chronic changes Heart: regular rate and rhythm Lungs: clear to auscultation bilaterally Abdomen: soft; nondistended; nontender; bowel sounds present Extremities: arthritic changes; no tenderness on passive range of motion Neurologic: Awake,  alert and oriented; motor function intact in all extremities and symmetric; no facial droop; resting tremor of hands Skin: Warm and dry Psychiatric: Normal mood and affect   RESULTS  Summary of this visit's results, reviewed by myself:   EKG Interpretation  Date/Time:    Ventricular Rate:    PR Interval:    QRS Duration:   QT Interval:    QTC Calculation:   R Axis:     Text Interpretation:        Laboratory Studies: No results found for this or any previous visit (from the past 24 hour(s)). Imaging Studies: No results found.  ED COURSE  Nursing notes and initial vitals signs, including pulse oximetry, reviewed.  Vitals:   09/18/17 0131 09/18/17 0139 09/18/17 0145  BP:  133/78   Pulse:  73   Resp:  15   Temp:  97.8 F (36.6 C)   TempSrc:  Oral   SpO2: 95% 99%   Weight:   62.6 kg (138 lb)  Height:   5\' 3"  (1.6 m)    PROCEDURES    ED DIAGNOSES     ICD-10-CM   1. Fall from bed, initial encounter W06.XXXA   2. Hematoma of right parietal scalp, initial encounter S00.Marchia Bond, MD 09/18/17 [phone]

## 2017-09-18 NOTE — ED Triage Notes (Signed)
[facility] Unwitnessed fall, pt found lying in floor beside bed. Pt reports he was trying to slide to edge of bed to reach a doughnut from bedside table and slit too far. Pt denies LOC, pain, vision changes or dizziness. Pt states he just wants to be checked out. Pt concerned that his higher than his norm. Pt has hx of Parkinson's. Hematoma noted to Rt temporal area. Patient is not on any blood thinners.

## 2017-09-18 NOTE — ED Notes (Signed)
Bed: [id number] Expected date:  Expected time:  Means of arrival:  Comments: EMS 83 yo male from [facility] fall-no injuries-SNF sending for check

## 2017-09-18 NOTE — ED Notes (Signed)
[facility] called for transportation  

## 2017-09-18 NOTE — ED Notes (Signed)
[facility] leaving with patient.  

## 2017-09-23 DIAGNOSIS — I952 Hypotension due to drugs: Secondary | ICD-10-CM | POA: Diagnosis not present

## 2017-09-23 DIAGNOSIS — N189 Chronic kidney disease, unspecified: Secondary | ICD-10-CM | POA: Diagnosis not present

## 2017-09-23 DIAGNOSIS — G308 Other Alzheimer's disease: Secondary | ICD-10-CM | POA: Diagnosis not present

## 2017-09-23 DIAGNOSIS — E039 Hypothyroidism, unspecified: Secondary | ICD-10-CM | POA: Diagnosis not present

## 2017-09-24 DIAGNOSIS — H1032 Unspecified acute conjunctivitis, left eye: Secondary | ICD-10-CM | POA: Diagnosis not present

## 2017-09-24 DIAGNOSIS — R4182 Altered mental status, unspecified: Secondary | ICD-10-CM | POA: Diagnosis not present

## 2017-09-24 DIAGNOSIS — G20 Parkinson's disease: Secondary | ICD-10-CM | POA: Diagnosis not present

## 2017-09-25 DIAGNOSIS — Z23 Encounter for immunization: Secondary | ICD-10-CM | POA: Diagnosis not present

## 2017-10-02 DIAGNOSIS — R339 Retention of urine, unspecified: Secondary | ICD-10-CM | POA: Diagnosis not present

## 2017-10-17 ENCOUNTER — Encounter (HOSPITAL_COMMUNITY): Payer: Self-pay

## 2017-10-17 ENCOUNTER — Other Ambulatory Visit: Payer: Self-pay

## 2017-10-17 ENCOUNTER — Emergency Department (HOSPITAL_COMMUNITY): Payer: Medicare Other

## 2017-10-17 ENCOUNTER — Emergency Department (HOSPITAL_COMMUNITY)
Admission: EM | Admit: 2017-10-17 | Discharge: 2017-10-17 | Disposition: A | Discharge status: Home / Self Care | Payer: Medicare Other | Attending: Emergency Medicine | Admitting: Emergency Medicine

## 2017-10-17 DIAGNOSIS — R4020 Unspecified coma: Secondary | ICD-10-CM | POA: Diagnosis not present

## 2017-10-17 DIAGNOSIS — E039 Hypothyroidism, unspecified: Secondary | ICD-10-CM | POA: Diagnosis not present

## 2017-10-17 DIAGNOSIS — Z79899 Other long term (current) drug therapy: Secondary | ICD-10-CM | POA: Insufficient documentation

## 2017-10-17 DIAGNOSIS — N390 Urinary tract infection, site not specified: Secondary | ICD-10-CM | POA: Diagnosis not present

## 2017-10-17 DIAGNOSIS — R9431 Abnormal electrocardiogram [ECG] [EKG]: Secondary | ICD-10-CM | POA: Diagnosis not present

## 2017-10-17 DIAGNOSIS — R402441 Other coma, without documented Glasgow coma scale score, or with partial score reported, in the field [EMT or ambulance]: Secondary | ICD-10-CM | POA: Diagnosis not present

## 2017-10-17 DIAGNOSIS — G20 Parkinson's disease: Secondary | ICD-10-CM | POA: Diagnosis not present

## 2017-10-17 DIAGNOSIS — R4182 Altered mental status, unspecified: Secondary | ICD-10-CM | POA: Diagnosis not present

## 2017-10-17 LAB — COMPREHENSIVE METABOLIC PANEL
ALT: 20 U/L (ref 17–63)
AST: 30 U/L (ref 15–41)
Albumin: 3.6 g/dL (ref 3.5–5.0)
Alkaline Phosphatase: 109 U/L (ref 38–126)
Anion gap: 3 — ABNORMAL LOW (ref 5–15)
BUN: 13 mg/dL (ref 6–20)
CHLORIDE: 107 mmol/L (ref 101–111)
CO2: 26 mmol/L (ref 22–32)
CREATININE: 1.16 mg/dL (ref 0.61–1.24)
Calcium: 9.3 mg/dL (ref 8.9–10.3)
GFR calc Af Amer: >60 mL/min (ref >60)
GFR calc non Af Amer: 56 mL/min — ABNORMAL LOW (ref >60)
Glucose, Bld: 87 mg/dL (ref 65–99)
Potassium: 4.4 mmol/L (ref 3.5–5.1)
SODIUM: 136 mmol/L (ref 135–145)
Total Bilirubin: 0.8 mg/dL (ref 0.3–1.2)
Total Protein: 6.7 g/dL (ref 6.5–8.1)

## 2017-10-17 LAB — CBC WITH DIFFERENTIAL/PLATELET
BASOS ABS: 0.1 10*3/uL (ref 0.0–0.1)
Basophils Relative: 1 %
EOS ABS: 0.2 10*3/uL (ref 0.0–0.7)
EOS PCT: 4 %
HCT: 40.3 % (ref 39.0–52.0)
Hemoglobin: 12.9 g/dL — ABNORMAL LOW (ref 13.0–17.0)
LYMPHS PCT: 27 %
Lymphs Abs: 1.5 10*3/uL (ref 0.7–4.0)
MCH: 29.2 pg (ref 26.0–34.0)
MCHC: 32 g/dL (ref 30.0–36.0)
MCV: 91.2 fL (ref 78.0–100.0)
Monocytes Absolute: 0.5 10*3/uL (ref 0.1–1.0)
Monocytes Relative: 8 %
Neutro Abs: 3.3 10*3/uL (ref 1.7–7.7)
Neutrophils Relative %: 60 %
PLATELETS: 196 10*3/uL (ref 150–400)
RBC: 4.42 MIL/uL (ref 4.22–5.81)
RDW: 15 % (ref 11.5–15.5)
WBC: 5.4 10*3/uL (ref 4.0–10.5)

## 2017-10-17 LAB — URINALYSIS, ROUTINE W REFLEX MICROSCOPIC
Bilirubin Urine: NEGATIVE
GLUCOSE, UA: NEGATIVE mg/dL
KETONES UR: NEGATIVE mg/dL
NITRITE: POSITIVE — AB
PH: 6 (ref 5.0–8.0)
PROTEIN: NEGATIVE mg/dL
Specific Gravity, Urine: 1.006 (ref 1.005–1.030)
Squamous Epithelial / LPF: NONE SEEN

## 2017-10-17 LAB — I-STAT TROPONIN, ED: Troponin i, poc: 0 ng/mL (ref 0.00–0.08)

## 2017-10-17 LAB — I-STAT CG4 LACTIC ACID, ED: Lactic Acid, Venous: 0.93 mmol/L (ref 0.5–1.9)

## 2017-10-17 MED ORDER — CEPHALEXIN 500 MG PO CAPS: 500.0000 mg | ORAL_CAPSULE | Freq: Two times a day (BID) | ORAL | 0 refills | Status: DC

## 2017-10-17 MED ORDER — CEPHALEXIN 250 MG PO CAPS
500.0000 mg | ORAL_CAPSULE | Freq: Once | ORAL | Status: AC
  Administered 2017-10-17: 500 mg via ORAL
  Filled 2017-10-17: qty 2

## 2017-10-17 NOTE — ED Notes (Signed)
Pt aware of need for urine specimen. Urinal at bedside

## 2017-10-17 NOTE — ED Triage Notes (Signed)
Pt arrived from [facility] where staff report AMS, with LSN at 11 am today. Staff reports that pt ate breakfast this morning like normal. Pt leans to left and drools at baseline per staff, hx of dementia. AMS symptoms are not communicating. Pt recently finished [facility] on [facility] for UTI.

## 2017-10-17 NOTE — ED Provider Notes (Addendum)
[facility] EMERGENCY DEPARTMENT Provider Note   CSN: [id number] Arrival date & time: 10/17/17  1153     History   Chief Complaint Chief Complaint  Patient presents with  . Altered Mental Status    HPI Omarr Hann is a 83 y.o. male.  Patient is an 83 year old male with a history of hypothyroidism, Parkinson's disease, memory problems who lives at a skilled facility presenting today with difficulty to arouse.  They state today is when he became unresponsive.  He recently was treated for urinary tract infection and has been on Bactrim.  They deny any recent medication changes.   The history is provided by the EMS personnel and the nursing home. The history is limited by the condition of the patient and the absence of a caregiver.  Altered Mental Status   This is a new problem. Associated symptoms include somnolence and unresponsiveness.    Past Medical History:  Diagnosis Date  . Hypothyroidism   . Memory deficits 10/22/2013  . Parkinson disease (HCC)   . 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 Generalized weakness 05/28/2017  . Indwelling Foley catheter present   . Rectal bleeding   . GI bleed 05/18/2017  . Pressure injury of skin 04/01/2017  . PNA (pneumonia) 03/31/2017  . Chronic insomnia 08/12/2016  . 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 Memory deficits 10/22/2013  . Abnormality of gait 11/10/2012  . Paralysis agitans (HCC) 11/10/2012    Past Surgical History:  Procedure Laterality Date  . TONSILLECTOMY         Home Medications    Prior to Admission medications   Medication Sig Start Date End Date Taking? Authorizing Provider  acetaminophen (TYLENOL) 325 MG tablet Take 650 mg by mouth every 6 (six) hours as needed for mild pain, moderate pain, fever or headache.     [provider]  alum & mag hydroxide-simeth (MINTOX) 200-200-20 MG/5ML suspension Take 30  mLs by mouth every 6 (six) hours as needed for indigestion or heartburn.    [provider]  carbidopa-levodopa (SINEMET IR) 25-100 MG tablet Take 1 tablet by mouth 3 (three) times daily. 12/30/16   York Spaniel, MD  guaifenesin (ROBAFEN) 100 MG/5ML syrup Take 200 mg by mouth 3 (three) times daily as needed for cough.    [provider]  levothyroxine (SYNTHROID, LEVOTHROID) 88 MCG tablet Take 88 mcg by mouth daily before breakfast.     [provider]  loperamide (IMODIUM A-D) 2 MG tablet Take 2 mg by mouth 4 (four) times daily as needed for diarrhea or loose stools.    [provider]  Multiple Vitamin (MULTIVITAMIN WITH MINERALS) TABS tablet Take 1 tablet by mouth daily.    [provider]  polyethylene glycol (MIRALAX / GLYCOLAX) packet Take 17 g by mouth daily.     [provider]  ropinirole (REQUIP) 5 MG tablet Take 5 mg by mouth daily.    [provider]  saccharomyces boulardii (FLORASTOR) 250 MG capsule Take 250 mg by mouth daily.    [provider]  traZODone (DESYREL) 100 MG tablet Take 1 tablet (100 mg total) by mouth at bedtime. 12/30/16   York Spaniel, MD  vitamin C (ASCORBIC ACID) 250 MG tablet Take 250 mg by mouth daily.    [provider]  zinc sulfate 220 (50 Zn) MG capsule Take 220 mg by mouth daily.  [provider]    Family History Family History  Problem Relation Age of Onset  . Diabetes Brother     Social History Social History   Tobacco Use  . Smoking status: Never Smoker  . Smokeless tobacco: Never Used  Substance Use Topics  . Alcohol use: No  . Drug use: No     Allergies   Patient has no known allergies.   Review of Systems Review of Systems  All other systems reviewed and are negative.    Physical Exam Updated Vital Signs BP 117/69 (BP Location: Right Arm)   Pulse 63   Temp 97.9 F (36.6 C) (Oral)   Resp 17   Ht 5\' 3"  (1.6 m)   Wt 62.6 kg  (138 lb)   SpO2 100%   BMI 24.45 kg/m   Physical Exam  Constitutional: He appears well-developed and well-nourished. He appears lethargic. No distress.  Somnolent and unable to arouse  HENT:  Head: Normocephalic and atraumatic.  Mouth/Throat: Oropharynx is clear and moist.  Eyes: Conjunctivae and EOM are normal. Pupils are equal, round, and reactive to light.  3 mm and reactive bilaterally  Neck: Normal range of motion. Neck supple.  Cardiovascular: Normal rate, regular rhythm and intact distal pulses.  No murmur heard. Pulmonary/Chest: Effort normal and breath sounds normal. No respiratory distress. He has no wheezes. He has no rales.  Abdominal: Soft. He exhibits no distension. There is no tenderness. There is no rebound and no guarding.  Musculoskeletal: Normal range of motion. He exhibits no edema or tenderness.  Neurological: He appears lethargic.  Some spontaneous movement of the limbs but patient will not wake up or follow commands.  Skin: Skin is warm and dry. No rash noted. No erythema.  Nursing note and vitals reviewed.    ED Treatments / Results  Labs (all labs ordered are listed, but only abnormal results are displayed) Labs Reviewed  CBC WITH DIFFERENTIAL/PLATELET - Abnormal; Notable for the following components:      Result Value   Hemoglobin 12.9 (*)    All other components within normal limits  COMPREHENSIVE METABOLIC PANEL - Abnormal; Notable for the following components:   GFR calc non Af Amer 56 (*)    Anion gap 3 (*)    All other components within normal limits  URINALYSIS, ROUTINE W REFLEX MICROSCOPIC - Abnormal; Notable for the following components:   APPearance CLOUDY (*)    Hgb urine dipstick SMALL (*)    Nitrite POSITIVE (*)    Leukocytes, UA LARGE (*)    Bacteria, UA MANY (*)    All other components within normal limits  URINE CULTURE  I-STAT TROPONIN, ED  I-STAT CG4 LACTIC ACID, ED    EKG  EKG Interpretation  Date/Time:  Friday October 17 2017 11:58:11 EST Ventricular Rate:  64 PR Interval:    QRS Duration: 90 QT Interval:  412 QTC Calculation: 425 R Axis:   -9 Text Interpretation:  Sinus rhythm Possible Anterior infarct , age undetermined No significant change since last tracing Confirmed by Gwyneth SproutPlunkett, Kidus Delman ([id number]) on 10/17/2017 12:08:46 PM       Radiology Ct Head Wo Contrast  Result Date: 10/17/2017 CLINICAL DATA:  Altered level of consciousness. History of dementia. EXAM: CT HEAD WITHOUT CONTRAST TECHNIQUE: Contiguous axial images were obtained from the base of the skull through the vertex without intravenous contrast. COMPARISON:  Head CT dated 03/31/2017. FINDINGS: Brain: Generalized age related parenchymal atrophy with commensurate dilatation of the ventricles and sulci.  No mass, hemorrhage, edema or other evidence of acute parenchymal abnormality. No extra-axial hemorrhage. Vascular: There are chronic calcified atherosclerotic changes of the large vessels at the skull base. No unexpected hyperdense vessel. Skull: Normal. Negative for fracture or focal lesion. Sinuses/Orbits: No acute finding. Other: None. IMPRESSION: 1. No acute findings.  No intracranial mass, hemorrhage or edema. 2. Atrophy. Electronically Signed   By: Bary RichardStan  Maynard M.D.   On: 10/17/2017 14:25   Dg Chest Port 1 View  Result Date: 10/17/2017 CLINICAL DATA:  Altered mental status EXAM: PORTABLE CHEST 1 VIEW COMPARISON:  08/30/2017 and prior chest radiographs FINDINGS: This is a low volume film. Cardiomegaly and mild bilateral lower lung subsegmental atelectasis/scarring again noted. There is no evidence of focal airspace disease, pulmonary edema, suspicious pulmonary nodule/mass, pleural effusion, or pneumothorax. No acute bony abnormalities are identified. IMPRESSION: No acute abnormality. Cardiomegaly and unchanged mild bilateral lower lung atelectasis/scarring Electronically Signed   By: Harmon PierJeffrey  Hu M.D.   On: 10/17/2017 13:05     Procedures Procedures (including critical care time)  Medications Ordered in ED Medications - No data to display   Initial Impression / Assessment and Plan / ED Course  I have reviewed the triage vital signs and the nursing notes.  Pertinent labs & imaging results that were available during my care of the patient were reviewed by me and considered in my medical decision making (see chart for details).     Patient is an elderly gentleman with multiple medical problems presenting today with altered mental status.  Patient is somnolent and unresponsive.  He is currently protecting his airway but will not wake up or follow commands.  Vital signs are within normal limits.  Patient does take trazodone but takes no other mind altering medications.  Concern because patient was recently treated for urinary tract infection with altered mental status concern for sepsis, worsening infection, stroke, intracranial hemorrhage versus medication reaction. CBC, CMP, troponin, EKG, head CT, chest x-ray, rectal temperature, lactate pending.  1:06 PM On returning to evaluate the patient he is now awake, alert, reactive, speaking with his wife.  Following all commands.  She states that she is concerned he may be dehydrated from his recent infection and sometimes he will do things like this.  4:38 PM Labs and imaging reassuring and urine pending.   4:57 PM UA with evidence of UTI with too numerous to count white blood cells, too numerous to count red cells, many bacteria positive nitrites and leukocytes.  Patient had been on Bactrim but does not appear to be helping.  Patient switched to Keflex and urine culture sent.  Findings discussed with the patient and his wife and he will follow-up with his urologist.  Final Clinical Impressions(s) / ED Diagnoses   Final diagnoses:  Lower urinary tract infectious disease    ED Discharge Orders    None       Gwyneth SproutPlunkett, Merrin Mcvicker, MD 10/17/17 1639     Gwyneth SproutPlunkett, Sammye Staff, MD 10/17/17 1700

## 2017-10-19 LAB — URINE CULTURE: Culture: >=100000 — AB

## 2017-10-20 ENCOUNTER — Telehealth: Payer: Self-pay | Admitting: Emergency Medicine

## 2017-10-20 NOTE — Telephone Encounter (Signed)
Post ED Visit - Positive Culture Follow-up: Successful Patient Follow-Up  Culture assessed and recommendations reviewed by: []  Enzo BiNathan Batchelder, Pharm.D. []  Celedonio MiyamotoJeremy Frens, Pharm.D., BCPS AQ-ID []  Garvin FilaMike Maccia, Pharm.D., BCPS []  Georgina PillionElizabeth Martin, Pharm.D., BCPS []  SimsMinh Pham, [address] BoulevardPharm.D., BCPS, AAHIVP []  Estella HuskMichelle Turner, Pharm.D., BCPS, AAHIVP []  Lysle Pearlachel Rumbarger, PharmD, BCPS []  Casilda Carlsaylor Stone, PharmD, BCPS []  Pollyann SamplesAndy Johnston, PharmD, BCPS  Positive urine culture  []  Patient discharged without antimicrobial prescription and treatment is now indicated [x]  Organism is resistant to prescribed ED discharge antimicrobial []  Patient with positive blood cultures  Changes discussed with ED provider: Lyndel SafeElizabeth Hammond PA New antibiotic prescription stop Keflex, start Cipro 500mg  q 12 hours x 7 days New order faxed to [phone] [facility]  Contacted [facility] @  [phone]   Berle MullMiller, Tamakia Porto 10/20/2017, 12:10 PM

## 2017-10-20 NOTE — Progress Notes (Signed)
ED Antimicrobial Stewardship Positive Culture Follow Up   Craig Giles is an 83 y.o. male [facility] with a chief complaint of  Chief Complaint  Patient presents with  . Altered Mental Status    Recent Results (from the past 720 hour(s))  Urine Culture     Status: Abnormal   Collection Time: 10/17/17  5:20 PM  Result Value Ref Range Status   Specimen Description URINE, CATHETERIZED  Final   Special Requests NONE  Final   Culture >=100,000 COLONIES/mL PSEUDOMONAS AERUGINOSA (A)  Final   Report Status 10/19/2017 FINAL  Final   Organism ID, Bacteria PSEUDOMONAS AERUGINOSA (A)  Final      Susceptibility   Pseudomonas aeruginosa - MIC*    CEFTAZIDIME 4 SENSITIVE Sensitive     CIPROFLOXACIN 1 SENSITIVE Sensitive     GENTAMICIN <=1 SENSITIVE Sensitive     IMIPENEM 2 SENSITIVE Sensitive     PIP/TAZO 16 SENSITIVE Sensitive     CEFEPIME 4 SENSITIVE Sensitive     * >=100,000 COLONIES/mL PSEUDOMONAS AERUGINOSA    [x]  Treated with cephalexin, organism resistant to prescribed antimicrobial  Patient with history of pseudomonas in the urine. Most recent QTc 425 ms, ok to start cipro.  New antibiotic prescription: Stop cephalexin. Start ciprofloxacin 500 mg PO q12h x 7 days  ED Provider: Lyndel SafeElizabeth Hammond, PA    Kathyrn SheriffLindsey N Javad Salva, PharmD PGY1 Pharmacy Resident ID pharmacist phone # [phone] 10/20/17 9:25 AM

## 2017-10-28 DIAGNOSIS — N138 Other obstructive and reflux uropathy: Secondary | ICD-10-CM | POA: Diagnosis not present

## 2017-10-28 DIAGNOSIS — R339 Retention of urine, unspecified: Secondary | ICD-10-CM | POA: Diagnosis not present

## 2017-10-28 DIAGNOSIS — N3020 Other chronic cystitis without hematuria: Secondary | ICD-10-CM | POA: Diagnosis not present

## 2017-10-29 DIAGNOSIS — G2581 Restless legs syndrome: Secondary | ICD-10-CM | POA: Diagnosis not present

## 2017-10-29 DIAGNOSIS — F5104 Psychophysiologic insomnia: Secondary | ICD-10-CM | POA: Diagnosis not present

## 2017-10-29 DIAGNOSIS — N390 Urinary tract infection, site not specified: Secondary | ICD-10-CM | POA: Diagnosis not present

## 2017-10-29 DIAGNOSIS — N182 Chronic kidney disease, stage 2 (mild): Secondary | ICD-10-CM | POA: Diagnosis not present

## 2017-10-29 DIAGNOSIS — H9190 Unspecified hearing loss, unspecified ear: Secondary | ICD-10-CM | POA: Diagnosis not present

## 2017-10-29 DIAGNOSIS — K5900 Constipation, unspecified: Secondary | ICD-10-CM | POA: Diagnosis not present

## 2017-10-29 DIAGNOSIS — N401 Enlarged prostate with lower urinary tract symptoms: Secondary | ICD-10-CM | POA: Diagnosis not present

## 2017-10-29 DIAGNOSIS — N3941 Urge incontinence: Secondary | ICD-10-CM | POA: Diagnosis not present

## 2017-10-29 DIAGNOSIS — E039 Hypothyroidism, unspecified: Secondary | ICD-10-CM | POA: Diagnosis not present

## 2017-10-29 DIAGNOSIS — F0280 Dementia in other diseases classified elsewhere without behavioral disturbance: Secondary | ICD-10-CM | POA: Diagnosis not present

## 2017-10-29 DIAGNOSIS — R1311 Dysphagia, oral phase: Secondary | ICD-10-CM | POA: Diagnosis not present

## 2017-10-29 DIAGNOSIS — D631 Anemia in chronic kidney disease: Secondary | ICD-10-CM | POA: Diagnosis not present

## 2017-10-29 DIAGNOSIS — G20 Parkinson's disease: Secondary | ICD-10-CM | POA: Diagnosis not present

## 2017-11-18 DIAGNOSIS — G20 Parkinson's disease: Secondary | ICD-10-CM | POA: Diagnosis not present

## 2017-11-18 DIAGNOSIS — Z79899 Other long term (current) drug therapy: Secondary | ICD-10-CM | POA: Diagnosis not present

## 2017-11-18 DIAGNOSIS — R600 Localized edema: Secondary | ICD-10-CM | POA: Diagnosis not present

## 2017-11-18 DIAGNOSIS — R0602 Shortness of breath: Secondary | ICD-10-CM | POA: Diagnosis not present

## 2017-11-18 DIAGNOSIS — I5089 Other heart failure: Secondary | ICD-10-CM | POA: Diagnosis not present

## 2017-11-26 DIAGNOSIS — I509 Heart failure, unspecified: Secondary | ICD-10-CM | POA: Diagnosis not present

## 2017-12-09 DIAGNOSIS — R600 Localized edema: Secondary | ICD-10-CM | POA: Diagnosis not present

## 2017-12-09 DIAGNOSIS — G20 Parkinson's disease: Secondary | ICD-10-CM | POA: Diagnosis not present

## 2017-12-09 DIAGNOSIS — I10 Essential (primary) hypertension: Secondary | ICD-10-CM | POA: Diagnosis not present

## 2017-12-17 DIAGNOSIS — F418 Other specified anxiety disorders: Secondary | ICD-10-CM | POA: Diagnosis not present

## 2017-12-17 DIAGNOSIS — G20 Parkinson's disease: Secondary | ICD-10-CM | POA: Diagnosis not present

## 2017-12-17 DIAGNOSIS — R451 Restlessness and agitation: Secondary | ICD-10-CM | POA: Diagnosis not present

## 2017-12-17 DIAGNOSIS — N400 Enlarged prostate without lower urinary tract symptoms: Secondary | ICD-10-CM | POA: Diagnosis not present

## 2017-12-18 DIAGNOSIS — R300 Dysuria: Secondary | ICD-10-CM | POA: Diagnosis not present

## 2017-12-30 DIAGNOSIS — E038 Other specified hypothyroidism: Secondary | ICD-10-CM | POA: Diagnosis not present

## 2017-12-30 DIAGNOSIS — R2689 Other abnormalities of gait and mobility: Secondary | ICD-10-CM | POA: Diagnosis not present

## 2017-12-30 DIAGNOSIS — M6281 Muscle weakness (generalized): Secondary | ICD-10-CM | POA: Diagnosis not present

## 2017-12-30 DIAGNOSIS — G20 Parkinson's disease: Secondary | ICD-10-CM | POA: Diagnosis not present

## 2017-12-31 ENCOUNTER — Emergency Department (HOSPITAL_COMMUNITY): Payer: Medicare Other

## 2017-12-31 ENCOUNTER — Inpatient Hospital Stay (HOSPITAL_COMMUNITY)
Admission: EM | Admit: 2017-12-31 | Discharge: 2018-01-03 | DRG: 871 | Disposition: A | Discharge status: Nursing Home (Includes ICF) | Payer: Medicare Other | Attending: Family Medicine | Admitting: Family Medicine

## 2017-12-31 ENCOUNTER — Other Ambulatory Visit: Payer: Self-pay

## 2017-12-31 ENCOUNTER — Encounter (HOSPITAL_COMMUNITY): Payer: Self-pay

## 2017-12-31 DIAGNOSIS — R413 Other amnesia: Secondary | ICD-10-CM | POA: Diagnosis present

## 2017-12-31 DIAGNOSIS — R402441 Other coma, without documented Glasgow coma scale score, or with partial score reported, in the field [EMT or ambulance]: Secondary | ICD-10-CM | POA: Diagnosis not present

## 2017-12-31 DIAGNOSIS — G20 Parkinson's disease: Secondary | ICD-10-CM | POA: Diagnosis not present

## 2017-12-31 DIAGNOSIS — F0280 Dementia in other diseases classified elsewhere without behavioral disturbance: Secondary | ICD-10-CM | POA: Diagnosis not present

## 2017-12-31 DIAGNOSIS — J9811 Atelectasis: Secondary | ICD-10-CM | POA: Diagnosis not present

## 2017-12-31 DIAGNOSIS — A419 Sepsis, unspecified organism: Principal | ICD-10-CM | POA: Diagnosis present

## 2017-12-31 DIAGNOSIS — E039 Hypothyroidism, unspecified: Secondary | ICD-10-CM | POA: Diagnosis not present

## 2017-12-31 DIAGNOSIS — J189 Pneumonia, unspecified organism: Secondary | ICD-10-CM | POA: Diagnosis not present

## 2017-12-31 DIAGNOSIS — J690 Pneumonitis due to inhalation of food and vomit: Secondary | ICD-10-CM | POA: Diagnosis present

## 2017-12-31 DIAGNOSIS — Z79899 Other long term (current) drug therapy: Secondary | ICD-10-CM

## 2017-12-31 DIAGNOSIS — G20A1 Parkinson's disease without dyskinesia, without mention of fluctuations: Secondary | ICD-10-CM | POA: Diagnosis present

## 2017-12-31 DIAGNOSIS — G479 Sleep disorder, unspecified: Secondary | ICD-10-CM | POA: Diagnosis not present

## 2017-12-31 DIAGNOSIS — R339 Retention of urine, unspecified: Secondary | ICD-10-CM | POA: Diagnosis not present

## 2017-12-31 DIAGNOSIS — I9589 Other hypotension: Secondary | ICD-10-CM | POA: Diagnosis not present

## 2017-12-31 DIAGNOSIS — F0281 Dementia in other diseases classified elsewhere with behavioral disturbance: Secondary | ICD-10-CM | POA: Diagnosis present

## 2017-12-31 DIAGNOSIS — R4182 Altered mental status, unspecified: Secondary | ICD-10-CM | POA: Diagnosis not present

## 2017-12-31 DIAGNOSIS — R0902 Hypoxemia: Secondary | ICD-10-CM | POA: Diagnosis not present

## 2017-12-31 DIAGNOSIS — G4752 REM sleep behavior disorder: Secondary | ICD-10-CM | POA: Diagnosis present

## 2017-12-31 DIAGNOSIS — Z781 Physical restraint status: Secondary | ICD-10-CM | POA: Diagnosis not present

## 2017-12-31 DIAGNOSIS — R079 Chest pain, unspecified: Secondary | ICD-10-CM | POA: Diagnosis not present

## 2017-12-31 HISTORY — DX: Pneumonia, unspecified organism: J18.9

## 2017-12-31 LAB — COMPREHENSIVE METABOLIC PANEL
ALBUMIN: 3.6 g/dL (ref 3.5–5.0)
ALK PHOS: 100 U/L (ref 38–126)
ALT: 6 U/L — AB (ref 17–63)
ANION GAP: 11 (ref 5–15)
AST: 33 U/L (ref 15–41)
BILIRUBIN TOTAL: 1.7 mg/dL — AB (ref 0.3–1.2)
BUN: 25 mg/dL — ABNORMAL HIGH (ref 6–20)
CALCIUM: 9.3 mg/dL (ref 8.9–10.3)
CO2: 22 mmol/L (ref 22–32)
CREATININE: 1.39 mg/dL — AB (ref 0.61–1.24)
Chloride: 105 mmol/L (ref 101–111)
GFR calc non Af Amer: 45 mL/min — ABNORMAL LOW (ref >60)
GFR, EST AFRICAN AMERICAN: 52 mL/min — AB (ref >60)
GLUCOSE: 105 mg/dL — AB (ref 65–99)
Potassium: 4.6 mmol/L (ref 3.5–5.1)
Sodium: 138 mmol/L (ref 135–145)
TOTAL PROTEIN: 6.7 g/dL (ref 6.5–8.1)

## 2017-12-31 LAB — URINALYSIS, ROUTINE W REFLEX MICROSCOPIC
Bilirubin Urine: NEGATIVE
GLUCOSE, UA: NEGATIVE mg/dL
Ketones, ur: 5 mg/dL — AB
NITRITE: NEGATIVE
PH: 5 (ref 5.0–8.0)
Protein, ur: NEGATIVE mg/dL
SPECIFIC GRAVITY, URINE: 1.023 (ref 1.005–1.030)
Squamous Epithelial / LPF: NONE SEEN

## 2017-12-31 LAB — CBC WITH DIFFERENTIAL/PLATELET
BASOS ABS: 0 10*3/uL (ref 0.0–0.1)
BASOS PCT: 0 %
EOS ABS: 0.3 10*3/uL (ref 0.0–0.7)
Eosinophils Relative: 3 %
HEMATOCRIT: 39.7 % (ref 39.0–52.0)
Hemoglobin: 12.7 g/dL — ABNORMAL LOW (ref 13.0–17.0)
Lymphocytes Relative: 10 %
Lymphs Abs: 1.1 10*3/uL (ref 0.7–4.0)
MCH: 29.3 pg (ref 26.0–34.0)
MCHC: 32 g/dL (ref 30.0–36.0)
MCV: 91.5 fL (ref 78.0–100.0)
Monocytes Absolute: 0.6 10*3/uL (ref 0.1–1.0)
Monocytes Relative: 6 %
NEUTROS ABS: 9.6 10*3/uL — AB (ref 1.7–7.7)
Neutrophils Relative %: 81 %
PLATELETS: 165 10*3/uL (ref 150–400)
RBC: 4.34 MIL/uL (ref 4.22–5.81)
RDW: 15 % (ref 11.5–15.5)
WBC: 11.7 10*3/uL — ABNORMAL HIGH (ref 4.0–10.5)

## 2017-12-31 LAB — CBC
HCT: 37.2 % — ABNORMAL LOW (ref 39.0–52.0)
Hemoglobin: 11.9 g/dL — ABNORMAL LOW (ref 13.0–17.0)
MCH: 29.1 pg (ref 26.0–34.0)
MCHC: 32 g/dL (ref 30.0–36.0)
MCV: 91 fL (ref 78.0–100.0)
Platelets: 160 10*3/uL (ref 150–400)
RBC: 4.09 MIL/uL — AB (ref 4.22–5.81)
RDW: 14.7 % (ref 11.5–15.5)
WBC: 10.5 10*3/uL (ref 4.0–10.5)

## 2017-12-31 LAB — I-STAT CG4 LACTIC ACID, ED: LACTIC ACID, VENOUS: 1.32 mmol/L (ref 0.5–1.9)

## 2017-12-31 LAB — PROCALCITONIN: Procalcitonin: 0.28 ng/mL

## 2017-12-31 LAB — I-STAT TROPONIN, ED: Troponin i, poc: 0.01 ng/mL (ref 0.00–0.08)

## 2017-12-31 LAB — CREATININE, SERUM
CREATININE: 1.27 mg/dL — AB (ref 0.61–1.24)
GFR calc non Af Amer: 50 mL/min — ABNORMAL LOW (ref >60)
GFR, EST AFRICAN AMERICAN: 58 mL/min — AB (ref >60)

## 2017-12-31 LAB — PROTIME-INR
INR: 1.16
Prothrombin Time: 14.7 seconds (ref 11.4–15.2)

## 2017-12-31 LAB — INFLUENZA PANEL BY PCR (TYPE A & B)
INFLAPCR: NEGATIVE
INFLBPCR: NEGATIVE

## 2017-12-31 LAB — APTT: APTT: 34 s (ref 24–36)

## 2017-12-31 MED ORDER — ONDANSETRON HCL 4 MG/2ML IJ SOLN: 4.0000 mg | Freq: Four times a day (QID) | INTRAMUSCULAR | Status: DC | PRN

## 2017-12-31 MED ORDER — LEVOTHYROXINE SODIUM 88 MCG PO TABS
88.0000 ug | ORAL_TABLET | Freq: Every day | ORAL | Status: DC
  Administered 2018-01-02 – 2018-01-03 (×2): 88 ug via ORAL
  Filled 2017-12-31 (×2): qty 1

## 2017-12-31 MED ORDER — METHYLPREDNISOLONE SODIUM SUCC 125 MG IJ SOLR
80.0000 mg | Freq: Every day | INTRAMUSCULAR | Status: DC
  Administered 2017-12-31 – 2018-01-01 (×2): 80 mg via INTRAVENOUS
  Filled 2017-12-31 (×2): qty 2

## 2017-12-31 MED ORDER — ONDANSETRON HCL 4 MG PO TABS: 4.0000 mg | ORAL_TABLET | Freq: Four times a day (QID) | ORAL | Status: DC | PRN

## 2017-12-31 MED ORDER — DERMACLOUD EX CREA: 1.0000 "application " | TOPICAL_CREAM | Freq: Three times a day (TID) | CUTANEOUS | Status: DC

## 2017-12-31 MED ORDER — DEXTROSE 5 % IV SOLN: 1.0000 g | INTRAVENOUS | Status: DC

## 2017-12-31 MED ORDER — ROPINIROLE HCL 1 MG PO TABS
5.0000 mg | ORAL_TABLET | Freq: Every day | ORAL | Status: DC
  Administered 2018-01-01 – 2018-01-02 (×2): 5 mg via ORAL
  Filled 2017-12-31 (×2): qty 5

## 2017-12-31 MED ORDER — LACTATED RINGERS IV SOLN
INTRAVENOUS | Status: DC
Start: 2017-12-31 — End: 2018-01-03
  Administered 2017-12-31 – 2018-01-01 (×2): via INTRAVENOUS

## 2017-12-31 MED ORDER — SACCHAROMYCES BOULARDII 250 MG PO CAPS
250.0000 mg | ORAL_CAPSULE | Freq: Two times a day (BID) | ORAL | Status: DC
  Administered 2018-01-01 – 2018-01-03 (×5): 250 mg via ORAL
  Filled 2017-12-31 (×5): qty 1

## 2017-12-31 MED ORDER — DEXTROSE 5 % IV SOLN
1.0000 g | Freq: Once | INTRAVENOUS | Status: AC
  Administered 2017-12-31: 1 g via INTRAVENOUS
  Filled 2017-12-31: qty 10

## 2017-12-31 MED ORDER — FINASTERIDE 5 MG PO TABS
5.0000 mg | ORAL_TABLET | Freq: Every day | ORAL | Status: DC
  Administered 2018-01-01 – 2018-01-03 (×3): 5 mg via ORAL
  Filled 2017-12-31 (×3): qty 1

## 2017-12-31 MED ORDER — IPRATROPIUM-ALBUTEROL 0.5-2.5 (3) MG/3ML IN SOLN
3.0000 mL | Freq: Four times a day (QID) | RESPIRATORY_TRACT | Status: DC
  Administered 2017-12-31 – 2018-01-01 (×2): 3 mL via RESPIRATORY_TRACT
  Filled 2017-12-31 (×2): qty 3

## 2017-12-31 MED ORDER — MEDICAL COMPRESSION STOCKINGS MISC: 1.0000 | Freq: Every day | Status: DC

## 2017-12-31 MED ORDER — VANCOMYCIN HCL IN DEXTROSE 1-5 GM/200ML-% IV SOLN
1000.0000 mg | INTRAVENOUS | Status: DC
  Administered 2017-12-31 – 2018-01-01 (×2): 1000 mg via INTRAVENOUS
  Filled 2017-12-31 (×2): qty 200

## 2017-12-31 MED ORDER — ENOXAPARIN SODIUM 40 MG/0.4ML ~~LOC~~ SOLN
40.0000 mg | SUBCUTANEOUS | Status: DC
  Administered 2018-01-01: 40 mg via SUBCUTANEOUS
  Filled 2017-12-31 (×2): qty 0.4

## 2017-12-31 MED ORDER — GUAIFENESIN 100 MG/5ML PO SYRP: 200.0000 mg | ORAL_SOLUTION | Freq: Three times a day (TID) | ORAL | Status: DC | PRN

## 2017-12-31 MED ORDER — ACETAMINOPHEN 325 MG PO TABS: 650.0000 mg | ORAL_TABLET | Freq: Four times a day (QID) | ORAL | Status: DC | PRN

## 2017-12-31 MED ORDER — TAMSULOSIN HCL 0.4 MG PO CAPS
0.4000 mg | ORAL_CAPSULE | Freq: Every day | ORAL | Status: DC
  Administered 2018-01-01 – 2018-01-03 (×3): 0.4 mg via ORAL
  Filled 2017-12-31 (×3): qty 1

## 2017-12-31 MED ORDER — TRAZODONE HCL 100 MG PO TABS: 100.0000 mg | ORAL_TABLET | Freq: Every day | ORAL | Status: DC

## 2017-12-31 MED ORDER — TRAMADOL HCL 50 MG PO TABS: 50.0000 mg | ORAL_TABLET | Freq: Four times a day (QID) | ORAL | Status: DC | PRN

## 2017-12-31 MED ORDER — CARBIDOPA-LEVODOPA 25-100 MG PO TABS
1.0000 | ORAL_TABLET | Freq: Three times a day (TID) | ORAL | Status: DC
  Administered 2018-01-01 – 2018-01-03 (×6): 1 via ORAL
  Filled 2017-12-31 (×9): qty 1

## 2017-12-31 MED ORDER — DEXTROSE 5 % IV SOLN
500.0000 mg | Freq: Once | INTRAVENOUS | Status: AC
  Administered 2017-12-31: 500 mg via INTRAVENOUS
  Filled 2017-12-31: qty 500

## 2017-12-31 MED ORDER — ACETAMINOPHEN 650 MG RE SUPP
650.0000 mg | Freq: Four times a day (QID) | RECTAL | Status: DC | PRN
  Administered 2017-12-31: 650 mg via RECTAL
  Filled 2017-12-31: qty 1

## 2017-12-31 MED ORDER — PIPERACILLIN-TAZOBACTAM 3.375 G IVPB
3.3750 g | Freq: Three times a day (TID) | INTRAVENOUS | Status: DC
  Administered 2017-12-31 – 2018-01-03 (×8): 3.375 g via INTRAVENOUS
  Filled 2017-12-31 (×10): qty 50

## 2017-12-31 MED ORDER — POLYETHYLENE GLYCOL 3350 17 G PO PACK
17.0000 g | PACK | Freq: Every day | ORAL | Status: DC
  Administered 2018-01-01 – 2018-01-03 (×3): 17 g via ORAL
  Filled 2017-12-31 (×4): qty 1

## 2017-12-31 MED ORDER — SODIUM CHLORIDE 0.9 % IV BOLUS (SEPSIS)
500.0000 mL | Freq: Once | INTRAVENOUS | Status: AC
  Administered 2017-12-31: 500 mL via INTRAVENOUS

## 2017-12-31 MED ORDER — DEXTROSE 5 % IV SOLN: 500.0000 mg | INTRAVENOUS | Status: DC

## 2017-12-31 NOTE — ED Notes (Signed)
pts oxygen levels are 86% on RA, edp at bedside and aware. Placed on 2L [place] and levels are now 98%, resting quietly in bed.

## 2017-12-31 NOTE — H&P (Signed)
History and Physical    Craig Giles ZOX:[id number] DOB: [birth year] DOA: 12/31/2017  PCP: Craig Ora, MD  Patient coming from: SNF  Chief Complaint: Altered mental status, SOB  HPI: Craig Giles is a 84 y.o. male, resident of SNF with medical history significant of Parkinson's disease, dementia, CKD, hypothyroidism, sleep disorder who was found today in the facility more confused than usual and SOB. His VS revealed SpO2 of 85 %, patient was given supplemental O2 and transferred to the ED for further assessment.  ED Course: up with arrival to the ED vital signs demonstrate a temperature of 99.66F, respiration 20, pressure 91/60 mmHg, pulse oximetry 98% on 2 L O2 Blood work demonstrated a mild leukocytosis - 11,700, creatinine 1.39, BMI 25 Portable chest x-ray demonstrated bilateral atelectasis and infiltrates left worse than the right and left sided basilar pneumonia  Patient was given IV fluids and started on IV antibiotics - Zithromax and Rocephin   Review of Systems: patient is confused at baseline and now is lethargic unable to provide review of systems  Ambulatory Status: Limited ambulation with assistance  Past Medical History:  Diagnosis Date  . Hypothyroidism   . Memory deficits 10/22/2013  . Parkinson disease (HCC)   . 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 TONSILLECTOMY      Social History   Socioeconomic History  . Marital status: Married    Spouse name: Not on file  . Number of children: 3  . Years of education: HS  . Highest education level: Not on file  Social Needs  . Financial resource strain: Not on file  . Food insecurity - worry: Not on file  . Food insecurity - inability: Not on file  . Transportation needs - medical: Not on file  . Transportation needs - non-medical: Not on file  Occupational History  . Occupation:    Tobacco Use  . Smoking status: Never Smoker  . Smokeless  tobacco: Never Used  Substance and Sexual Activity  . Alcohol use: No  . Drug use: No  . Sexual activity: Not on file  Other Topics Concern  . Not on file  Social History Narrative   Lives w/ wife.   Patient is right handed.   Patient drinks 1 cup of caffeine per day.    No Known Allergies  Family History  Problem Relation Age of Onset  . Diabetes Brother     Prior to Admission medications   Medication Sig Start Date End Date Taking? Authorizing Provider  acetaminophen (TYLENOL) 325 MG tablet Take 650 mg by mouth every 6 (six) hours as needed for mild pain, moderate pain, fever or headache.    Yes [provider]  alum & mag hydroxide-simeth (MINTOX) 200-200-20 MG/5ML suspension Take 30 mLs by mouth every 6 (six) hours as needed for indigestion or heartburn.   Yes [provider]  finasteride (PROSCAR) 5 MG tablet Take 5 mg by mouth daily. 12/17/17  Yes [provider]  guaifenesin (ROBAFEN) 100 MG/5ML syrup Take 200 mg by mouth 3 (three) times daily as needed for cough.   Yes [provider]  Infant Care Products (DERMACLOUD) CREA Apply 1 application topically 3 (three) times daily. Apply to buttocks and perineal area with brief changes   Yes [provider]  levothyroxine (SYNTHROID, LEVOTHROID) 88 MCG tablet Take 88 mcg by mouth daily before breakfast.    Yes [provider]  carbidopa-levodopa (SINEMET IR) 25-100  MG tablet Take 1 tablet by mouth 3 (three) times daily. 12/30/16   Craig Spaniel, MD  cephALEXin (KEFLEX) 500 MG capsule Take 1 capsule (500 mg total) 2 (two) times daily by mouth. 10/17/17   Craig Sprout, MD  loperamide (IMODIUM A-D) 2 MG tablet Take 2 mg by mouth 4 (four) times daily as needed for diarrhea or loose stools.    [provider]  Multiple Vitamin (MULTIVITAMIN WITH MINERALS) TABS tablet Take 1 tablet by mouth daily.    [provider]  polyethylene glycol (MIRALAX / GLYCOLAX)  packet Take 17 g by mouth daily.     [provider]  ropinirole (REQUIP) 5 MG tablet Take 5 mg by mouth daily.    [provider]  saccharomyces boulardii (FLORASTOR) 250 MG capsule Take 250 mg by mouth daily.    [provider]  sulfamethoxazole-trimethoprim (BACTRIM DS,SEPTRA DS) 800-160 MG tablet Take 1 tablet 2 (two) times daily by mouth. Started 10-08-17. Completed on 10-14-17    [provider]  traZODone (DESYREL) 100 MG tablet Take 1 tablet (100 mg total) by mouth at bedtime. 12/30/16   Craig Spaniel, MD  vitamin C (ASCORBIC ACID) 250 MG tablet Take 250 mg by mouth daily.    [provider]  zinc sulfate 220 (50 Zn) MG capsule Take 220 mg by mouth daily.    [provider]    Physical Exam: Vitals:   12/31/17 1445 12/31/17 1500 12/31/17 1530 12/31/17 1545  BP: 105/60 (!) 100/57 (!) 98/54 91/60  Pulse: 64 66 67 64  Resp: (!) 21 20 19 20   Temp:      TempSrc:      SpO2: 99% 98% 99% 98%  Weight:         General: Appears calm and comfortable Eyes: PERRLA, EOMI, normal lids, iris ENT:  grossly normal hearing, lips & tongue, mucous membranes moist and intact Neck: no lymphoadenopathy, masses or thyromegaly Cardiovascular: RRR, no m/r/g. No JVD, carotid bruits. No LE edema.  Respiratory: bilateral mild expiratory wheezes, but have rales and rhonchi present. Normal respiratory effort. No accessory muscle use observed Abdomen: soft, non-tender, non-distended, no organomegaly or masses appreciated. BS present in all quadrants Skin: no rash, ulcers or induration seen on limited exam Musculoskeletal: does not follow commands to assess active ROM, no significant deformities noticed Psychiatric:patient is confused       Neurologic: unable to assess patient doesn't follow commands, unable to answer questions appropriately  Labs on Admission: I have personally reviewed following labs and imaging studies  CBC, BMP  GFR: Estimated  Creatinine Clearance: 31.8 mL/min (A) (by C-G formula based on SCr of 1.39 mg/dL (H)).   Creatinine Clearance: Estimated Creatinine Clearance: 31.8 mL/min (A) (by C-G formula based on SCr of 1.39 mg/dL (H)).    Radiological Exams on Admission: Dg Chest Portable 1 View  Result Date: 12/31/2017 CLINICAL DATA:  Altered mental status beginning today. EXAM: PORTABLE CHEST 1 VIEW COMPARISON:  10/17/2017.  03/31/2017.  04/01/2017. FINDINGS: The heart is enlarged. There is atherosclerosis and tortuosity of the thoracic aorta. There is patchy infiltrate and volume loss in both lower lobes left worse than right consistent with pneumonia. Upper lungs are clear. No sign of heart failure or effusion. IMPRESSION: Atelectasis and infiltrate in both lower lobes left worse than right. Basilar pneumonia suspected, particularly on the left. Electronically Signed   By: Paulina Fusi M.D.   On: 12/31/2017 15:06    EKG: Independently reviewed -sinus rhythm  with PACs, no acute ST-T wave elevation  Assessment/Plan Principal Problem:   CAP (community acquired pneumonia) Active Problems:   Paralysis agitans (HCC)   Memory deficits   Hypothyroidism   Community-acquired pneumonia - Continue IV antibiotics, expectorant, Albuterol prn, supplemental O2  Hypothyroidism - most recent TSH Continue Synthroid and if needed, adjust the dose  Memory deficit with confusion at base line - provide supportive care and monitor for safety  Paralysis agitans - Continue home medications, provide supportive care and assistance with ambulation and ADLs  Sleep disorder of the REM phase - continued to Desyrel   DVT prophylaxis: Lovenox Code Status: Full  Family Communication: none Disposition Plan: med-surg Consults called: none Admission status: inapatient   Raymon MuttonMarina Mory Herrman, PA-C Pager: [phone] [facility]  If 7PM-7AM, please contact night-coverage [email or web address] Password [email or web address]  12/31/2017, 4:25 PM

## 2017-12-31 NOTE — Progress Notes (Signed)
ANTIBIOTIC CONSULT NOTE - INITIAL  Pharmacy Consult for Vanco/Zosyn Indication: sepsis  No Known Allergies  Patient Measurements: Weight: 138 lb (62.6 kg) Adjusted Body Weight:    Vital Signs: Temp: 98.9 F (37.2 C) (01/30 1822) Temp Source: Oral (01/30 1822) BP: 105/57 (01/30 1822) Pulse Rate: 71 (01/30 1822) Intake/Output from previous day: No intake/output data recorded. Intake/Output from this shift: No intake/output data recorded.  Labs: Recent Labs    12/31/17 1418 12/31/17 1747  WBC 11.7* 10.5  HGB 12.7* 11.9*  PLT 165 160  CREATININE 1.39* 1.27*   Estimated Creatinine Clearance: 34.8 mL/min (A) (by C-G formula based on SCr of 1.27 mg/dL (H)). No results for input(s): VANCOTROUGH, VANCOPEAK, VANCORANDOM, GENTTROUGH, GENTPEAK, GENTRANDOM, TOBRATROUGH, TOBRAPEAK, TOBRARND, AMIKACINPEAK, AMIKACINTROU, AMIKACIN in the last 72 hours.   Microbiology: No results found for this or any previous visit (from the past 720 hour(s)).  Medical History: Past Medical History:  Diagnosis Date  . Hypothyroidism   . Memory deficits 10/22/2013  . Parkinson disease (HCC)   . Pneumonia    "this is his 2nd time" (12/31/2017)  . 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 Sepsis due to PNA. +UA. Hypotension  PMH: Parkinson's, dementia, and hypothyroidism, CKD, sleep disorder,  ID: Sepsis due to PNA. Temp 100.3. WBC 10.5. +UA. Flu negative  Goal of Therapy:  Vancomycin trough level 15-20 mcg/ml  Plan:  Zosyn 3.375g IV q8 hrs Vancomycin 1g IV q 24h Vanco trough after 3-5 doses at steady state  Alisen Marsiglia S. Merilynn Finlandobertson, PharmD, BCPS Clinical Staff Pharmacist Pager [phone]   Misty Stanleyobertson, Emon Lance Stillinger 12/31/2017,8:34 PM

## 2017-12-31 NOTE — ED Triage Notes (Signed)
Pt to ER for AMS via [facility] - per family, patient is normally able to communicate and interact, but for 2 days mental status has declined. Hx of dementia but not at baseline. On fire arrival, patient sats in the 80's, placed on 3L sats WNL at this time.

## 2017-12-31 NOTE — ED Notes (Signed)
Admitting at bedside 

## 2017-12-31 NOTE — ED Provider Notes (Signed)
[facility] EMERGENCY DEPARTMENT Provider Note   CSN: [id number] Arrival date & time: 12/31/17  1322     History   Chief Complaint No chief complaint on file.   HPI Craig Giles is a 84 y.o. male.  HPI  Patient is an 84 year old male with history of Parkinson's disease and dementia.  He is presenting today with hypoxia and altered mental status.  Patient according to EMS is more altered.  EMS found him to be hypoxic to 85%.  Patient unable to give any type of history.  Level 5 caveat dementia Past Medical History:  Diagnosis Date  . Hypothyroidism   . Memory deficits 10/22/2013  . Parkinson disease (HCC)   . 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 Generalized weakness 05/28/2017  . Indwelling Foley catheter present   . Rectal bleeding   . GI bleed 05/18/2017  . Pressure injury of skin 04/01/2017  . PNA (pneumonia) 03/31/2017  . Chronic insomnia 08/12/2016  . 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 Memory deficits 10/22/2013  . Abnormality of gait 11/10/2012  . Paralysis agitans (HCC) 11/10/2012    Past Surgical History:  Procedure Laterality Date  . TONSILLECTOMY         Home Medications    Prior to Admission medications   Medication Sig Start Date End Date Taking? Authorizing Provider  acetaminophen (TYLENOL) 325 MG tablet Take 650 mg by mouth every 6 (six) hours as needed for mild pain, moderate pain, fever or headache.     [provider]  alum & mag hydroxide-simeth (MINTOX) 200-200-20 MG/5ML suspension Take 30 mLs by mouth every 6 (six) hours as needed for indigestion or heartburn.    [provider]  carbidopa-levodopa (SINEMET IR) 25-100 MG tablet Take 1 tablet by mouth 3 (three) times daily. 12/30/16   York Spaniel, MD  cephALEXin (KEFLEX) 500 MG capsule Take 1 capsule (500 mg total) 2 (two) times daily by mouth. 10/17/17   Gwyneth Sprout, MD    guaifenesin (ROBAFEN) 100 MG/5ML syrup Take 200 mg by mouth 3 (three) times daily as needed for cough.    [provider]  levothyroxine (SYNTHROID, LEVOTHROID) 88 MCG tablet Take 88 mcg by mouth daily before breakfast.     [provider]  loperamide (IMODIUM A-D) 2 MG tablet Take 2 mg by mouth 4 (four) times daily as needed for diarrhea or loose stools.    [provider]  Multiple Vitamin (MULTIVITAMIN WITH MINERALS) TABS tablet Take 1 tablet by mouth daily.    [provider]  polyethylene glycol (MIRALAX / GLYCOLAX) packet Take 17 g by mouth daily.     [provider]  ropinirole (REQUIP) 5 MG tablet Take 5 mg by mouth daily.    [provider]  saccharomyces boulardii (FLORASTOR) 250 MG capsule Take 250 mg by mouth daily.    [provider]  sulfamethoxazole-trimethoprim (BACTRIM DS,SEPTRA DS) 800-160 MG tablet Take 1 tablet 2 (two) times daily by mouth. Started 10-08-17. Completed on 10-14-17    [provider]  traZODone (DESYREL) 100 MG tablet Take 1 tablet (100 mg total) by mouth at bedtime. 12/30/16   York Spaniel, MD  vitamin C (ASCORBIC ACID) 250 MG tablet Take 250 mg by mouth daily.    [provider]  zinc sulfate 220 (50 Zn) MG capsule Take 220 mg by mouth daily.    [provider]  Family History Family History  Problem Relation Age of Onset  . Diabetes Brother     Social History Social History   Tobacco Use  . Smoking status: Never Smoker  . Smokeless tobacco: Never Used  Substance Use Topics  . Alcohol use: No  . Drug use: No     Allergies   Patient has no known allergies.   Review of Systems Review of Systems  Unable to perform ROS: Dementia  Constitutional: Negative for activity change.  Respiratory: Positive for shortness of breath.   Cardiovascular: Negative for chest pain.  Gastrointestinal: Negative for abdominal pain.     Physical Exam Updated Vital  Signs BP 106/86   Pulse 70   Temp 99.4 F (37.4 C) (Oral)   Resp 18   Wt 62.6 kg (138 lb)   SpO2 100%   BMI 24.45 kg/m   Physical Exam  Constitutional: He appears well-nourished.  Mildly formed neck, chronic.  Chronic paresis of right upper extremity.  HENT:  Head: Normocephalic.  Eyes: Conjunctivae are normal. Right eye exhibits no discharge. Left eye exhibits no discharge.  Cardiovascular: Normal rate.  Pulmonary/Chest: Effort normal and breath sounds normal. No respiratory distress.  Patient hypoxic to 85% without oxygen.  Neurological:  Oriented to self.  Skin: Skin is warm and dry. He is not diaphoretic.     ED Treatments / Results  Labs (all labs ordered are listed, but only abnormal results are displayed) Labs Reviewed  INFLUENZA PANEL BY PCR (TYPE A & B)  COMPREHENSIVE METABOLIC PANEL  CBC WITH DIFFERENTIAL/PLATELET  URINALYSIS, ROUTINE W REFLEX MICROSCOPIC  I-STAT CG4 LACTIC ACID, ED  I-STAT TROPONIN, ED    EKG  EKG Interpretation  Date/Time:  Wednesday December 31 2017 13:34:41 EST Ventricular Rate:  71 PR Interval:    QRS Duration: 95 QT Interval:  406 QTC Calculation: 442 R Axis:   -22 Text Interpretation:  Sinus rhythm Atrial premature complex Short PR interval Borderline left axis deviation Low voltage, precordial leads Abnormal R-wave progression, early transition no sig change from previous Confirmed by Arby BarrettePfeiffer, Marcy [id number]) on 12/31/2017 1:43:52 PM       Radiology No results found.  Procedures Procedures (including critical care time)  CRITICAL CARE Performed by: Arlana Hoveourteney L Rondi Ivy Total critical care time: 30 minutes Critical care time was exclusive of separately billable procedures and treating other patients. Critical care was necessary to treat or prevent imminent or life-threatening deterioration. Critical care was time spent personally by me on the following activities: development of treatment plan with patient and/or surrogate  as well as nursing, discussions with consultants, evaluation of patient's response to treatment, examination of patient, obtaining history from patient or surrogate, ordering and performing treatments and interventions, ordering and review of laboratory studies, ordering and review of radiographic studies, pulse oximetry and re-evaluation of patient's condition.   Medications Ordered in ED Medications - No data to display   Initial Impression / Assessment and Plan / ED Course  I have reviewed the triage vital signs and the nursing notes.  Pertinent labs & imaging results that were available during my care of the patient were reviewed by me and considered in my medical decision making (see chart for details).     Patient is an 84 year old male with history of Parkinson's disease and dementia.  He is presenting today with hypoxia and altered mental status.  Patient according to EMS is more altered.  EMS found him to be hypoxic to 85%.  Patient unable to give  any type of history.  Level 5 caveat dementia  2:18 PM Difficult to get any kind of history from patient.  However he is hypoxic.  He has low-grade temperature.  Will look for flu, pneumonia.  Will admit for hypoxemia.      Final Clinical Impressions(s) / ED Diagnoses   Final diagnoses:  None    ED Discharge Orders    None       Abelino Derrick, MD 01/01/18 2302

## 2017-12-31 NOTE — ED Notes (Signed)
[facility] a [facility]

## 2018-01-01 ENCOUNTER — Inpatient Hospital Stay (HOSPITAL_COMMUNITY): Payer: Medicare Other

## 2018-01-01 LAB — BASIC METABOLIC PANEL
ANION GAP: 9 (ref 5–15)
BUN: 19 mg/dL (ref 6–20)
CALCIUM: 9.1 mg/dL (ref 8.9–10.3)
CHLORIDE: 106 mmol/L (ref 101–111)
CO2: 22 mmol/L (ref 22–32)
Creatinine, Ser: 1.08 mg/dL (ref 0.61–1.24)
GFR calc non Af Amer: >60 mL/min (ref >60)
Glucose, Bld: 140 mg/dL — ABNORMAL HIGH (ref 65–99)
POTASSIUM: 4.2 mmol/L (ref 3.5–5.1)
Sodium: 137 mmol/L (ref 135–145)

## 2018-01-01 LAB — CBC
HEMATOCRIT: 36.7 % — AB (ref 39.0–52.0)
HEMOGLOBIN: 11.9 g/dL — AB (ref 13.0–17.0)
MCH: 29.3 pg (ref 26.0–34.0)
MCHC: 32.4 g/dL (ref 30.0–36.0)
MCV: 90.4 fL (ref 78.0–100.0)
Platelets: 151 10*3/uL (ref 150–400)
RBC: 4.06 MIL/uL — AB (ref 4.22–5.81)
RDW: 14.6 % (ref 11.5–15.5)
WBC: 6.4 10*3/uL (ref 4.0–10.5)

## 2018-01-01 LAB — GLUCOSE, CAPILLARY
GLUCOSE-CAPILLARY: 132 mg/dL — AB (ref 65–99)
Glucose-Capillary: 121 mg/dL — ABNORMAL HIGH (ref 65–99)
Glucose-Capillary: 184 mg/dL — ABNORMAL HIGH (ref 65–99)
Glucose-Capillary: 127 mg/dL — ABNORMAL HIGH (ref 65–99)
Glucose-Capillary: 143 mg/dL — ABNORMAL HIGH (ref 65–99)

## 2018-01-01 LAB — MRSA PCR SCREENING: MRSA BY PCR: NEGATIVE

## 2018-01-01 LAB — STREP PNEUMONIAE URINARY ANTIGEN: Strep Pneumo Urinary Antigen: NEGATIVE

## 2018-01-01 MED ORDER — IPRATROPIUM-ALBUTEROL 0.5-2.5 (3) MG/3ML IN SOLN
3.0000 mL | Freq: Three times a day (TID) | RESPIRATORY_TRACT | Status: DC
  Filled 2018-01-01 (×3): qty 3

## 2018-01-01 NOTE — Progress Notes (Signed)
Modified Barium Swallow Progress Note  Patient Details  Name: Craig Giles MRN: [id number] Date of Birth: 12/24/1930  Today's Date: 01/01/2018  Modified Barium Swallow completed.  Full report located under Chart Review in the Imaging Section.  Brief recommendations include the following:  Clinical Impression  Pt demonstrates swallow function within normal limits in setting of severe curvature of spine and horizontal position of pharynx. Oral phase, timing and strength of swallow are all WNL. Trace flash and sensed penetration events were observed due to vulnerability of airway position during the swallow. But given good strength and adequate sensation and airway protection aspiration risk is still minimal. Recommend pt continue diet of choice. No SLP f/u needed for swallowing.    Swallow Evaluation Recommendations       SLP Diet Recommendations: Regular solids;Thin liquid   Liquid Administration via: Cup;Straw   Medication Administration: Whole meds with liquid   Supervision: Patient able to self feed           Oral Care Recommendations: Oral care BID       Harlon DittyBonnie Ankita Newcomer, MA CCC-SLP [phone]  Claudine MoutonDeBlois, Tiffney Haughton Caroline 01/01/2018,2:25 PM

## 2018-01-01 NOTE — Progress Notes (Signed)
Hospitalist progress note   Danley DankerRaymond Hissong  ZOX:[id number] DOB: 10/29/1931 DOA: 12/31/2017 PCP: [facility]   Specialists:   Brief Narrative:  [facility] resident hypothyoid, PD, chr Urinary retention, dementia-prior MMSE 12/2015 30/30, prior rectal bleed-diverticular/hemorrhoid, REM sleep disorder admit 12/31/17 with AMS as well as possible aspiration PNA  Assessment & Plan:   Assessment:  There were no encounter diagnoses.  Aspiration PNA-keep NPO-await speech eval-cont vacn/zosyn and narrow in am to Zosyn monotherapy if confirmed by SLP aspiration-wbc 11-->10.  Cont saline LR 100cc/h--narrow off solu-edrol to prednisone  AMS-2/2 sepsis-hold sedating agent Trazadone 100, Tramadol 50 q 6 Parkinsons on meds-cont sinement 1 tid as able and Requip 5 qd CHr Urinary retention -cont flomax 0.4 and finasteride 5 daily--rpior has been on suppressive therapy- will monitor and re-eval as prn Hypothyroid-cont synthroid 78 mcg daily--Need sOP TSH 2-3 weeks Prior rectal bleed-hemoglobin 11 but seems stable-monitor only  DVT prophylaxis: lovenox  Code Status:    Full presuemd   Family Communication:    Called exwife roseanne--no response  On phone Disposition Plan: inpatient   Consultants:   none  Procedures:   none  Antimicrobials:   vanc/zosyn   Subjective: Awake alert not completely oriented thinks in [place]  pulling at restraints but calm when off of them Can remember his ex-eife name-thinks 2020 is the year   Objective: Vitals:   12/31/17 1800 12/31/17 1822 12/31/17 2136 01/01/18 0504  BP: 106/66 (!) 105/57 90/69 129/71  Pulse: 68 71 70 75  Resp: (!) 22 20 20 20   Temp:  98.9 F (37.2 C) 99.5 F (37.5 C) 98.5 F (36.9 C)  TempSrc:  Oral Oral Oral  SpO2: 98% (!) 89% 99% 93%  Weight:        Intake/Output Summary (Last 24 hours) at 01/01/2018 0943 Last data filed at 01/01/2018 0546 Gross per 24 hour  Intake 1036.66 ml  Output 600 ml  Net  436.66 ml   Filed Weights   12/31/17 1333  Weight: 62.6 kg (138 lb)    Examination:  eomi ncat kyphotic in nad cta b HSM at LUSE  abd soft nt nd No foley-condom cath No le edema some clawing-able to lift legs off of the bed  Data Reviewed: I have personally reviewed following labs and imaging studies  CBC: Recent Labs  Lab 12/31/17 1418 12/31/17 1747  WBC 11.7* 10.5  NEUTROABS 9.6*  --   HGB 12.7* 11.9*  HCT 39.7 37.2*  MCV 91.5 91.0  PLT 165 160   Basic Metabolic Panel: Recent Labs  Lab 12/31/17 1418 12/31/17 1747 01/01/18 0706  NA 138  --  137  K 4.6  --  4.2  CL 105  --  106  CO2 22  --  22  GLUCOSE 105*  --  140*  BUN 25*  --  19  CREATININE 1.39* 1.27* 1.08  CALCIUM 9.3  --  9.1   GFR: Estimated Creatinine Clearance: 41 mL/min (by C-G formula based on SCr of 1.08 mg/dL). Liver Function Tests: Recent Labs  Lab 12/31/17 1418  AST 33  ALT 6*  ALKPHOS 100  BILITOT 1.7*  PROT 6.7  ALBUMIN 3.6   No results for input(s): LIPASE, AMYLASE in the last 168 hours. No results for input(s): AMMONIA in the last 168 hours. Coagulation Profile: Recent Labs  Lab 12/31/17 2020  INR 1.16   Cardiac Enzymes: No results for input(s): CKTOTAL, CKMB, CKMBINDEX, TROPONINI in the last 168 hours. CBG: Recent  Labs  Lab 01/01/18 0430 01/01/18 0756  GLUCAP 121* 132*   Urine analysis:    Component Value Date/Time   COLORURINE AMBER (A) 12/31/2017 1853   APPEARANCEUR CLOUDY (A) 12/31/2017 1853   LABSPEC 1.023 12/31/2017 1853   PHURINE 5.0 12/31/2017 1853   GLUCOSEU NEGATIVE 12/31/2017 1853   HGBUR MODERATE (A) 12/31/2017 1853   BILIRUBINUR NEGATIVE 12/31/2017 1853   KETONESUR 5 (A) 12/31/2017 1853   PROTEINUR NEGATIVE 12/31/2017 1853   NITRITE NEGATIVE 12/31/2017 1853   LEUKOCYTESUR LARGE (A) 12/31/2017 1853     Radiology Studies: Reviewed images personally in health database    Scheduled Meds: . carbidopa-levodopa  1 tablet Oral TID  . enoxaparin  (LOVENOX) injection  40 mg Subcutaneous Q24H  . finasteride  5 mg Oral Daily  . ipratropium-albuterol  3 mL Nebulization TID  . levothyroxine  88 mcg Oral QAC breakfast  . methylPREDNISolone (SOLU-MEDROL) injection  80 mg Intravenous Daily  . polyethylene glycol  17 g Oral Daily  . ropinirole  5 mg Oral QHS  . saccharomyces boulardii  250 mg Oral BID  . tamsulosin  0.4 mg Oral Daily  . traZODone  100 mg Oral QHS   Continuous Infusions: . lactated ringers 100 mL/hr at 12/31/17 2144  . piperacillin-tazobactam (ZOSYN)  IV Stopped (01/01/18 0946)  . vancomycin Stopped (12/31/17 2255)     LOS: 1 day    Time spent: 20    Pleas Koch, MD [facility]   If 7PM-7AM, please contact night-coverage [email or web address] Password [facility] 01/01/2018, 9:43 AM

## 2018-01-01 NOTE — Progress Notes (Signed)
Pt up last night confused pulled IV out twice,pulled condom cath off,and would not leave nasal cannula  On for oxygen,pt kept taking it off,bil Mittens placed to hands to prevent patient from pulling out any lines or tubes that are being used to give medication,oxygen or fluids will continue to monitor.

## 2018-01-01 NOTE — Evaluation (Signed)
Clinical/Bedside Swallow Evaluation Patient Details  Name: Danley DankerRaymond Vertz MRN: [id number] Date of Birth: 05/24/1931  Today's Date: 01/01/2018 Time: SLP Start Time (ACUTE ONLY): 0930 SLP Stop Time (ACUTE ONLY): 0956 SLP Time Calculation (min) (ACUTE ONLY): 26 min  Past Medical History:  Past Medical History:  Diagnosis Date  . Hypothyroidism   . Memory deficits 10/22/2013  . Parkinson disease (HCC)   . Pneumonia    "this is his 2nd time" (12/31/2017)  . 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 CATARACT EXTRACTION W/ INTRAOCULAR LENS  IMPLANT, BILATERAL Bilateral   . TONSILLECTOMY     HPI:  Marcy SalvoRaymond Caligiureis a 84 y.o.malewith a Past Medical History of Parkinson's and dementia, who presents with AMS.    Assessment / Plan / Recommendation Clinical Impression   At bedside, pt presents with no overt s/s of aspiration with solids, purees, and thin liquids via straw, even when challenged to take large, consecutive straw sips.  Despite s/s of good airway protection at bedside, pt has several factors impacting overall swallowing safety, including kyphosis, dependence for feeding, and cognitive impairment.  Pt was on a modified diet at baseline, stating he preferred "softer foods," but otherwise endorsed no additional swallowing difficulty.   Given recurrent PNA and history of Parkinson's Disease, would recommend a MBS to determine safest diet consistencies and/or compensatory strategies to maximize swallowing safety.  For now, will initiate a diet of Dys 3, thin liquids,  meds whole with liquids.    SLP Visit Diagnosis: Dysphagia, unspecified (R13.10)    Aspiration Risk  Mild aspiration risk    Diet Recommendation Dysphagia 3 (Mech soft);Thin liquid   Liquid Administration via: Straw Medication Administration: Whole meds with liquid Supervision: Staff to assist with self feeding;Full supervision/cueing  for compensatory strategies Compensations: Slow rate;Small sips/bites Postural Changes: Remain upright for at least 30 minutes after po intake    Other  Recommendations Oral Care Recommendations: Oral care BID   Follow up Recommendations Skilled Nursing facility      Frequency and Duration min 1 x/week          Prognosis Prognosis for Safe Diet Advancement: Fair Barriers to Reach Goals: Cognitive deficits      Swallow Study   General Date of Onset: (see HPI) HPI: Marcy SalvoRaymond Caligiureis a 84 y.o.malewith a Past Medical History of Parkinson's and dementia, who presents with AMS.  Type of Study: Bedside Swallow Evaluation Previous Swallow Assessment: none on record Diet Prior to this Study: NPO Temperature Spikes Noted: No Respiratory Status: Nasal cannula History of Recent Intubation: No Behavior/Cognition: Alert;Cooperative;Pleasant mood Oral Cavity Assessment: Within Functional Limits Oral Cavity - Dentition: Adequate natural dentition Vision: Functional for self-feeding Self-Feeding Abilities: Total assist Patient Positioning: Upright in bed(kyphotic) Baseline Vocal Quality: Normal Volitional Cough: Strong Volitional Swallow: Able to elicit    Oral/Motor/Sensory Function Overall Oral Motor/Sensory Function: Within functional limits   Ice Chips     Thin Liquid Thin Liquid: Within functional limits    Nectar Thick     Honey Thick     Puree Puree: Within functional limits   Solid   GO   Solid: Within functional limits        Jeremaine Maraj, Joni ReiningNicole L 01/01/2018,10:15 AM

## 2018-01-02 LAB — CBC WITH DIFFERENTIAL/PLATELET
BASOS PCT: 0 %
Basophils Absolute: 0 10*3/uL (ref 0.0–0.1)
EOS ABS: 0 10*3/uL (ref 0.0–0.7)
EOS PCT: 0 %
HCT: 33.7 % — ABNORMAL LOW (ref 39.0–52.0)
HEMOGLOBIN: 10.9 g/dL — AB (ref 13.0–17.0)
Lymphocytes Relative: 6 %
Lymphs Abs: 0.6 10*3/uL — ABNORMAL LOW (ref 0.7–4.0)
MCH: 28.9 pg (ref 26.0–34.0)
MCHC: 32.3 g/dL (ref 30.0–36.0)
MCV: 89.4 fL (ref 78.0–100.0)
MONOS PCT: 6 %
Monocytes Absolute: 0.7 10*3/uL (ref 0.1–1.0)
NEUTROS PCT: 88 %
Neutro Abs: 9.5 10*3/uL — ABNORMAL HIGH (ref 1.7–7.7)
PLATELETS: 165 10*3/uL (ref 150–400)
RBC: 3.77 MIL/uL — ABNORMAL LOW (ref 4.22–5.81)
RDW: 14.4 % (ref 11.5–15.5)
WBC: 10.7 10*3/uL — ABNORMAL HIGH (ref 4.0–10.5)

## 2018-01-02 LAB — RENAL FUNCTION PANEL
ANION GAP: 8 (ref 5–15)
Albumin: 2.8 g/dL — ABNORMAL LOW (ref 3.5–5.0)
BUN: 23 mg/dL — AB (ref 6–20)
CALCIUM: 8.9 mg/dL (ref 8.9–10.3)
CO2: 22 mmol/L (ref 22–32)
CREATININE: 1.25 mg/dL — AB (ref 0.61–1.24)
Chloride: 108 mmol/L (ref 101–111)
GFR calc Af Amer: 59 mL/min — ABNORMAL LOW (ref >60)
GFR calc non Af Amer: 51 mL/min — ABNORMAL LOW (ref >60)
GLUCOSE: 114 mg/dL — AB (ref 65–99)
Phosphorus: 2.5 mg/dL (ref 2.5–4.6)
Potassium: 3.9 mmol/L (ref 3.5–5.1)
SODIUM: 138 mmol/L (ref 135–145)

## 2018-01-02 MED ORDER — PREDNISONE 20 MG PO TABS
40.0000 mg | ORAL_TABLET | Freq: Every day | ORAL | Status: DC
  Administered 2018-01-03: 40 mg via ORAL
  Filled 2018-01-02: qty 2

## 2018-01-02 MED ORDER — TRAZODONE HCL 100 MG PO TABS
100.0000 mg | ORAL_TABLET | Freq: Every day | ORAL | Status: DC
  Administered 2018-01-02: 100 mg via ORAL
  Filled 2018-01-02: qty 1

## 2018-01-02 NOTE — Evaluation (Signed)
Physical Therapy Evaluation Patient Details Name: Craig Giles MRN: [id number] DOB: 08/21/1931 Today's Date: 01/02/2018   History of Present Illness  Pt is an 84 year old man admitted 12/31/17 with sepsis due to PNA. PMH: Parkinsons Disease, dementia.  Clinical Impression  Pt presented supine in bed with HOB elevated, awake and willing to participate in therapy session. Pt's spouse present throughout and providing information regarding PLOF. Pt very limited with functional mobility secondary to weakness, bradykinesia, poor motor planning and coordination and cognitive deficits. Pt currently requires total A x2 for bed mobility. Attempted to stand x2 from EOB with total A x2 and use of Stedy, but pt unable to achieve upright standing position. Pt would continue to benefit from skilled physical therapy services at this time while admitted and after d/c to address the below listed limitations in order to improve overall safety and independence with functional mobility.     Follow Up Recommendations SNF;Supervision/Assistance - 24 hour    Equipment Recommendations  None recommended by PT    Recommendations for Other Services       Precautions / Restrictions Precautions Precautions: Fall Restrictions Weight Bearing Restrictions: No      Mobility  Bed Mobility Overal bed mobility: Needs Assistance Bed Mobility: Supine to Sit;Sit to Supine     Supine to sit: +2 for safety/equipment;Total assist Sit to supine: +2 for safety/equipment;Total assist   General bed mobility comments: assist for all aspects  Transfers Overall transfer level: Needs assistance Equipment used: Rolling walker (2 wheeled) Transfers: Sit to/from Stand Sit to Stand: +2 physical assistance;Total assist         General transfer comment: attempted to stand x2 with Stedy and total A x2; pt unable to achieve full standing position with heavy physical assistance and max cueing  Ambulation/Gait                 Stairs            Wheelchair Mobility    Modified Rankin (Stroke Patients Only)       Balance Overall balance assessment: Needs assistance Sitting-balance support: Feet supported Sitting balance-Leahy Scale: Poor Sitting balance - Comments: close min guard assist statically Postural control: Posterior lean   Standing balance-Leahy Scale: Zero Standing balance comment: unable to stand with +2 assist                             Pertinent Vitals/Pain Pain Assessment: No/denies pain    Home Living Family/patient expects to be discharged to:: Assisted living([facility])               Home Equipment: Walker - 4 wheels;Wheelchair - manual      Prior Function Level of Independence: Needs assistance   Gait / Transfers Assistance Needed: assist of 2 to transfer, wife reports he walks with assists and a RW  ADL's / Homemaking Assistance Needed: increased spillage with self feeding, dependent in all ADL  Comments: wife providing PLOF, pt is a poor historian     Hand Dominance   Dominant Hand: Right    Extremity/Trunk Assessment   Upper Extremity Assessment Upper Extremity Assessment: Defer to OT evaluation    Lower Extremity Assessment Lower Extremity Assessment: Generalized weakness    Cervical / Trunk Assessment Cervical / Trunk Assessment: Kyphotic;Other exceptions Cervical / Trunk Exceptions: L lateral cervical flexion  Communication   Communication: No difficulties  Cognition Arousal/Alertness: Awake/alert Behavior During Therapy: Flat affect Overall Cognitive Status:  History of cognitive impairments - at baseline                                 General Comments: pt with hx of dementia, having hallucinations during session      General Comments      Exercises     Assessment/Plan    PT Assessment Patient needs continued PT services  PT Problem List Decreased strength;Decreased balance;Decreased  mobility;Decreased coordination;Decreased knowledge of use of DME;Decreased cognition;Decreased safety awareness;Decreased knowledge of precautions       PT Treatment Interventions DME instruction;Functional mobility training;Therapeutic activities;Therapeutic exercise;Balance training;Neuromuscular re-education;Cognitive remediation;Patient/family education;Gait training    PT Goals (Current goals can be found in the Care Plan section)  Acute Rehab PT Goals Patient Stated Goal: to get stronger PT Goal Formulation: With patient/family Time For Goal Achievement: 01/16/18 Potential to Achieve Goals: Fair    Frequency Min 2X/week   Barriers to discharge        Co-evaluation PT/OT/SLP Co-Evaluation/Treatment: Yes Reason for Co-Treatment: For patient/therapist safety;To address functional/ADL transfers PT goals addressed during session: Mobility/safety with mobility;Balance;Proper use of DME;Strengthening/ROM OT goals addressed during session: Strengthening/ROM       AM-PAC PT "6 Clicks" Daily Activity  Outcome Measure Difficulty turning over in bed (including adjusting bedclothes, sheets and blankets)?: Unable Difficulty moving from lying on back to sitting on the side of the bed? : Unable Difficulty sitting down on and standing up from a chair with arms (e.g., wheelchair, bedside commode, etc,.)?: Unable Help needed moving to and from a bed to chair (including a wheelchair)?: Total Help needed walking in hospital room?: Total Help needed climbing 3-5 steps with a railing? : Total 6 Click Score: 6    End of Session Equipment Utilized During Treatment: Gait belt Activity Tolerance: Patient limited by fatigue Patient left: in bed;with call bell/phone within reach;with bed alarm set;with family/visitor present Nurse Communication: Mobility status;Need for lift equipment PT Visit Diagnosis: Other abnormalities of gait and mobility (R26.89);Muscle weakness (generalized)  (M62.81);Other symptoms and signs involving the nervous system (R29.898)    Time: 1402-1430 PT Time Calculation (min) (ACUTE ONLY): 28 min   Charges:   PT Evaluation $PT Eval Moderate Complexity: 1 Mod     PT G Codes:        [place], PT, [place] [phone]   Alessandra Bevels Yanisa Goodgame 01/02/2018, 3:41 PM

## 2018-01-02 NOTE — Evaluation (Signed)
Occupational Therapy Evaluation and Discharge Patient Details Name: Craig Giles MRN: [id number] DOB: 08/19/1931 Today's Date: 01/02/2018    History of Present Illness Pt is an 84 year old man admitted 12/31/17 with sepsis due to PNA. PMH: Parkinsons Disease, dementia.   Clinical Impression   Pt requires extensive assistance for mobility and ADL at the ALF and primarily uses a w/c. Pt requiring +2 total assist for bed mobility and was unable to stand with RW or use of Corene CorneaSara Stedy this visit. Will require lift equipment to transfer. No acute OT needs. Recommending SNF vs return to his ALF depending on level of assist available.     Follow Up Recommendations  SNF;Supervision/Assistance - 24 hour(Return to ALF if staff is able to provide adequate assist.)    Equipment Recommendations       Recommendations for Other Services       Precautions / Restrictions Precautions Precautions: Fall      Mobility Bed Mobility Overal bed mobility: Needs Assistance Bed Mobility: Supine to Sit;Sit to Supine     Supine to sit: +2 for safety/equipment;Total assist Sit to supine: +2 for safety/equipment;Total assist   General bed mobility comments: assist for all aspects  Transfers Overall transfer level: Needs assistance Equipment used: Rolling walker (2 wheeled) Transfers: Sit to/from Stand Sit to Stand: +2 physical assistance;Total assist         General transfer comment: Pt unable to achieve standing with RW or sara stedy from elevated bed, heavy posterior lean, generalized weakness    Balance Overall balance assessment: Needs assistance Sitting-balance support: Feet supported Sitting balance-Leahy Scale: Poor Sitting balance - Comments: close min guard assist statically Postural control: Posterior lean   Standing balance-Leahy Scale: Zero Standing balance comment: unable to stand with +2 assist                           ADL either performed or assessed with  clinical judgement   ADL Overall ADL's : At baseline                                             Vision         Perception     Praxis      Pertinent Vitals/Pain Pain Assessment: No/denies pain     Hand Dominance Right   Extremity/Trunk Assessment Upper Extremity Assessment Upper Extremity Assessment: Generalized weakness(bradykinesis)   Lower Extremity Assessment Lower Extremity Assessment: Defer to PT evaluation   Cervical / Trunk Assessment Cervical / Trunk Assessment: Kyphotic;Other exceptions Cervical / Trunk Exceptions: L lateral cervical flexion   Communication Communication Communication: No difficulties   Cognition Arousal/Alertness: Awake/alert Behavior During Therapy: Flat affect Overall Cognitive Status: History of cognitive impairments - at baseline                                 General Comments: pt with hx of dementia, having hallucinations during session   General Comments       Exercises     Shoulder Instructions      Home Living Family/patient expects to be discharged to:: Assisted living([facility])                             Home Equipment:  Walker - 4 wheels;Wheelchair - manual          Prior Functioning/Environment Level of Independence: Needs assistance  Gait / Transfers Assistance Needed: assist of 2 to transfer, wife reports he walks with assists and a RW ADL's / Homemaking Assistance Needed: increased spillage with self feeding, dependent in all ADL   Comments: wife providing PLOF, pt is a poor historian        OT Problem List:        OT Treatment/Interventions:      OT Goals(Current goals can be found in the care plan section) Acute Rehab OT Goals Patient Stated Goal: to get stronger  OT Frequency:     Barriers to D/C:            Co-evaluation PT/OT/SLP Co-Evaluation/Treatment: Yes Reason for Co-Treatment: For patient/therapist safety   OT goals addressed  during session: Strengthening/ROM      AM-PAC PT "6 Clicks" Daily Activity     Outcome Measure Help from another person eating meals?: A Lot Help from another person taking care of personal grooming?: Total Help from another person toileting, which includes using toliet, bedpan, or urinal?: Total Help from another person bathing (including washing, rinsing, drying)?: Total Help from another person to put on and taking off regular upper body clothing?: Total Help from another person to put on and taking off regular lower body clothing?: Total 6 Click Score: 7   End of Session Equipment Utilized During Treatment: Gait belt;Oxygen;Rolling walker Nurse Communication: Mobility status;Need for lift equipment  Activity Tolerance: Patient tolerated treatment well Patient left: in bed;with call bell/phone within reach;with bed alarm set;with family/visitor present  OT Visit Diagnosis: Muscle weakness (generalized) (M62.81);Other symptoms and signs involving cognitive function                Time: 1402-1430 OT Time Calculation (min): 28 min Charges:  OT General Charges $OT Visit: 1 Visit OT Evaluation $OT Eval Moderate Complexity: 1 Mod G-Codes:     2018/01/29 Martie Round, OTR/L Pager: [phone] Iran Planas, [place] Bailiff 01-29-2018, 2:46 PM

## 2018-01-02 NOTE — Progress Notes (Signed)
Hospitalist progress note   Craig Giles  ZOX:[id number] DOB: 06/28/1931 DOA: 12/31/2017 PCP: [facility]   Specialists:   Brief Narrative:  59 [facility] resident hypothyoid, PD, chr Urinary retention, dementia-prior MMSE 12/2015 30/30, prior rectal bleed-diverticular/hemorrhoid, REM sleep disorder admit 12/31/17 with AMS as well as possible aspiration PNA  Assessment & Plan:   Assessment:  The encounter diagnosis was HCAP (healthcare-associated pneumonia).  Aspiration PNA-grad by SLP to reg diet-narrow vanc to only zosyn n-wbc 11-->10.  Cont saline LR 100cc/h-->50 cc/h--narrow off solu-medrol to prednisone   AMS-2/2 sepsis-hold sedating agent Trazadone 100, Tramadol 50 q 6--will resume nightly trazadone-got confused 2/1 am and might be 2/2 poor sleep-keep restraints on for now, but doesn't need sitter Parkinsons on meds-cont sinement 1 tid as able and Requip 5 qd CHr Urinary retention -cont flomax 0.4 and finasteride 5 daily--prior  on suppressive therapy Hypothyroid-cont synthroid 78 mcg daily--Needs OP TSH 2-3 weeks Prior rectal bleed-hemoglobin 11 but seems stable-monitor only  DVT prophylaxis: lovenox  Code Status:    Full   Family Communication:    Spoke with ex at the bedside 1/31 Disposition Plan: inpatient   Consultants:   none  Procedures:   none  Antimicrobials:   vanc/zosyn   Subjective:  Confused today-thinks in an office-thinks he is waiting to pay a bill  Place din soft restraints No other further input discernable currently C/o aching in foot  Objective: Vitals:   01/01/18 0504 01/01/18 1820 01/01/18 2040 01/02/18 0630  BP: 129/71 130/77 138/70 115/64  Pulse: 75 78 62 67  Resp: 20 19 19 19   Temp: 98.5 F (36.9 C) 98.6 F (37 C) 98.6 F (37 C) 98.9 F (37.2 C)  TempSrc: Oral Oral Oral Oral  SpO2: 93% 94% 95% 96%  Weight:   63.5 kg (140 lb)     Intake/Output Summary (Last 24 hours) at 01/02/2018 0945 Last data  filed at 01/02/2018 8119 Gross per 24 hour  Intake 3200 ml  Output 2345 ml  Net 855 ml   Filed Weights   12/31/17 1333 01/01/18 2040  Weight: 62.6 kg (138 lb) 63.5 kg (140 lb)    Examination:  eomi ncat kyphotic in nad-confused in nad cta b HSM at LUSE  abd soft nt nd No foley-condom cath No le edema some clawing-able to lift legs off of the bed  Data Reviewed: I have personally reviewed following labs and imaging studies  CBC: Recent Labs  Lab 12/31/17 1418 12/31/17 1747 01/01/18 0706 01/02/18 0453  WBC 11.7* 10.5 6.4 10.7*  NEUTROABS 9.6*  --   --  9.5*  HGB 12.7* 11.9* 11.9* 10.9*  HCT 39.7 37.2* 36.7* 33.7*  MCV 91.5 91.0 90.4 89.4  PLT 165 160 151 165   Basic Metabolic Panel: Recent Labs  Lab 12/31/17 1418 12/31/17 1747 01/01/18 0706 01/02/18 0453  NA 138  --  137 138  K 4.6  --  4.2 3.9  CL 105  --  106 108  CO2 22  --  22 22  GLUCOSE 105*  --  140* 114*  BUN 25*  --  19 23*  CREATININE 1.39* 1.27* 1.08 1.25*  CALCIUM 9.3  --  9.1 8.9  PHOS  --   --   --  2.5   GFR: Estimated Creatinine Clearance: 35.4 mL/min (A) (by C-G formula based on SCr of 1.25 mg/dL (H)). Liver Function Tests: Recent Labs  Lab 12/31/17 1418 01/02/18 0453  AST 33  --  ALT 6*  --   ALKPHOS 100  --   BILITOT 1.7*  --   PROT 6.7  --   ALBUMIN 3.6 2.8*   No results for input(s): LIPASE, AMYLASE in the last 168 hours. No results for input(s): AMMONIA in the last 168 hours. Coagulation Profile: Recent Labs  Lab 12/31/17 2020  INR 1.16   Cardiac Enzymes: No results for input(s): CKTOTAL, CKMB, CKMBINDEX, TROPONINI in the last 168 hours. CBG: Recent Labs  Lab 01/01/18 0430 01/01/18 0756 01/01/18 1227 01/01/18 1639 01/01/18 2037  GLUCAP 121* 132* 184* 127* 143*   Urine analysis:    Component Value Date/Time   COLORURINE AMBER (A) 12/31/2017 1853   APPEARANCEUR CLOUDY (A) 12/31/2017 1853   LABSPEC 1.023 12/31/2017 1853   PHURINE 5.0 12/31/2017 1853    GLUCOSEU NEGATIVE 12/31/2017 1853   HGBUR MODERATE (A) 12/31/2017 1853   BILIRUBINUR NEGATIVE 12/31/2017 1853   KETONESUR 5 (A) 12/31/2017 1853   PROTEINUR NEGATIVE 12/31/2017 1853   NITRITE NEGATIVE 12/31/2017 1853   LEUKOCYTESUR LARGE (A) 12/31/2017 1853     Radiology Studies: Reviewed images personally in health database    Scheduled Meds: . carbidopa-levodopa  1 tablet Oral TID  . enoxaparin (LOVENOX) injection  40 mg Subcutaneous Q24H  . finasteride  5 mg Oral Daily  . ipratropium-albuterol  3 mL Nebulization TID  . levothyroxine  88 mcg Oral QAC breakfast  . methylPREDNISolone (SOLU-MEDROL) injection  80 mg Intravenous Daily  . polyethylene glycol  17 g Oral Daily  . ropinirole  5 mg Oral QHS  . saccharomyces boulardii  250 mg Oral BID  . tamsulosin  0.4 mg Oral Daily  . traZODone  100 mg Oral QHS   Continuous Infusions: . lactated ringers 100 mL/hr at 01/01/18 2358  . piperacillin-tazobactam (ZOSYN)  IV 3.375 g (01/02/18 0533)     LOS: 2 days    Time spent: 6620    Pleas KochJai Ravenna Legore, MD [facility](P) [phone]   If 7PM-7AM, please contact night-coverage [email or web address] Password [email or web address] 01/02/2018, 9:45 AM

## 2018-01-02 NOTE — Clinical Social Work Note (Addendum)
Clinical Social Work Assessment  Patient Details  Name: Craig Giles MRN: [id number] Date of Birth: Mar 04, 1931  Date of referral:  12/31/17               Reason for consult:  Facility Placement, Discharge Planning                Permission sought to share information with:  Other Permission granted to share information::     Name::        Agency::  [facility]  Relationship::     Contact Information:  ALF: [phone]  Housing/Transportation Living arrangements for the past 2 months:  Assisted Living Facility([facility]) Source of Information:  Spouse Patient Interpreter Needed:  None Criminal Activity/Legal Involvement Pertinent to Current Situation/Hospitalization:  No - Comment as needed Significant Relationships:  Spouse, Adult Children, Friend Lives with:  Facility Resident([facility]. Patient's wife lives in an apartment in [place]) Do you feel safe going back to the place where you live?  Yes Need for family participation in patient care:  Yes (Comment)  Care giving concerns:  Mrs. Craig Giles regarding discharge disposition and she feels ALF can take care of patient at discharge.   Social Worker assessment / plan: CSW talked with patient's wife at the bedside regarding discharge disposition as PT/OT has recommended SNF. [name] was in bed awake, but quiet during the conversation.  Mrs. Craig Giles confirmed that her husband lives at [facility] and the Giles we have Craig Giles.  CSW and wife discussed PT/OT recommendations of ST rehab and Mrs. Florer feels that staff at the ALF can care for her husband. Mrs. Craig Giles indicated that she cannot afford for her husband to return to a skilled facility. She  explained that she owes Craig Giles money [phone]) now from his visit last year (June 2018 per check of previous discharges). Mrs. Craig Giles also talked about having to pay the ALF ($3,900 per month plus $250 per month for his  meds) and bills she has gotten from the ambulance company. Wife explained that Craig Giles is the closest to where she lives and she would catch a cab to go see him when he was there. Mrs. Craig Giles expressed that she can't send him somewhere that she can get to, as he will want to see her, and she needs to visit with him regularly.   Employment status:  Retired Financial trader) PT Recommendations:  Skilled Nursing Facility Information / Referral to community resources:  Skilled Nursing Facility(SNF list not provided at initial visit with wife. She reports that she owes Craig Giles money from her husband's last stay)  Patient/Family's Response to care:  Mrs. Craig Giles expressed no concerns regarding patient's care at the hospital.  Patient/Family's Understanding of and Emotional Response to Diagnosis, Current Treatment, and Prognosis:  CSW talked with wife regarding OT and PT evals and the care they indicated her husband would need at d/c and Mrs. Craig Giles to indicate that she feels that ALF can provide the care he needs at discharge.  CSW attempted to reach the nursing director after visiting with wife (4:28 pm) and was unable to be connected this person.   Emotional Assessment Appearance:  Appears stated age Attitude/Demeanor/Rapport:  Other(Quiet) Affect (typically observed):  Calm, Quiet Orientation:  Oriented to Self, Oriented to Place Alcohol / Substance use:  Tobacco Use, Alcohol Use, Illicit Drugs(Patient reported that he has never smoked and does not drink or use illicit drugs) Psych involvement (Current and /or  in the community):  No (Comment)  Discharge Needs  Concerns to be addressed:  Discharge Planning Concerns, Financial / Insurance Concerns Readmission within the last 30 days:  No Current discharge risk:    Barriers to Discharge:  [facility] Work up, Community education officernsurance Authorization(If wife agrees to [facility], insurance auth needed)    **Patient has "A" PASRR - [id number],  Eff. 04/02/17.    Craig Giles, Craig Nikkel Bradley, LCSW 01/02/2018, 4:52 PM

## 2018-01-03 LAB — CBC WITH DIFFERENTIAL/PLATELET
BASOS ABS: 0 10*3/uL (ref 0.0–0.1)
BASOS PCT: 0 %
EOS ABS: 0.2 10*3/uL (ref 0.0–0.7)
EOS PCT: 3 %
HCT: 35.8 % — ABNORMAL LOW (ref 39.0–52.0)
Hemoglobin: 11.5 g/dL — ABNORMAL LOW (ref 13.0–17.0)
Lymphocytes Relative: 19 %
Lymphs Abs: 1.3 10*3/uL (ref 0.7–4.0)
MCH: 29.2 pg (ref 26.0–34.0)
MCHC: 32.1 g/dL (ref 30.0–36.0)
MCV: 90.9 fL (ref 78.0–100.0)
Monocytes Absolute: 0.4 10*3/uL (ref 0.1–1.0)
Monocytes Relative: 5 %
Neutro Abs: 4.9 10*3/uL (ref 1.7–7.7)
Neutrophils Relative %: 73 %
PLATELETS: 190 10*3/uL (ref 150–400)
RBC: 3.94 MIL/uL — ABNORMAL LOW (ref 4.22–5.81)
RDW: 14.9 % (ref 11.5–15.5)
WBC: 6.7 10*3/uL (ref 4.0–10.5)

## 2018-01-03 LAB — COMPREHENSIVE METABOLIC PANEL
ALT: 7 U/L — AB (ref 17–63)
AST: 26 U/L (ref 15–41)
Albumin: 2.7 g/dL — ABNORMAL LOW (ref 3.5–5.0)
Alkaline Phosphatase: 76 U/L (ref 38–126)
Anion gap: 8 (ref 5–15)
BUN: 20 mg/dL (ref 6–20)
CHLORIDE: 108 mmol/L (ref 101–111)
CO2: 24 mmol/L (ref 22–32)
CREATININE: 1.21 mg/dL (ref 0.61–1.24)
Calcium: 8.8 mg/dL — ABNORMAL LOW (ref 8.9–10.3)
GFR calc Af Amer: >60 mL/min (ref >60)
GFR calc non Af Amer: 53 mL/min — ABNORMAL LOW (ref >60)
Glucose, Bld: 81 mg/dL (ref 65–99)
POTASSIUM: 4 mmol/L (ref 3.5–5.1)
SODIUM: 140 mmol/L (ref 135–145)
Total Bilirubin: 1.1 mg/dL (ref 0.3–1.2)
Total Protein: 5.6 g/dL — ABNORMAL LOW (ref 6.5–8.1)

## 2018-01-03 MED ORDER — AMOXICILLIN-POT CLAVULANATE 875-125 MG PO TABS: 1.0000 | ORAL_TABLET | Freq: Two times a day (BID) | ORAL | 0 refills | Status: AC

## 2018-01-03 NOTE — Clinical Social Work Note (Signed)
Per RNCM pt will not need 02 at the ALF. ALF notified.   NavarinoBridget Anjel Pardo, [place] [phone]

## 2018-01-03 NOTE — Progress Notes (Signed)
CS[facility]  Osborne Cascoadia Nuel Dejaynes LCSW [phone]

## 2018-01-03 NOTE — NC FL2 (Signed)
[place] MEDICAID FL2 LEVEL OF CARE SCREENING TOOL     IDENTIFICATION  Patient Name: Craig Giles Birthdate: 1931-08-11 Sex: male Admission Date (Current Location): 12/31/2017  [facility] and [place] Number:  Producer, television/film/video and Address:  [facility] [place], [address], [place], [place] [address]      Provider Number: [id number]  Attending Physician Name and Address:  Rhetta Mura, MD  Relative Name and Phone Number:       Current Level of Care: Hospital Recommended Level of Care: Other (Comment)(ALF) Prior Approval Number:    Date Approved/Denied:   PASRR Number:    Discharge Plan: Other (Comment)(ALF)    Current Diagnoses: Patient Active Problem List   Diagnosis Date Noted  . CAP (community acquired pneumonia) 12/31/2017  . Hypothyroidism 12/31/2017  . Dementia due to Parkinson's disease without behavioral disturbance (HCC)   . HCAP (healthcare-associated pneumonia)   . Generalized weakness 05/28/2017  . Indwelling Foley catheter present   . Rectal bleeding   . GI bleed 05/18/2017  . Pressure injury of skin 04/01/2017  . PNA (pneumonia) 03/31/2017  . Chronic insomnia 08/12/2016  . 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 Memory deficits 10/22/2013  . Abnormality of gait 11/10/2012  . Paralysis agitans (HCC) 11/10/2012    Orientation RESPIRATION BLADDER Height & Weight     Self, Place  O2(Nasal Canula 2L) Continent Weight: 75.2 kg (165 lb 12.6 oz) Height:  5\' 4"  (162.6 cm)  BEHAVIORAL SYMPTOMS/MOOD NEUROLOGICAL BOWEL NUTRITION STATUS      Incontinent (Please see d/c summary)  AMBULATORY STATUS COMMUNICATION OF NEEDS Skin   Extensive Assist Verbally Normal                       Personal Care Assistance Level of Assistance  Bathing, Feeding, Dressing Bathing Assistance: Maximum assistance Feeding assistance: Limited assistance Dressing Assistance: Maximum assistance     Functional Limitations Info  Sight,  Hearing, Speech Sight Info: Adequate Hearing Info: Adequate Speech Info: Adequate    SPECIAL CARE FACTORS FREQUENCY  PT (By licensed PT), OT (By licensed OT)     PT Frequency: 2x OT Frequency: 2x            Contractures      Additional Factors Info  Code Status, Allergies Code Status Info: Full Code Allergies Info: No known allergies           Current Medications (01/03/2018):  This is the current hospital active medication list Current Facility-Administered Medications  Medication Dose Route Frequency Provider Last Rate Last Dose  . acetaminophen (TYLENOL) suppository 650 mg  650 mg Rectal Q6H PRN Isaiah Blakes, PA-C   650 mg at 12/31/17 1730  . acetaminophen (TYLENOL) tablet 650 mg  650 mg Oral Q6H PRN Raymon Mutton S, PA-C      . carbidopa-levodopa (SINEMET IR) 25-100 MG per tablet immediate release 1 tablet  1 tablet Oral TID Isaiah Blakes, PA-C   1 tablet at 01/03/18 1303  . finasteride (PROSCAR) tablet 5 mg  5 mg Oral Daily Isaiah Blakes, PA-C   5 mg at 01/03/18 8119  . guaifenesin (ROBITUSSIN) 100 MG/5ML syrup 200 mg  200 mg Oral TID PRN Raymon Mutton S, PA-C      . lactated ringers infusion   Intravenous Continuous Rhetta Mura, MD 50 mL/hr at 01/02/18 1055    . levothyroxine (SYNTHROID, LEVOTHROID) tablet 88 mcg  88 mcg Oral QAC breakfast Isaiah Blakes, [place]  88 mcg at 01/03/18 0944  . ondansetron (ZOFRAN) tablet 4 mg  4 mg Oral Q6H PRN Raymon MuttonKyazimova, Marina S, PA-C       Or  . ondansetron [facility](ZOFRAN) injection 4 mg  4 mg Intravenous Q6H PRN Raymon MuttonKyazimova, Marina S, PA-C      . piperacillin-tazobactam (ZOSYN) IVPB 3.375 g  3.375 g Intravenous Q8H Robertson, Crystal S, RPH 12.5 mL/hr at 01/03/18 0530 3.375 g at 01/03/18 0530  . polyethylene glycol (MIRALAX / GLYCOLAX) packet 17 g  17 g Oral Daily Isaiah BlakesKyazimova, Marina S, PA-C   17 g at 01/03/18 [id number]  . predniSONE (DELTASONE) tablet 40 mg  40 mg Oral QAC breakfast Rhetta MuraSamtani, Jai-Gurmukh, MD   40  mg at 01/03/18 0944  . rOPINIRole (REQUIP) tablet 5 mg  5 mg Oral QHS Isaiah BlakesKyazimova, Marina S, PA-C   5 mg at 01/02/18 2106  . saccharomyces boulardii (FLORASTOR) capsule 250 mg  250 mg Oral BID Isaiah BlakesKyazimova, Marina S, PA-C   250 mg at 01/03/18 0944  . tamsulosin (FLOMAX) capsule 0.4 mg  0.4 mg Oral Daily Raymon MuttonKyazimova, Marina S, PA-C   0.4 mg at 01/03/18 0944  . traZODone (DESYREL) tablet 100 mg  100 mg Oral QHS Rhetta MuraSamtani, Jai-Gurmukh, MD   100 mg at 01/02/18 2106     Discharge Medications: Please see discharge summary for a list of discharge medications.  Relevant Imaging Results:  Relevant Lab Results:   Additional Information SSN: [id number]  Mearl Latinadia S Teola Felipe, [place]

## 2018-01-03 NOTE — NC FL2 (Signed)
[place] MEDICAID FL2 LEVEL OF CARE SCREENING TOOL     IDENTIFICATION  Patient Name: Craig Giles Birthdate: 02/18/1931 Sex: male Admission Date (Current Location): 12/31/2017  [facility] and [place] Number:  Producer, television/film/video and Address:  [facility] [place], [address] [place], [place] [address]      Provider Number: [id number]  Attending Physician Name and Address:  Rhetta Mura, MD  Relative Name and Phone Number:       Current Level of Care: Hospital Recommended Level of Care: Assisted Living Facility Prior Approval Number:    Date Approved/Denied:   PASRR Number:    Discharge Plan: SNF    Current Diagnoses: Patient Active Problem List   Diagnosis Date Noted  . CAP (community acquired pneumonia) 12/31/2017  . Hypothyroidism 12/31/2017  . Dementia due to Parkinson's disease without behavioral disturbance (HCC)   . HCAP (healthcare-associated pneumonia)   . Generalized weakness 05/28/2017  . Indwelling Foley catheter present   . Rectal bleeding   . GI bleed 05/18/2017  . Pressure injury of skin 04/01/2017  . PNA (pneumonia) 03/31/2017  . Chronic insomnia 08/12/2016  . 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 Memory deficits 10/22/2013  . Abnormality of gait 11/10/2012  . Paralysis agitans (HCC) 11/10/2012    Orientation RESPIRATION BLADDER Height & Weight     Self, Place  Room Air Continent Weight: 165 lb 12.6 oz (75.2 kg) Height:     BEHAVIORAL SYMPTOMS/MOOD NEUROLOGICAL BOWEL NUTRITION STATUS      Incontinent (Please see d/c summary)  AMBULATORY STATUS COMMUNICATION OF NEEDS Skin   Extensive Assist Verbally Normal                       Personal Care Assistance Level of Assistance  Bathing, Feeding, Dressing Bathing Assistance: Maximum assistance Feeding assistance: Limited assistance Dressing Assistance: Maximum assistance     Functional Limitations Info  Sight, Hearing, Speech Sight Info:  Adequate Hearing Info: Adequate Speech Info: Adequate    SPECIAL CARE FACTORS FREQUENCY  PT (By licensed PT), OT (By licensed OT)     PT Frequency: 2x OT Frequency: 2x            Contractures      Additional Factors Info  Code Status, Allergies Code Status Info: Full Code Allergies Info: No known allergies           Current Medications (01/03/2018):  This is the current hospital active medication list Current Facility-Administered Medications  Medication Dose Route Frequency Provider Last Rate Last Dose  . acetaminophen (TYLENOL) suppository 650 mg  650 mg Rectal Q6H PRN Isaiah Blakes, PA-C   650 mg at 12/31/17 1730  . acetaminophen (TYLENOL) tablet 650 mg  650 mg Oral Q6H PRN Raymon Mutton S, PA-C      . carbidopa-levodopa (SINEMET IR) 25-100 MG per tablet immediate release 1 tablet  1 tablet Oral TID Isaiah Blakes, PA-C   1 tablet at 01/03/18 0944  . finasteride (PROSCAR) tablet 5 mg  5 mg Oral Daily Isaiah Blakes, PA-C   5 mg at 01/03/18 9562  . guaifenesin (ROBITUSSIN) 100 MG/5ML syrup 200 mg  200 mg Oral TID PRN Raymon Mutton S, PA-C      . lactated ringers infusion   Intravenous Continuous Rhetta Mura, MD 50 mL/hr at 01/02/18 1055    . levothyroxine (SYNTHROID, LEVOTHROID) tablet 88 mcg  88 mcg Oral QAC breakfast Isaiah Blakes, [place]   13  mcg at 01/03/18 0944  . ondansetron (ZOFRAN) tablet 4 mg  4 mg Oral Q6H PRN Raymon MuttonKyazimova, Marina S, PA-C       Or  . ondansetron [facility](ZOFRAN) injection 4 mg  4 mg Intravenous Q6H PRN Raymon MuttonKyazimova, Marina S, PA-C      . piperacillin-tazobactam (ZOSYN) IVPB 3.375 g  3.375 g Intravenous Q8H Robertson, Crystal S, RPH 12.5 mL/hr at 01/03/18 0530 3.375 g at 01/03/18 0530  . polyethylene glycol (MIRALAX / GLYCOLAX) packet 17 g  17 g Oral Daily Isaiah BlakesKyazimova, Marina S, PA-C   17 g at 01/03/18 [id number]  . predniSONE (DELTASONE) tablet 40 mg  40 mg Oral QAC breakfast Rhetta MuraSamtani, Jai-Gurmukh, MD   40 mg at 01/03/18 0944  .  rOPINIRole (REQUIP) tablet 5 mg  5 mg Oral QHS Isaiah BlakesKyazimova, Marina S, PA-C   5 mg at 01/02/18 2106  . saccharomyces boulardii (FLORASTOR) capsule 250 mg  250 mg Oral BID Isaiah BlakesKyazimova, Marina S, PA-C   250 mg at 01/03/18 0944  . tamsulosin (FLOMAX) capsule 0.4 mg  0.4 mg Oral Daily Raymon MuttonKyazimova, Marina S, PA-C   0.4 mg at 01/03/18 0944  . traZODone (DESYREL) tablet 100 mg  100 mg Oral QHS Rhetta MuraSamtani, Jai-Gurmukh, MD   100 mg at 01/02/18 2106     Discharge Medications: Please see discharge summary for a list of discharge medications.  Relevant Imaging Results:  Relevant Lab Results:   Additional Information SSN: [id number]  Maree KrabbeBridget A Arisbeth Purrington, LCSW

## 2018-01-03 NOTE — Progress Notes (Signed)
Removed his Nasal canula who was on 2L O2 and checked his sats after 20-25 min.  His O2 sat was 96% RA.  Notified the CM and the attending doctor.

## 2018-01-03 NOTE — Clinical Social Work Note (Signed)
/  Clinical Social Worker facilitated patient discharge including contacting patient family and facility to confirm patient discharge plans.  Clinical information faxed to facility and family agreeable with plan.  CSW arranged ambulance transport via [facility] to [facility].  RN to call [phone] for report prior to discharge.  Clinical Social Worker will sign off for now as social work intervention is no longer needed. Please consult us again if new need arises.  ValierBridget Miyonna [place], [place] [phone]

## 2018-01-03 NOTE — Clinical Social Work Note (Signed)
CSW spoke with Craig Giles at [facility]. Given pt's baseline being wheelchair dependent she is comfortable for pt to return to facility today. CSW faxed appropriate documents at this time.   PocahontasBridget Kinzley [place], [place] [phone]

## 2018-01-03 NOTE — Progress Notes (Signed)
Patient Discharge: Disposition: Patient discharged to [facility]. Given report to the nurse at the facility and answered all her questions. IV: Discontinued IV before discharge x2. Transportation: [facility]. Belongings: Patient took all his belongings with him.

## 2018-01-03 NOTE — Progress Notes (Addendum)
NCM was notified that patient is going to ALF and not SNF.  Patient will need oxygen.  RN, to check at rest sats and will need DME order also if qualifies.  Per CSW [facility] is going [facility]. NCM notified by RN, that patient does not need oxygen, his sats at rest is 96% RA.  NCM notified CSW.

## 2018-01-03 NOTE — Discharge Summary (Signed)
Physician Discharge Summary  Craig Giles BTD:[id number] DOB: 84 DOA: 12/31/2017  [facility]P: [facility] [place] date: 12/31/2017 Discharge date: 01/03/2018  Time spent: 25 minutes  Recommendations for Outpatient Follow-up:  1. Should complete Augmentin for presumed aspiration pneumonia 01/09/18 2. Needs CBC plus differential and be met in about 1 week 3. Needs chest x-ray in 1 month 4. Recommend outpatient speech evaluation at some point in the near future and patient should be seated 60 degrees at all times when eating and at least an hour after eating  Discharge Diagnoses:  Principal Problem:   CAP (community acquired pneumonia) Active Problems:   Paralysis agitans ([place])   Memory deficits   Sleep behavior disorder, REM   Hypothyroidism   Dementia due to Parkinson's disease without behavioral disturbance ([place])   HCAP (healthcare-associated pneumonia)   Discharge Condition: Improved  Diet recommendation: Heart healthy  Filed Weights   12/31/17 1333 01/01/18 2040 01/02/18 2103  Weight: 62.6 kg (138 lb) 63.5 kg (140 lb) 75.2 kg (165 lb 12.6 oz)    History of present illness:  84 [facility] resident hypothyoid, PD, chr Urinary retention, dementia-prior MMSE 12/2015 30/30, prior rectal bleed-diverticular/hemorrhoid, REM sleep disorder admit 12/31/17 with AMS as well as possible aspiration PNA    Hospital Course:  Aspiration PNA-grad by SLP to reg diet-narrow vanc to only zosyn n-wbc 11-->10.  Cont saline LR 100cc/h-->50 cc/h--narrow off solu-medrol to prednisone  and d/c on 01/03/18 and placed on Augmentin to stop on 01/09/18--need cxr 4 weeks and cbc 1 week--would recommend OP SLP to follow at ALF AMS-2/2 sepsis-hold sedating agent Trazadone 100, Tramadol 50 q 6--will resume nightly trazadone-got confused 2/1 am and might be 2/2 poor sleep-stabilizing and orienting well Parkinsons on meds-cont sinement 1 tid as able and Requip 5 qd CHr Urinary  retention -cont flomax 0.4 and finasteride 5 daily--prior  on suppressive therapy-stopped Hypothyroid-cont synthroid 78 mcg daily--Needs OP TSH 2-3 weeks   Procedures: none   Consultations:  none  Discharge Exam: Vitals:   01/03/18 0457 01/03/18 0747  BP: 123/69 130/74  Pulse: (!) 51 60  Resp: 17 18  Temp: 98.2 F (36.8 C) 98.5 F (36.9 C)  SpO2: 98% 99%    General: awake alert a little altered but clears as the days go on Cardiovascular: s1 s2 no m/r/g-rrr Respiratory: clear no added sound Moving 4 limbs equally with no deficit  Discharge Instructions   Discharge Instructions    Diet - low sodium heart healthy   Complete by:  As directed    Increase activity slowly   Complete by:  As directed      Allergies as of 01/03/2018   No Known Allergies     Medication List    STOP taking these medications   cephALEXin 500 MG capsule Commonly known as:  KEFLEX   ciprofloxacin 250 MG tablet Commonly known as:  CIPRO   nitrofurantoin 50 MG capsule Commonly known as:  MACRODANTIN     TAKE these medications   acetaminophen 325 MG tablet Commonly known as:  TYLENOL Take 650 mg by mouth every 6 (six) hours as needed for mild pain, moderate pain, fever or headache.   amoxicillin-clavulanate 875-125 MG tablet Commonly known as:  AUGMENTIN Take 1 tablet by mouth 2 (two) times daily for 6 days.   carbidopa-levodopa 25-100 MG tablet Commonly known as:  SINEMET IR Take 1 tablet by mouth 3 (three) times daily.   DERMACLOUD Crea Apply 1 application topically 3 (  three) times daily. Apply to buttocks and perineal area with brief changes   finasteride 5 MG tablet Commonly known as:  PROSCAR Take 5 mg by mouth daily.   levothyroxine 88 MCG tablet Commonly known as:  SYNTHROID, LEVOTHROID Take 88 mcg by mouth daily before breakfast.   loperamide 2 MG tablet Commonly known as:  IMODIUM A-D Take 2 mg by mouth 4 (four) times daily as needed for diarrhea or loose  stools.   Medical Compression Stockings Misc 1 Package by Other route daily. Apply in the morning and take off in the afternoon   [place] 200-200-20 MG/5ML suspension Generic drug:  alum & mag hydroxide-simeth Take 30 mLs by mouth every 6 (six) hours as needed for indigestion or heartburn.   multivitamin with minerals Tabs tablet Take 1 tablet by mouth daily.   polyethylene glycol packet Commonly known as:  MIRALAX / GLYCOLAX Take 17 g by mouth daily.   ROBAFEN 100 MG/5ML syrup Generic drug:  guaifenesin Take 200 mg by mouth 3 (three) times daily as needed for cough.   ropinirole 5 MG tablet Commonly known as:  REQUIP Take 5 mg by mouth at bedtime.   saccharomyces boulardii 250 MG capsule Commonly known as:  FLORASTOR Take 250 mg by mouth daily.   tamsulosin 0.4 MG Caps capsule Commonly known as:  FLOMAX Take 0.4 mg by mouth daily.   traZODone 100 MG tablet Commonly known as:  DESYREL Take 1 tablet (100 mg total) by mouth at bedtime.   vitamin C 250 MG tablet Commonly known as:  ASCORBIC ACID Take 250 mg by mouth daily.   zinc sulfate 220 (50 Zn) MG capsule Take 220 mg by mouth daily.      No Known Allergies    The results of significant diagnostics from this hospitalization (including imaging, microbiology, ancillary and laboratory) are listed below for reference.    Significant Diagnostic Studies: Dg Chest Portable 1 View  Result Date: 12/31/2017 CLINICAL DATA:  Altered mental status beginning today. EXAM: PORTABLE CHEST 1 VIEW COMPARISON:  10/17/2017.  03/31/2017.  04/01/2017. FINDINGS: The heart is enlarged. There is atherosclerosis and tortuosity of the thoracic aorta. There is patchy infiltrate and volume loss in both lower lobes left worse than right consistent with pneumonia. Upper lungs are clear. No sign of heart failure or effusion. IMPRESSION: Atelectasis and infiltrate in both lower lobes left worse than right. Basilar pneumonia suspected,  particularly on the left. Electronically Signed   By: Nelson Chimes M.D.   On: 12/31/2017 15:06   Dg Swallowing Func-speech Pathology  Result Date: 01/01/2018 Objective Swallowing Evaluation: Type of Study: MBS-Modified Barium Swallow Study  Patient Details Name: Craig Giles MRN: [id number] Date of Birth: 08-03-1931 Today's Date: 01/01/2018 Time: SLP Start Time (ACUTE ONLY): 1340 -SLP Stop Time (ACUTE ONLY): 1400 SLP Time Calculation (min) (ACUTE ONLY): 20 min Past Medical History: Past Medical History: Diagnosis Date . Hypothyroidism  . Memory deficits 10/22/2013 . Parkinson disease ([place])  . Pneumonia   "this is his 2nd time" (12/31/2017) . Sleep behavior disorder, REM 12/21/2015 Past Surgical History: Past Surgical History: Procedure Laterality Date . CATARACT EXTRACTION W/ INTRAOCULAR LENS  IMPLANT, BILATERAL Bilateral  . TONSILLECTOMY   HPI: Janard Caligiureis a 84 y.o.malewith a Past Medical History of Parkinson's and dementia, who presents with AMS.   No Data Recorded Assessment / Plan / Recommendation CHL IP CLINICAL IMPRESSIONS 01/01/2018 Clinical Impression Pt demonstrates swallow function within normal limits in setting of severe curvature of spine and horizontal  position of pharynx. Oral phase, timing and strength of swallow are all WNL. Trace flash and sensed penetration events were observed due to vulnerability of airway position during the swallow. But given good strength and adequate sensation and airway protection aspiration risk is still minimal. Recommend pt continue diet of choice. No SLP f/u needed for swallowing.  SLP Visit Diagnosis Dysphagia, unspecified (R13.10) Attention and concentration deficit following -- Frontal lobe and executive function deficit following -- Impact on safety and function Mild aspiration risk   CHL IP TREATMENT RECOMMENDATION 01/01/2018 Treatment Recommendations No treatment recommended at this time   Prognosis 01/01/2018 Prognosis for Safe Diet Advancement Fair  Barriers to Reach Goals Cognitive deficits Barriers/Prognosis Comment -- CHL IP DIET RECOMMENDATION 01/01/2018 SLP Diet Recommendations Regular solids;Thin liquid Liquid Administration via Cup;Straw Medication Administration Whole meds with liquid Compensations Slow rate;Small sips/bites Postural Changes --   CHL IP OTHER RECOMMENDATIONS 01/01/2018 Recommended Consults -- Oral Care Recommendations Oral care BID Other Recommendations --   CHL IP FOLLOW UP RECOMMENDATIONS 01/01/2018 Follow up Recommendations Skilled Nursing facility   [facility] IP FREQUENCY AND DURATION 01/01/2018 Speech Therapy Frequency (ACUTE ONLY) min 1 x/week Treatment Duration --      CHL IP ORAL PHASE 01/01/2018 Oral Phase WFL Oral - Pudding Teaspoon -- Oral - Pudding Cup -- Oral - Honey Teaspoon -- Oral - Honey Cup -- Oral - Nectar Teaspoon -- Oral - Nectar Cup -- Oral - Nectar Straw -- Oral - Thin Teaspoon -- Oral - Thin Cup -- Oral - Thin Straw -- Oral - Puree -- Oral - Mech Soft -- Oral - Regular -- Oral - Multi-Consistency -- Oral - Pill -- Oral Phase - Comment --  CHL IP PHARYNGEAL PHASE 01/01/2018 Pharyngeal Phase WFL Pharyngeal- Pudding Teaspoon -- Pharyngeal -- Pharyngeal- Pudding Cup -- Pharyngeal -- Pharyngeal- Honey Teaspoon -- Pharyngeal -- Pharyngeal- Honey Cup -- Pharyngeal -- Pharyngeal- Nectar Teaspoon -- Pharyngeal -- Pharyngeal- Nectar Cup -- Pharyngeal -- Pharyngeal- Nectar Straw -- Pharyngeal -- Pharyngeal- Thin Teaspoon -- Pharyngeal -- Pharyngeal- Thin Cup -- Pharyngeal -- Pharyngeal- Thin Straw -- Pharyngeal -- Pharyngeal- Puree -- Pharyngeal -- Pharyngeal- Mechanical Soft -- Pharyngeal -- Pharyngeal- Regular -- Pharyngeal -- Pharyngeal- Multi-consistency -- Pharyngeal -- Pharyngeal- Pill -- Pharyngeal -- Pharyngeal Comment --  CHL IP CERVICAL ESOPHAGEAL PHASE 01/01/2018 Cervical Esophageal Phase WFL Pudding Teaspoon -- Pudding Cup -- Honey Teaspoon -- Honey Cup -- Nectar Teaspoon -- Nectar Cup -- Nectar Straw -- Thin Teaspoon --  Thin Cup -- Thin Straw -- Puree -- Mechanical Soft -- Regular -- Multi-consistency -- Pill -- Cervical Esophageal Comment -- No flowsheet data found. Herbie [place], [place] CCC-SLP [phone] [place], Katherene Ponto 01/01/2018, 2:35 PM               Microbiology: Recent Results (from the past 240 hour(s))  Culture, blood (x 2)     Status: None (Preliminary result)   Collection Time: 12/31/17  8:37 PM  Result Value Ref Range Status   Specimen Description BLOOD RIGHT HAND  Final   Special Requests   Final    BOTTLES DRAWN AEROBIC AND ANAEROBIC Blood Culture adequate volume   Culture   Final    NO GROWTH 2 DAYS Performed at [facility], [place] [address], [place], [place] [address]    Report Status PENDING  Incomplete  Culture, blood (x 2)     Status: None (Preliminary result)   Collection Time: 12/31/17  8:46 PM  Result Value Ref Range Status  Specimen Description BLOOD LEFT HAND  Final   Special Requests   Final    BOTTLES DRAWN AEROBIC ONLY Blood Culture adequate volume   Culture   Final    NO GROWTH 2 DAYS Performed at [facility], [address], [place], [place] [address]    Report Status PENDING  Incomplete  MRSA PCR Screening     Status: None   Collection Time: 12/31/17 11:52 PM  Result Value Ref Range Status   MRSA by PCR NEGATIVE NEGATIVE Final    Comment:        The GeneXpert MRSA Assay (FDA approved for NASAL specimens only), is one component of a comprehensive MRSA colonization surveillance program. It is not intended to diagnose MRSA infection nor to guide or monitor treatment for MRSA infections.      Labs: Basic Metabolic Panel: Recent Labs  Lab 12/31/17 1418 12/31/17 1747 01/01/18 0706 01/02/18 0453 01/03/18 0628  NA 138  --  137 138 140  K 4.6  --  4.2 3.9 4.0  CL 105  --  106 108 108  CO2 22  --  _0 GLUCOSE 105*  --  140* 114* 81  BUN 25*  --  19 23* 20  CREATININE 1.39* 1.27* 1.08 1.25* 1.21  CALCIUM 9.3  --  9.1 8.9 8.8*   PHOS  --   --   --  2.5  --    Liver Function Tests: Recent Labs  Lab 12/31/17 1418 01/02/18 0453 01/03/18 0628  AST 33  --  26  ALT 6*  --  7*  ALKPHOS 100  --  76  BILITOT 1.7*  --  1.1  PROT 6.7  --  5.6*  ALBUMIN 3.6 2.8* 2.7*   No results for input(s): LIPASE, AMYLASE in the last 168 hours. No results for input(s): AMMONIA in the last 168 hours. CBC: Recent Labs  Lab 12/31/17 1418 12/31/17 1747 01/01/18 0706 01/02/18 0453 01/03/18 0628  WBC 11.7* 10.5 6.4 10.7* 6.7  NEUTROABS 9.6*  --   --  9.5* 4.9  HGB 12.7* 11.9* 11.9* 10.9* 11.5*  HCT 39.7 37.2* 36.7* 33.7* 35.8*  MCV 91.5 91.0 90.4 89.4 90.9  PLT 165 160 151 165 190   Cardiac Enzymes: No results for input(s): CKTOTAL, CKMB, CKMBINDEX, TROPONINI in the last 168 hours. BNP: BNP (last 3 results) Recent Labs    03/31/17 1150  BNP 246.1*    ProBNP (last 3 results) No results for input(s): PROBNP in the last 8760 hours.  CBG: Recent Labs  Lab 01/01/18 0430 01/01/18 0756 01/01/18 1227 01/01/18 1639 01/01/18 2037  GLUCAP 121* 132* 184* 127* 143*       Signed:  Nita Sells MD   [facility] 01/03/2018, 9:18 AM

## 2018-01-05 LAB — CULTURE, BLOOD (ROUTINE X 2)
CULTURE: NO GROWTH
CULTURE: NO GROWTH
Special Requests: ADEQUATE
Special Requests: ADEQUATE

## 2018-01-06 DIAGNOSIS — J690 Pneumonitis due to inhalation of food and vomit: Secondary | ICD-10-CM | POA: Diagnosis not present

## 2018-01-06 DIAGNOSIS — G20 Parkinson's disease: Secondary | ICD-10-CM | POA: Diagnosis not present

## 2018-01-06 DIAGNOSIS — E038 Other specified hypothyroidism: Secondary | ICD-10-CM | POA: Diagnosis not present

## 2018-01-06 DIAGNOSIS — R1319 Other dysphagia: Secondary | ICD-10-CM | POA: Diagnosis not present

## 2018-01-07 DIAGNOSIS — R531 Weakness: Secondary | ICD-10-CM | POA: Diagnosis not present

## 2018-01-09 DIAGNOSIS — D631 Anemia in chronic kidney disease: Secondary | ICD-10-CM | POA: Diagnosis not present

## 2018-01-09 DIAGNOSIS — Z8701 Personal history of pneumonia (recurrent): Secondary | ICD-10-CM | POA: Diagnosis not present

## 2018-01-09 DIAGNOSIS — N189 Chronic kidney disease, unspecified: Secondary | ICD-10-CM | POA: Diagnosis not present

## 2018-01-09 DIAGNOSIS — K5900 Constipation, unspecified: Secondary | ICD-10-CM | POA: Diagnosis not present

## 2018-01-09 DIAGNOSIS — N3941 Urge incontinence: Secondary | ICD-10-CM | POA: Diagnosis not present

## 2018-01-09 DIAGNOSIS — N200 Calculus of kidney: Secondary | ICD-10-CM | POA: Diagnosis not present

## 2018-01-09 DIAGNOSIS — F0280 Dementia in other diseases classified elsewhere without behavioral disturbance: Secondary | ICD-10-CM | POA: Diagnosis not present

## 2018-01-09 DIAGNOSIS — E039 Hypothyroidism, unspecified: Secondary | ICD-10-CM | POA: Diagnosis not present

## 2018-01-09 DIAGNOSIS — R1311 Dysphagia, oral phase: Secondary | ICD-10-CM | POA: Diagnosis not present

## 2018-01-09 DIAGNOSIS — G20 Parkinson's disease: Secondary | ICD-10-CM | POA: Diagnosis not present

## 2018-01-09 DIAGNOSIS — G2581 Restless legs syndrome: Secondary | ICD-10-CM | POA: Diagnosis not present

## 2018-01-09 DIAGNOSIS — N401 Enlarged prostate with lower urinary tract symptoms: Secondary | ICD-10-CM | POA: Diagnosis not present

## 2018-01-12 DIAGNOSIS — Z79899 Other long term (current) drug therapy: Secondary | ICD-10-CM | POA: Diagnosis not present

## 2018-01-13 DIAGNOSIS — M6281 Muscle weakness (generalized): Secondary | ICD-10-CM | POA: Diagnosis not present

## 2018-01-15 DIAGNOSIS — R531 Weakness: Secondary | ICD-10-CM | POA: Diagnosis not present

## 2018-01-21 DIAGNOSIS — B351 Tinea unguium: Secondary | ICD-10-CM | POA: Diagnosis not present

## 2018-01-21 DIAGNOSIS — I739 Peripheral vascular disease, unspecified: Secondary | ICD-10-CM | POA: Diagnosis not present

## 2018-01-27 DIAGNOSIS — F3289 Other specified depressive episodes: Secondary | ICD-10-CM | POA: Diagnosis not present

## 2018-01-27 DIAGNOSIS — G20 Parkinson's disease: Secondary | ICD-10-CM | POA: Diagnosis not present

## 2018-01-27 DIAGNOSIS — R609 Edema, unspecified: Secondary | ICD-10-CM | POA: Diagnosis not present

## 2018-01-27 DIAGNOSIS — J188 Other pneumonia, unspecified organism: Secondary | ICD-10-CM | POA: Diagnosis not present

## 2018-02-03 DIAGNOSIS — E038 Other specified hypothyroidism: Secondary | ICD-10-CM | POA: Diagnosis not present

## 2018-02-03 DIAGNOSIS — J690 Pneumonitis due to inhalation of food and vomit: Secondary | ICD-10-CM | POA: Diagnosis not present

## 2018-02-03 DIAGNOSIS — R05 Cough: Secondary | ICD-10-CM | POA: Diagnosis not present

## 2018-02-03 DIAGNOSIS — G20 Parkinson's disease: Secondary | ICD-10-CM | POA: Diagnosis not present

## 2018-02-03 DIAGNOSIS — F3289 Other specified depressive episodes: Secondary | ICD-10-CM | POA: Diagnosis not present

## 2018-02-26 DIAGNOSIS — R531 Weakness: Secondary | ICD-10-CM | POA: Diagnosis not present

## 2018-03-11 DIAGNOSIS — R531 Weakness: Secondary | ICD-10-CM | POA: Diagnosis not present

## 2018-03-17 DIAGNOSIS — E039 Hypothyroidism, unspecified: Secondary | ICD-10-CM | POA: Diagnosis not present

## 2018-03-17 DIAGNOSIS — Z79899 Other long term (current) drug therapy: Secondary | ICD-10-CM | POA: Diagnosis not present

## 2018-03-17 DIAGNOSIS — E038 Other specified hypothyroidism: Secondary | ICD-10-CM | POA: Diagnosis not present

## 2018-03-25 DIAGNOSIS — B351 Tinea unguium: Secondary | ICD-10-CM | POA: Diagnosis not present

## 2018-03-25 DIAGNOSIS — I739 Peripheral vascular disease, unspecified: Secondary | ICD-10-CM | POA: Diagnosis not present

## 2018-04-07 DIAGNOSIS — G20 Parkinson's disease: Secondary | ICD-10-CM | POA: Diagnosis not present

## 2018-04-07 DIAGNOSIS — E038 Other specified hypothyroidism: Secondary | ICD-10-CM | POA: Diagnosis not present

## 2018-04-07 DIAGNOSIS — N401 Enlarged prostate with lower urinary tract symptoms: Secondary | ICD-10-CM | POA: Diagnosis not present

## 2018-04-07 DIAGNOSIS — R350 Frequency of micturition: Secondary | ICD-10-CM | POA: Diagnosis not present

## 2018-04-07 DIAGNOSIS — R300 Dysuria: Secondary | ICD-10-CM | POA: Diagnosis not present

## 2018-04-14 DIAGNOSIS — N401 Enlarged prostate with lower urinary tract symptoms: Secondary | ICD-10-CM | POA: Diagnosis not present

## 2018-04-14 DIAGNOSIS — N390 Urinary tract infection, site not specified: Secondary | ICD-10-CM | POA: Diagnosis not present

## 2018-04-14 DIAGNOSIS — E038 Other specified hypothyroidism: Secondary | ICD-10-CM | POA: Diagnosis not present

## 2018-04-14 DIAGNOSIS — G20 Parkinson's disease: Secondary | ICD-10-CM | POA: Diagnosis not present

## 2018-04-17 ENCOUNTER — Non-Acute Institutional Stay: Payer: Medicare Other | Admitting: Hospice and Palliative Medicine

## 2018-04-17 DIAGNOSIS — Z515 Encounter for palliative care: Secondary | ICD-10-CM | POA: Diagnosis not present

## 2018-04-17 NOTE — Progress Notes (Signed)
  PALLIATIVE CARE CONSULT VISIT   PATIENT NAME: Craig Giles DOB: 12/15/1930 MRN: [id number]  PRIMARY CARE PROVIDER: [facility]  REFERRING PROVIDER: Maggie Mazurek, NP [facility]  RESPONSIBLE PARTY:   spouse   RECOMMENDATIONS and PLAN:  1.Weakness, secondary to advancing age and Parkinson's with dementia. He resides in the AL setting at [facility]. He needs to work with PT ongoing and a referral has been placed by his primary NP. Patient anxiously awaits this.  He does not feel like visiting with me. He doesn't think he needs monitoring. He requests that I not interfere in case a PT might come today. He seems to be doing well. He is alert today, no congestion noted. No known weight loss or recent falls. He seems irritated today. His wife is at his side, also a resident there now for a couple of weeks. He seems irritated that she will be returning home. 2. ACP: met with patient and his son to discuss ACP. Son wants patient to be a full code, including intubation. Patient went along with what his son desired. Patient's wife did not want to make any decisions for him in this area. Education provided on how difficult surviving a code could be, but both were irritated with the conversation. Both reminded me that neither of them believed in God and did not feel that anything happened after the body died.   I spent 20 minutes providing this consultation,  from 3:45pm to 4:05pm. More than 50% of the time in this consultation was spent interviewing patient and wife, assessing patient and coordinating communication.   HISTORY OF PRESENT ILLNESS:  Craig Giles is a 84 y.o.  male with multiple medical problems.  Palliative Care was asked to help address any symptom management needs and goals of care. Today's visit was routine to assess for any decline.   CODE STATUS: full code  PPS: 30% HOSPICE ELIGIBILITY/DIAGNOSIS: not at this time  PAST MEDICAL HISTORY:  Past Medical  History:  Diagnosis Date  . Hypothyroidism   . Memory deficits 10/22/2013  . Parkinson disease ([facility])   . Pneumonia    "this is his 2nd time" (12/31/2017)  . 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 Smoking status: Never Smoker  . Smokeless tobacco: Never Used  Substance Use Topics  . Alcohol use: No    ALLERGIES: No Known Allergies   PERTINENT MEDICATIONS:  Outpatient Encounter Medications as of 04/17/2018  Medication Sig  . acetaminophen (TYLENOL) 325 MG tablet Take 650 mg by mouth every 6 (six) hours as needed for mild pain, moderate pain, fever or headache.   . alum & mag hydroxide-simeth (MINTOX) 200-200-20 MG/5ML suspension Take 30 mLs by mouth every 6 (six) hours as needed for indigestion or heartburn.  . carbidopa-levodopa (SINEMET IR) 25-100 MG tablet Take 1 tablet by mouth 3 (three) times daily.  . Elastic Bandages & Supports (MEDICAL COMPRESSION STOCKINGS) MISC 1 Package by Other route daily. Apply in the morning and take off in the afternoon  . finasteride (PROSCAR) 5 MG tablet Take 5 mg by mouth daily.  . guaifenesin (ROBAFEN) 100 MG/5ML syrup Take 200 mg by mouth 3 (three) times daily as needed for cough.  . Infant Care Products (DERMACLOUD) CREA Apply 1 application topically 3 (three) times daily. Apply to buttocks and perineal area with brief changes  . levothyroxine (SYNTHROID, LEVOTHROID) 88 MCG tablet Take 88 mcg by mouth daily before   breakfast.   . loperamide (IMODIUM A-D) 2 MG tablet Take 2 mg by mouth 4 (four) times daily as needed for diarrhea or loose stools.  . Multiple Vitamin (MULTIVITAMIN WITH MINERALS) TABS tablet Take 1 tablet by mouth daily.  . polyethylene glycol (MIRALAX / GLYCOLAX) packet Take 17 g by mouth daily.   . ropinirole (REQUIP) 5 MG tablet Take 5 mg by mouth at bedtime.   . saccharomyces boulardii (FLORASTOR) 250 MG capsule Take 250 mg by mouth daily.  . tamsulosin (FLOMAX) 0.4 MG CAPS capsule  Take 0.4 mg by mouth daily.  . traZODone (DESYREL) 100 MG tablet Take 1 tablet (100 mg total) by mouth at bedtime.  . vitamin C (ASCORBIC ACID) 250 MG tablet Take 250 mg by mouth daily.  . zinc sulfate 220 (50 Zn) MG capsule Take 220 mg by mouth daily.   No facility-administered encounter medications on file as of 04/17/2018.     PHYSICAL EXAM:   General: Small, elderly male with Parkinson's sitting in recliner in NAD Cardiovascular: irreg rhythm; reg rate Pulmonary: diminished in bases; breathing easily; no cough; Abdomen: soft, active BS, NTTP Extremities: weak legs Skin: thin, fragile Neurological: + memory loss...mild...holds limited conversation. Word searches; + tremors; + generalized weakness   Colleen , NP   

## 2018-05-08 DIAGNOSIS — G20 Parkinson's disease: Secondary | ICD-10-CM | POA: Diagnosis not present

## 2018-05-08 DIAGNOSIS — Z993 Dependence on wheelchair: Secondary | ICD-10-CM | POA: Diagnosis not present

## 2018-05-08 DIAGNOSIS — Z9181 History of falling: Secondary | ICD-10-CM | POA: Diagnosis not present

## 2018-05-26 DIAGNOSIS — Z79899 Other long term (current) drug therapy: Secondary | ICD-10-CM | POA: Diagnosis not present

## 2018-06-03 DIAGNOSIS — B351 Tinea unguium: Secondary | ICD-10-CM | POA: Diagnosis not present

## 2018-06-03 DIAGNOSIS — R600 Localized edema: Secondary | ICD-10-CM | POA: Diagnosis not present

## 2018-06-03 DIAGNOSIS — I739 Peripheral vascular disease, unspecified: Secondary | ICD-10-CM | POA: Diagnosis not present

## 2018-06-11 DIAGNOSIS — I959 Hypotension, unspecified: Secondary | ICD-10-CM | POA: Diagnosis not present

## 2018-06-11 DIAGNOSIS — F0280 Dementia in other diseases classified elsewhere without behavioral disturbance: Secondary | ICD-10-CM | POA: Diagnosis not present

## 2018-06-11 DIAGNOSIS — R269 Unspecified abnormalities of gait and mobility: Secondary | ICD-10-CM | POA: Diagnosis not present

## 2018-06-11 DIAGNOSIS — G20 Parkinson's disease: Secondary | ICD-10-CM | POA: Diagnosis not present

## 2018-06-30 DIAGNOSIS — N401 Enlarged prostate with lower urinary tract symptoms: Secondary | ICD-10-CM | POA: Diagnosis not present

## 2018-06-30 DIAGNOSIS — G20 Parkinson's disease: Secondary | ICD-10-CM | POA: Diagnosis not present

## 2018-06-30 DIAGNOSIS — R269 Unspecified abnormalities of gait and mobility: Secondary | ICD-10-CM | POA: Diagnosis not present

## 2018-06-30 DIAGNOSIS — R531 Weakness: Secondary | ICD-10-CM | POA: Diagnosis not present

## 2018-07-07 DIAGNOSIS — G20 Parkinson's disease: Secondary | ICD-10-CM | POA: Diagnosis not present

## 2018-07-07 DIAGNOSIS — Z993 Dependence on wheelchair: Secondary | ICD-10-CM | POA: Diagnosis not present

## 2018-07-07 DIAGNOSIS — Z9181 History of falling: Secondary | ICD-10-CM | POA: Diagnosis not present

## 2018-07-07 DIAGNOSIS — Z79899 Other long term (current) drug therapy: Secondary | ICD-10-CM | POA: Diagnosis not present

## 2018-07-10 DIAGNOSIS — Z993 Dependence on wheelchair: Secondary | ICD-10-CM | POA: Diagnosis not present

## 2018-07-10 DIAGNOSIS — Z9181 History of falling: Secondary | ICD-10-CM | POA: Diagnosis not present

## 2018-07-10 DIAGNOSIS — G20 Parkinson's disease: Secondary | ICD-10-CM | POA: Diagnosis not present

## 2018-07-15 ENCOUNTER — Non-Acute Institutional Stay: Payer: Medicare Other | Admitting: Nurse Practitioner

## 2018-07-15 ENCOUNTER — Encounter: Payer: Self-pay | Admitting: Nurse Practitioner

## 2018-07-15 DIAGNOSIS — F02818 Dementia in other diseases classified elsewhere, unspecified severity, with other behavioral disturbance: Secondary | ICD-10-CM

## 2018-07-15 DIAGNOSIS — R269 Unspecified abnormalities of gait and mobility: Secondary | ICD-10-CM | POA: Diagnosis not present

## 2018-07-15 DIAGNOSIS — G20 Parkinson's disease: Secondary | ICD-10-CM

## 2018-07-15 DIAGNOSIS — Z515 Encounter for palliative care: Secondary | ICD-10-CM

## 2018-07-15 DIAGNOSIS — W19XXXS Unspecified fall, sequela: Secondary | ICD-10-CM

## 2018-07-15 DIAGNOSIS — G3183 Dementia with Lewy bodies: Secondary | ICD-10-CM

## 2018-07-15 DIAGNOSIS — F0281 Dementia in other diseases classified elsewhere with behavioral disturbance: Secondary | ICD-10-CM

## 2018-07-15 NOTE — Progress Notes (Signed)
PALLIATIVE CARE CONSULT VISIT   PATIENT NAME: Craig Giles DOB: 07/28/1931 MRN: [id number]  PRIMARY CARE PROVIDER:   [facility]  REFERRING PROVIDER:  [facility] [address] [place] [address]  RESPONSIBLE PARTY:  Verita LambRoseann Sutherland (wife) [phone] (C)     ASSESSMENT/RECOMMENDATIONS and PLAN:   Parkinson's disease Lewy-Body Dementia with behavioral disturbance Gait disturbance with frequent fall sleep disturbance -Followed by [name] (neurology) -sinemet 25/100 TID -severley kyphotic/leans to right in wheelchair -requip 5mg  qhs -trazodone 100mg  qhs  Chronic Medical problems Hypothyroidism BPH constipation -TSH 2.228 (9/18) -levothyroxine 88mcgs qd -flomax 0.4mg  Qd -miralax 17gms qd -saccharomces boulardii 250mg  qd -MVI qd    ACP -discussed again with patient and son. -desires Full Code status    I spent 30 minutes providing this consultation,  from 12:30 to 1:00pm. More than 50% of the time in this consultation was spent coordinating communication.   HISTORY OF PRESENT ILLNESS:  Craig DankerRaymond Keats is a 84 y.o. year old male with multiple medical problems including Parkinson's, lewy-body dementia, gait disturbance, frequent falls, hypothyroidism, BPH, sleep disturbance. Palliative Care was asked to follow and assist with symptom management; ongoing patient and family support and discuss goals of care.   CODE STATUS: FULL  PPS: 30% HOSPICE ELIGIBILITY/DIAGNOSIS: TBD  PAST MEDICAL HISTORY:  Past Medical History:  Diagnosis Date  . Hypothyroidism   . Memory deficits 10/22/2013  . Parkinson disease ([facility])   . Pneumonia    "this is his 2nd time" (12/31/2017)  . 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 Smoking status: Never Smoker  . Smokeless tobacco: Never Used  Substance Use Topics  . Alcohol  use: No    ALLERGIES: No Known Allergies   PERTINENT MEDICATIONS:  Outpatient Encounter Medications as of 07/15/2018  Medication Sig  . acetaminophen (TYLENOL) 325 MG tablet Take 650 mg by mouth every 6 (six) hours as needed for mild pain, moderate pain, fever or headache.   Marland Kitchen. alum & mag hydroxide-simeth (MINTOX) 200-200-20 MG/5ML suspension Take 30 mLs by mouth every 6 (six) hours as needed for indigestion or heartburn.  . carbidopa-levodopa (SINEMET IR) 25-100 MG tablet Take 1 tablet by mouth 3 (three) times daily.  Jae Dire. Elastic Bandages & Supports (MEDICAL COMPRESSION STOCKINGS) MISC 1 Package by Other route daily. Apply in the morning and take off in the afternoon  . finasteride (PROSCAR) 5 MG tablet Take 5 mg by mouth daily.  Marland Kitchen. guaifenesin (ROBAFEN) 100 MG/5ML syrup Take 200 mg by mouth 3 (three) times daily as needed for cough.  . Infant Care Products (DERMACLOUD) CREA Apply 1 application topically 3 (three) times daily. Apply to buttocks and perineal area with brief changes  . levothyroxine (SYNTHROID, LEVOTHROID) 88 MCG tablet Take 88 mcg by mouth daily before breakfast.   . loperamide (IMODIUM A-D) 2 MG tablet Take 2 mg by mouth 4 (four) times daily as needed for diarrhea or loose stools.  . Multiple Vitamin (MULTIVITAMIN WITH MINERALS) TABS tablet Take 1 tablet by mouth daily.  . polyethylene glycol (MIRALAX / GLYCOLAX) packet Take 17 g by mouth daily.   . ropinirole (REQUIP) 5 MG tablet Take 5 mg by mouth at bedtime.   . saccharomyces boulardii (FLORASTOR) 250 MG capsule Take 250 mg by mouth daily.  . tamsulosin (FLOMAX) 0.4 MG CAPS capsule Take 0.4 mg by mouth daily.  .Marland Kitchen  traZODone (DESYREL) 100 MG tablet Take 1 tablet (100 mg total) by mouth at bedtime.  . vitamin C (ASCORBIC ACID) 250 MG tablet Take 250 mg by mouth daily.  Marland Kitchen. zinc sulfate 220 (50 Zn) MG capsule Take 220 mg by mouth daily.   No facility-administered encounter medications on file as of 07/15/2018.     PHYSICAL EXAM:     General: NAD, WD/WN sitting in wheelchair Cardiovascular: regular rate and rhythm Pulmonary: clear ant fields Abdomen: soft, nontender, + bowel sounds GU: no suprapubic tenderness Extremities: no edema, no joint deformities; severe kyphosis Skin: no rashes Neurological: Weakness but otherwise nonfocal  Caitlyn Buchanan G [place], NP

## 2018-08-05 DIAGNOSIS — B351 Tinea unguium: Secondary | ICD-10-CM | POA: Diagnosis not present

## 2018-08-05 DIAGNOSIS — R600 Localized edema: Secondary | ICD-10-CM | POA: Diagnosis not present

## 2018-08-05 DIAGNOSIS — I739 Peripheral vascular disease, unspecified: Secondary | ICD-10-CM | POA: Diagnosis not present

## 2018-08-11 DIAGNOSIS — R4182 Altered mental status, unspecified: Secondary | ICD-10-CM | POA: Diagnosis not present

## 2018-08-11 DIAGNOSIS — G20 Parkinson's disease: Secondary | ICD-10-CM | POA: Diagnosis not present

## 2018-08-11 DIAGNOSIS — N401 Enlarged prostate with lower urinary tract symptoms: Secondary | ICD-10-CM | POA: Diagnosis not present

## 2018-08-11 DIAGNOSIS — L89891 Pressure ulcer of other site, stage 1: Secondary | ICD-10-CM | POA: Diagnosis not present

## 2018-08-12 DIAGNOSIS — Z79899 Other long term (current) drug therapy: Secondary | ICD-10-CM | POA: Diagnosis not present

## 2018-08-18 DIAGNOSIS — G20 Parkinson's disease: Secondary | ICD-10-CM | POA: Diagnosis not present

## 2018-08-18 DIAGNOSIS — R2681 Unsteadiness on feet: Secondary | ICD-10-CM | POA: Diagnosis not present

## 2018-08-18 DIAGNOSIS — J069 Acute upper respiratory infection, unspecified: Secondary | ICD-10-CM | POA: Diagnosis not present

## 2018-08-18 DIAGNOSIS — N390 Urinary tract infection, site not specified: Secondary | ICD-10-CM | POA: Diagnosis not present

## 2018-08-18 DIAGNOSIS — R05 Cough: Secondary | ICD-10-CM | POA: Diagnosis not present

## 2018-09-05 DIAGNOSIS — Z9181 History of falling: Secondary | ICD-10-CM | POA: Diagnosis not present

## 2018-09-05 DIAGNOSIS — G20 Parkinson's disease: Secondary | ICD-10-CM | POA: Diagnosis not present

## 2018-09-05 DIAGNOSIS — Z993 Dependence on wheelchair: Secondary | ICD-10-CM | POA: Diagnosis not present

## 2018-09-09 DIAGNOSIS — Z9181 History of falling: Secondary | ICD-10-CM | POA: Diagnosis not present

## 2018-09-09 DIAGNOSIS — G20 Parkinson's disease: Secondary | ICD-10-CM | POA: Diagnosis not present

## 2018-09-09 DIAGNOSIS — Z993 Dependence on wheelchair: Secondary | ICD-10-CM | POA: Diagnosis not present

## 2018-09-16 ENCOUNTER — Non-Acute Institutional Stay: Payer: Self-pay | Admitting: Nurse Practitioner

## 2018-09-16 DIAGNOSIS — R490 Dysphonia: Secondary | ICD-10-CM | POA: Diagnosis not present

## 2018-09-16 DIAGNOSIS — Z515 Encounter for palliative care: Secondary | ICD-10-CM | POA: Diagnosis not present

## 2018-09-16 DIAGNOSIS — R269 Unspecified abnormalities of gait and mobility: Secondary | ICD-10-CM | POA: Diagnosis not present

## 2018-09-16 DIAGNOSIS — G3183 Dementia with Lewy bodies: Secondary | ICD-10-CM | POA: Diagnosis not present

## 2018-09-17 ENCOUNTER — Encounter: Payer: Self-pay | Admitting: Nurse Practitioner

## 2018-09-17 NOTE — Progress Notes (Signed)
PALLIATIVE CARE CONSULT VISIT   PATIENT NAME: Craig Giles DOB: 1931/06/30 MRN: [id number]  PRIMARY CARE PROVIDER:   [facility]  REFERRING PROVIDER:  [facility], [facility] [address] [place], [place] [address]  RESPONSIBLE PARTY:  Zimir Kittleson (wife) [phone] (h) [phone] (C)   HISTORY OF PRESENT ILLNESS:  Craig Giles is a 84 y.o. year old male with multiple medical problems including Parkinson's, lewy-body dementia, gait disturbance, frequent falls, hypothyroidism, BPH, sleep disturbance. Palliative Care was asked to follow and assist with symptom management; ongoing patient and family support and discuss goals of care.   Interim history: patient notes voice change; has harsh raspy sound (most likely 2/2 parkinsons; otherwise patient reports no acute problems.   ASSESSMENT/RECOMMENDATIONS and PLAN:   Parkinson's disease -parkinson's related dysphonia Lewy-Body Dementia with behavioral disturbance Gait disturbance with frequent fall sleep disturbance -Followed by Dr. Anne Hahn (neurology) -sinemet 25/100 TID -severley kyphotic/leans to right in wheelchair -requip 5mg  qhs -trazodone 100mg  qhs  Chronic Medical problems Hypothyroidism BPH constipation -Managed by primary provider -levothyroxine qd -flomax 0.4mg  Qd -miralax 17gms qd -saccharomces boulardii 250mg  qd -MVI qd    ACP - Full Code status    I spent 30 minutes providing this consultation,  from 1:00 to 1:30pm. More than 50% of the time in this consultation was spent coordinating communication.    CODE STATUS: FULL  PPS: 30% HOSPICE ELIGIBILITY/DIAGNOSIS: TBD  PAST MEDICAL HISTORY:  Past Medical History:  Diagnosis Date  . Hypothyroidism   . Memory deficits 10/22/2013  . Parkinson disease (HCC)   . Pneumonia    "this is his 2nd time" (12/31/2017)  . 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 Smoking status: Never Smoker  . Smokeless tobacco: Never Used  Substance Use Topics  . Alcohol use: No    ALLERGIES: No Known Allergies   PERTINENT MEDICATIONS:  Outpatient Encounter Medications as of 09/16/2018  Medication Sig  . acetaminophen (TYLENOL) 325 MG tablet Take 650 mg by mouth every 6 (six) hours as needed for mild pain, moderate pain, fever or headache.   Marland Kitchen alum & mag hydroxide-simeth (MINTOX) 200-200-20 MG/5ML suspension Take 30 mLs by mouth every 6 (six) hours as needed for indigestion or heartburn.  . carbidopa-levodopa (SINEMET IR) 25-100 MG tablet Take 1 tablet by mouth 3 (three) times daily.  Jae Dire Bandages & Supports (MEDICAL COMPRESSION STOCKINGS) MISC 1 Package by Other route daily. Apply in the morning and take off in the afternoon  . finasteride (PROSCAR) 5 MG tablet Take 5 mg by mouth daily.  Marland Kitchen guaifenesin (ROBAFEN) 100 MG/5ML syrup Take 200 mg by mouth 3 (three) times daily as needed for cough.  . Infant Care Products (DERMACLOUD) CREA Apply 1 application topically 3 (three) times daily. Apply to buttocks and perineal area with brief changes  . levothyroxine (SYNTHROID, LEVOTHROID) 88 MCG tablet Take 88 mcg by mouth daily before breakfast.   . loperamide (IMODIUM A-D) 2 MG tablet Take 2 mg by mouth 4 (four) times daily as needed for diarrhea or loose stools.  . Multiple Vitamin (MULTIVITAMIN WITH MINERALS) TABS tablet Take 1 tablet by mouth daily.  . polyethylene glycol (MIRALAX / GLYCOLAX) packet Take 17 g by mouth daily.   . ropinirole (REQUIP) 5 MG tablet Take 5 mg by mouth at bedtime.   . saccharomyces boulardii (FLORASTOR) 250 MG capsule Take 250 mg  by mouth daily.  . tamsulosin (FLOMAX) 0.4 MG CAPS capsule Take 0.4 mg by mouth daily.  . traZODone (DESYREL) 100 MG tablet Take 1 tablet (100 mg total) by mouth at bedtime.  . vitamin C (ASCORBIC ACID) 250 MG tablet Take 250 mg by mouth daily.  Marland Kitchen zinc sulfate 220 (50  Zn) MG capsule Take 220 mg by mouth daily.   No facility-administered encounter medications on file as of 09/16/2018.     PHYSICAL EXAM:   General: NAD, WD/WN sitting in wheelchair; torticollis of neck Cardiovascular: regular rate and rhythm Pulmonary: clear ant fields Abdomen: soft, nontender, + bowel sounds GU: no suprapubic tenderness Extremities: 1+ bilateral lower ext edema, no joint deformities; severe kyphosis Skin: no rashes Neurological: Weakness but otherwise non-focal; fine tremor of hands right greater than left; patient's voice harsh and raspy(most likely due to parkinson's)  Craig Theroux G [place], NP

## 2018-10-07 DIAGNOSIS — R600 Localized edema: Secondary | ICD-10-CM | POA: Diagnosis not present

## 2018-10-07 DIAGNOSIS — L84 Corns and callosities: Secondary | ICD-10-CM | POA: Diagnosis not present

## 2018-10-07 DIAGNOSIS — B351 Tinea unguium: Secondary | ICD-10-CM | POA: Diagnosis not present

## 2018-10-07 DIAGNOSIS — I739 Peripheral vascular disease, unspecified: Secondary | ICD-10-CM | POA: Diagnosis not present

## 2018-11-17 ENCOUNTER — Non-Acute Institutional Stay: Payer: Medicare Other | Admitting: Nurse Practitioner

## 2018-11-17 DIAGNOSIS — G20 Parkinson's disease: Secondary | ICD-10-CM | POA: Diagnosis not present

## 2018-11-17 DIAGNOSIS — R1319 Other dysphagia: Secondary | ICD-10-CM | POA: Diagnosis not present

## 2018-11-17 DIAGNOSIS — E039 Hypothyroidism, unspecified: Secondary | ICD-10-CM | POA: Diagnosis not present

## 2018-11-17 DIAGNOSIS — Z515 Encounter for palliative care: Secondary | ICD-10-CM | POA: Diagnosis not present

## 2018-11-17 DIAGNOSIS — R269 Unspecified abnormalities of gait and mobility: Secondary | ICD-10-CM | POA: Diagnosis not present

## 2018-11-17 DIAGNOSIS — N401 Enlarged prostate with lower urinary tract symptoms: Secondary | ICD-10-CM | POA: Diagnosis not present

## 2018-11-17 DIAGNOSIS — W19XXXS Unspecified fall, sequela: Secondary | ICD-10-CM | POA: Diagnosis not present

## 2018-11-17 NOTE — Progress Notes (Signed)
PALLIATIVE CARE CONSULT VISIT   PATIENT NAME: Craig Giles DOB: 05/20/1931 MRN: [id number]  PRIMARY CARE PROVIDER:   [facility]  REFERRING PROVIDER:  [facility] [address] [place] [address]  RESPONSIBLE PARTY:  Verita LambRoseann Sidor (wife) [phone] (h) [phone] (C)   HISTORY OF PRESENT ILLNESS:  Craig Giles is a 84 y.o. year old male with multiple medical problems including Parkinson's, lewy-body dementia, gait disturbance, frequent falls, hypothyroidism, BPH, sleep disturbance. Palliative Care was asked to follow and assist with symptom management; ongoing patient and family support and discuss goals of care.   Interim history: no acute changes per staff   ASSESSMENT/RECOMMENDATIONS and PLAN:   Parkinson's disease -parkinson's related dysphonia Lewy-Body Dementia with behavioral disturbance Gait disturbance with frequent fall sleep disturbance -Followed by [name] (neurology) -sinemet 25/100 TID -severley kyphotic/leans to right in wheelchair -requip 5mg  qhs -trazodone 100mg  qhs  Chronic Medical problems Hypothyroidism BPH constipation -Managed by primary provider -levothyroxine 88mcgs qd -flomax 0.4mg  Qd -miralax 17gms qd -saccharomces boulardii 250mg  qd -MVI qd    ACP -DNR-Most-full scope;give antibiotic's ,IVF's and feeding tube   I spent 15 minutes providing this consultation,  from 10:30 to 10:45pm. More than 50% of the time in this consultation was spent coordinating communication.    CODE STATUS: DNR; full scope; give antibiotic's, IVF;s and tube feeding  PPS: 30% HOSPICE ELIGIBILITY/DIAGNOSIS: TBD  PAST MEDICAL HISTORY:  Past Medical History:  Diagnosis Date  . Hypothyroidism   . Memory deficits 10/22/2013  . Parkinson disease (HCC)   . Pneumonia    "this is his 2nd time" (12/31/2017)  . 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 Smoking status: Never Smoker  . Smokeless tobacco: Never Used  Substance Use Topics  . Alcohol use: No    ALLERGIES: No Known Allergies   PERTINENT MEDICATIONS:  Outpatient Encounter Medications as of 11/17/2018  Medication Sig  . acetaminophen (TYLENOL) 325 MG tablet Take 650 mg by mouth every 6 (six) hours as needed for mild pain, moderate pain, fever or headache.   Marland Kitchen. alum & mag hydroxide-simeth (MINTOX) 200-200-20 MG/5ML suspension Take 30 mLs by mouth every 6 (six) hours as needed for indigestion or heartburn.  . carbidopa-levodopa (SINEMET IR) 25-100 MG tablet Take 1 tablet by mouth 3 (three) times daily.  Jae Dire. Elastic Bandages & Supports (MEDICAL COMPRESSION STOCKINGS) MISC 1 Package by Other route daily. Apply in the morning and take off in the afternoon  . finasteride (PROSCAR) 5 MG tablet Take 5 mg by mouth daily.  Marland Kitchen. guaifenesin (ROBAFEN) 100 MG/5ML syrup Take 200 mg by mouth 3 (three) times daily as needed for cough.  . Infant Care Products (DERMACLOUD) CREA Apply 1 application topically 3 (three) times daily. Apply to buttocks and perineal area with brief changes  . levothyroxine (SYNTHROID, LEVOTHROID) 88 MCG tablet Take 88 mcg by mouth daily before breakfast.   . loperamide (IMODIUM A-D) 2 MG tablet Take 2 mg by mouth 4 (four) times daily as needed for diarrhea or loose stools.  . Multiple Vitamin (MULTIVITAMIN WITH MINERALS) TABS tablet Take 1 tablet by mouth daily.  . polyethylene glycol (MIRALAX / GLYCOLAX) packet Take 17 g by mouth daily.   . ropinirole (REQUIP) 5 MG tablet Take 5 mg by mouth at bedtime.   . saccharomyces boulardii (FLORASTOR) 250 MG capsule Take 250 mg by mouth daily.  .Marland Kitchen  tamsulosin (FLOMAX) 0.4 MG CAPS capsule Take 0.4 mg by mouth daily.  . traZODone (DESYREL) 100 MG tablet Take 1 tablet (100 mg total) by mouth at bedtime.  . vitamin C (ASCORBIC ACID) 250 MG tablet Take 250 mg by mouth daily.  Marland Kitchen zinc sulfate 220 (50  Zn) MG capsule Take 220 mg by mouth daily.   No facility-administered encounter medications on file as of 11/17/2018.     PHYSICAL EXAM:   General: NAD, WD/WN sitting in wheelchair; torticollis of neck Cardiovascular: regular rate and rhythm Pulmonary: clear ant fields Abdomen: soft, nontender, + bowel sounds GU: no suprapubic tenderness Extremities: 1+ bilateral lower ext edema, no joint deformities; severe kyphosis Skin: no rashes Neurological: Weakness but otherwise non-focal; fine tremor of hands right greater than left; patient's voice harsh and raspy(most likely due to parkinson's)   G [place], NP

## 2018-12-15 DIAGNOSIS — Z79899 Other long term (current) drug therapy: Secondary | ICD-10-CM | POA: Diagnosis not present

## 2018-12-23 DIAGNOSIS — E039 Hypothyroidism, unspecified: Secondary | ICD-10-CM | POA: Diagnosis not present

## 2018-12-23 DIAGNOSIS — I739 Peripheral vascular disease, unspecified: Secondary | ICD-10-CM | POA: Diagnosis not present

## 2018-12-23 DIAGNOSIS — B351 Tinea unguium: Secondary | ICD-10-CM | POA: Diagnosis not present

## 2018-12-23 DIAGNOSIS — R600 Localized edema: Secondary | ICD-10-CM | POA: Diagnosis not present

## 2018-12-23 DIAGNOSIS — I499 Cardiac arrhythmia, unspecified: Secondary | ICD-10-CM | POA: Diagnosis not present

## 2018-12-23 DIAGNOSIS — L84 Corns and callosities: Secondary | ICD-10-CM | POA: Diagnosis not present

## 2018-12-23 DIAGNOSIS — G20 Parkinson's disease: Secondary | ICD-10-CM | POA: Diagnosis not present

## 2019-01-06 ENCOUNTER — Encounter: Payer: Medicare Other | Admitting: Nurse Practitioner

## 2019-01-13 DIAGNOSIS — N200 Calculus of kidney: Secondary | ICD-10-CM | POA: Diagnosis not present

## 2019-01-13 DIAGNOSIS — N401 Enlarged prostate with lower urinary tract symptoms: Secondary | ICD-10-CM | POA: Diagnosis not present

## 2019-01-13 DIAGNOSIS — E039 Hypothyroidism, unspecified: Secondary | ICD-10-CM | POA: Diagnosis not present

## 2019-01-13 DIAGNOSIS — F0280 Dementia in other diseases classified elsewhere without behavioral disturbance: Secondary | ICD-10-CM | POA: Diagnosis not present

## 2019-01-13 DIAGNOSIS — K5901 Slow transit constipation: Secondary | ICD-10-CM | POA: Diagnosis not present

## 2019-01-13 DIAGNOSIS — Z8701 Personal history of pneumonia (recurrent): Secondary | ICD-10-CM | POA: Diagnosis not present

## 2019-01-13 DIAGNOSIS — D631 Anemia in chronic kidney disease: Secondary | ICD-10-CM | POA: Diagnosis not present

## 2019-01-13 DIAGNOSIS — N182 Chronic kidney disease, stage 2 (mild): Secondary | ICD-10-CM | POA: Diagnosis not present

## 2019-01-13 DIAGNOSIS — Z87440 Personal history of urinary (tract) infections: Secondary | ICD-10-CM | POA: Diagnosis not present

## 2019-01-13 DIAGNOSIS — I739 Peripheral vascular disease, unspecified: Secondary | ICD-10-CM | POA: Diagnosis not present

## 2019-01-13 DIAGNOSIS — G2581 Restless legs syndrome: Secondary | ICD-10-CM | POA: Diagnosis not present

## 2019-01-13 DIAGNOSIS — G20 Parkinson's disease: Secondary | ICD-10-CM | POA: Diagnosis not present

## 2019-01-13 DIAGNOSIS — N3941 Urge incontinence: Secondary | ICD-10-CM | POA: Diagnosis not present

## 2019-02-17 DIAGNOSIS — Z9181 History of falling: Secondary | ICD-10-CM | POA: Diagnosis not present

## 2019-02-17 DIAGNOSIS — G20 Parkinson's disease: Secondary | ICD-10-CM | POA: Diagnosis not present

## 2019-02-20 DIAGNOSIS — I739 Peripheral vascular disease, unspecified: Secondary | ICD-10-CM | POA: Diagnosis not present

## 2019-02-20 DIAGNOSIS — N182 Chronic kidney disease, stage 2 (mild): Secondary | ICD-10-CM | POA: Diagnosis not present

## 2019-02-20 DIAGNOSIS — N3941 Urge incontinence: Secondary | ICD-10-CM | POA: Diagnosis not present

## 2019-02-20 DIAGNOSIS — F0280 Dementia in other diseases classified elsewhere without behavioral disturbance: Secondary | ICD-10-CM | POA: Diagnosis not present

## 2019-02-20 DIAGNOSIS — G2581 Restless legs syndrome: Secondary | ICD-10-CM | POA: Diagnosis not present

## 2019-02-20 DIAGNOSIS — N401 Enlarged prostate with lower urinary tract symptoms: Secondary | ICD-10-CM | POA: Diagnosis not present

## 2019-02-20 DIAGNOSIS — G20 Parkinson's disease: Secondary | ICD-10-CM | POA: Diagnosis not present

## 2019-02-20 DIAGNOSIS — N200 Calculus of kidney: Secondary | ICD-10-CM | POA: Diagnosis not present

## 2019-02-20 DIAGNOSIS — E039 Hypothyroidism, unspecified: Secondary | ICD-10-CM | POA: Diagnosis not present

## 2019-02-20 DIAGNOSIS — D631 Anemia in chronic kidney disease: Secondary | ICD-10-CM | POA: Diagnosis not present

## 2019-02-22 DIAGNOSIS — L84 Corns and callosities: Secondary | ICD-10-CM | POA: Diagnosis not present

## 2019-02-22 DIAGNOSIS — I739 Peripheral vascular disease, unspecified: Secondary | ICD-10-CM | POA: Diagnosis not present

## 2019-02-22 DIAGNOSIS — R600 Localized edema: Secondary | ICD-10-CM | POA: Diagnosis not present

## 2019-02-22 DIAGNOSIS — B351 Tinea unguium: Secondary | ICD-10-CM | POA: Diagnosis not present

## 2019-03-20 DIAGNOSIS — G20 Parkinson's disease: Secondary | ICD-10-CM | POA: Diagnosis not present

## 2019-03-20 DIAGNOSIS — Z9181 History of falling: Secondary | ICD-10-CM | POA: Diagnosis not present

## 2019-04-14 DIAGNOSIS — G20 Parkinson's disease: Secondary | ICD-10-CM | POA: Diagnosis not present

## 2019-04-14 DIAGNOSIS — K5901 Slow transit constipation: Secondary | ICD-10-CM | POA: Diagnosis not present

## 2019-04-14 DIAGNOSIS — F5101 Primary insomnia: Secondary | ICD-10-CM | POA: Diagnosis not present

## 2019-04-14 DIAGNOSIS — R3989 Other symptoms and signs involving the genitourinary system: Secondary | ICD-10-CM | POA: Diagnosis not present

## 2019-04-19 DIAGNOSIS — Z9181 History of falling: Secondary | ICD-10-CM | POA: Diagnosis not present

## 2019-04-19 DIAGNOSIS — G20 Parkinson's disease: Secondary | ICD-10-CM | POA: Diagnosis not present

## 2019-05-20 DIAGNOSIS — G20 Parkinson's disease: Secondary | ICD-10-CM | POA: Diagnosis not present

## 2019-05-20 DIAGNOSIS — Z9181 History of falling: Secondary | ICD-10-CM | POA: Diagnosis not present

## 2019-06-16 ENCOUNTER — Non-Acute Institutional Stay: Payer: Medicare Other | Admitting: Adult Health Nurse Practitioner

## 2019-06-16 ENCOUNTER — Other Ambulatory Visit: Payer: Self-pay

## 2019-06-16 DIAGNOSIS — Z515 Encounter for palliative care: Secondary | ICD-10-CM | POA: Diagnosis not present

## 2019-06-16 NOTE — Progress Notes (Signed)
Craig Giles Consult Note Telephone: [phone]  Fax: [phone]  PATIENT NAME: Craig Giles DOB: 22-Dec-1930 MRN: [id number]  PRIMARY CARE PROVIDER:   [facility]  REFERRING PROVIDER:  [facility], [address] [place],  [place] [address]  RESPONSIBLE PARTY:   Craig Giles (wife) [phone] (h) [phone] (C)   Due to the COVID-19 crisis, this visit was done via telemedicine and it was initiated and consent by this patient and or family.  Visit was via Duo on smart phones assisted by Mudlogger of nursing at facility, Craig Giles.     RECOMMENDATIONS and PLAN:  1.  Dementia. FAST 6d.  Staff and patient's wife reports that he has not been getting up as often as he used to and is requiring more assistance with transfers.  Wife states that he is losing strength in his legs.  Staff reports that they have PT on site called [facility] and they just need a provider order to get him started with PT.  I sent this to the nursing director today.  He is incontinent of B&B.  Wife states that he is gaining weight but there are no reported weight changes.  Staff does report that his appetite has decreased some.    2.  Goals of care. Patient is a DNR. Patient had a UTI about a month ago and this has been treated.  No reported falls or hospitalizations since last visit.  Patient having some decline related to Parkinson's but is fairly stable.  Set order for PT.  Continue supportive care and monitor for any further decline.  I spent 20 minutes providing this consultation,  from 11:00 to 11:20. More than 50% of the time in this consultation was spent coordinating communication.   HISTORY OF PRESENT ILLNESS:  Craig Giles is a 85 y.o. year old male with multiple medical problems including Parkinson's, lewy-body dementia, gait disturbance, frequent falls,  hypothyroidism, BPH, sleep disturbance. Palliative Care was asked to help address goals of care.   CODE STATUS: DNR  PPS: 0% HOSPICE ELIGIBILITY/DIAGNOSIS: TBD  PHYSICAL EXAM:   General: patient sitting in chair in NAD Neurological: Weakness but otherwise nonfocal  PAST MEDICAL HISTORY:  Past Medical History:  Diagnosis Date  . Hypothyroidism   . Memory deficits 10/22/2013  . Parkinson disease ([place])   . Pneumonia    "this is his 2nd time" (12/31/2017)  . 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 Smoking status: Never Smoker  . Smokeless tobacco: Never Used  Substance Use Topics  . Alcohol use: No    ALLERGIES: No Known Allergies   PERTINENT MEDICATIONS:  Outpatient Encounter Medications as of 06/16/2019  Medication Sig  . acetaminophen (TYLENOL) 325 MG tablet Take 650 mg by mouth every 6 (six) hours as needed for mild pain, moderate pain, fever or headache.   Marland Kitchen alum & mag hydroxide-simeth (MINTOX) 631-497-02 MG/5ML suspension Take 30 mLs by mouth every 6 (six) hours as needed for indigestion or heartburn.  . carbidopa-levodopa (SINEMET IR) 25-100 MG tablet Take 1 tablet by mouth 3 (three) times daily.  Regino Schultze Bandages & Supports (MEDICAL COMPRESSION STOCKINGS) MISC 1 Package by Other route daily. Apply in the morning and take off in the afternoon  . finasteride (PROSCAR) 5 MG tablet Take 5 mg by mouth daily.  Marland Kitchen guaifenesin (ROBAFEN) 100 MG/5ML syrup Take 200 mg  by mouth 3 (three) times daily as needed for cough.  . Infant Care Products (DERMACLOUD) CREA Apply 1 application topically 3 (three) times daily. Apply to buttocks and perineal area with brief changes  . levothyroxine (SYNTHROID, LEVOTHROID) 88 MCG tablet Take 88 mcg by mouth daily before breakfast.   . loperamide (IMODIUM A-D) 2 MG tablet Take 2 mg by mouth 4 (four) times daily as needed for diarrhea or loose stools.  . Multiple Vitamin (MULTIVITAMIN WITH MINERALS)  TABS tablet Take 1 tablet by mouth daily.  . polyethylene glycol (MIRALAX / GLYCOLAX) packet Take 17 g by mouth daily.   . ropinirole (REQUIP) 5 MG tablet Take 5 mg by mouth at bedtime.   . saccharomyces boulardii (FLORASTOR) 250 MG capsule Take 250 mg by mouth daily.  . tamsulosin (FLOMAX) 0.4 MG CAPS capsule Take 0.4 mg by mouth daily.  . traZODone (DESYREL) 100 MG tablet Take 1 tablet (100 mg total) by mouth at bedtime.  . vitamin C (ASCORBIC ACID) 250 MG tablet Take 250 mg by mouth daily.  Marland Kitchen. zinc sulfate 220 (50 Zn) MG capsule Take 220 mg by mouth daily.   No facility-administered encounter medications on file as of 06/16/2019.       Craig Giles Craig ClipperK Coleton Woon, NP

## 2019-06-19 DIAGNOSIS — G20 Parkinson's disease: Secondary | ICD-10-CM | POA: Diagnosis not present

## 2019-06-19 DIAGNOSIS — Z9181 History of falling: Secondary | ICD-10-CM | POA: Diagnosis not present

## 2019-07-20 DIAGNOSIS — Z9181 History of falling: Secondary | ICD-10-CM | POA: Diagnosis not present

## 2019-07-20 DIAGNOSIS — G20 Parkinson's disease: Secondary | ICD-10-CM | POA: Diagnosis not present

## 2019-07-21 DIAGNOSIS — R3989 Other symptoms and signs involving the genitourinary system: Secondary | ICD-10-CM | POA: Diagnosis not present

## 2019-07-24 DIAGNOSIS — I739 Peripheral vascular disease, unspecified: Secondary | ICD-10-CM | POA: Diagnosis not present

## 2019-07-24 DIAGNOSIS — B351 Tinea unguium: Secondary | ICD-10-CM | POA: Diagnosis not present

## 2019-08-18 ENCOUNTER — Telehealth: Payer: Self-pay

## 2019-08-18 NOTE — Telephone Encounter (Signed)
Spoke with patient's son, Collier Salina and daughter, Sharyn Lull. Both voiced that they do not feel their father needs to continue with Palliative Care at this time. Sharyn Lull noted that [facility] continues to be PCP. [facility] updated and Palliative Care to sign off.

## 2019-08-18 NOTE — Telephone Encounter (Signed)
Phone call placed to [facility] to verify phone numbers and contact information for patient. As each number listed was answered and person said it was wrong number. Facility staff provided alternate contact: Son,  [phone].  Will reattempt to contact family in response to message received that daughter, Lenna Sciara had called.

## 2019-08-20 DIAGNOSIS — G20 Parkinson's disease: Secondary | ICD-10-CM | POA: Diagnosis not present

## 2019-08-20 DIAGNOSIS — Z9181 History of falling: Secondary | ICD-10-CM | POA: Diagnosis not present

## 2019-09-09 IMAGING — CT CT HEAD W/O CM
3 series · 16 of 47 positions shown, 19 images · non-contrast
Comparison: Head CT dated 03/31/2017.

CLINICAL DATA: Altered level of consciousness. History of dementia.

EXAM:
CT HEAD WITHOUT CONTRAST
TECHNIQUE: Contiguous axial images were obtained from the base of the skull
through the vertex without intravenous contrast.

[Series 3: head 5.0 h30s · axial · 0.44mm/px · z∈[-216,-60]mm · 10 of 37 slices shown, 13 images]
[im 3/37  brain]
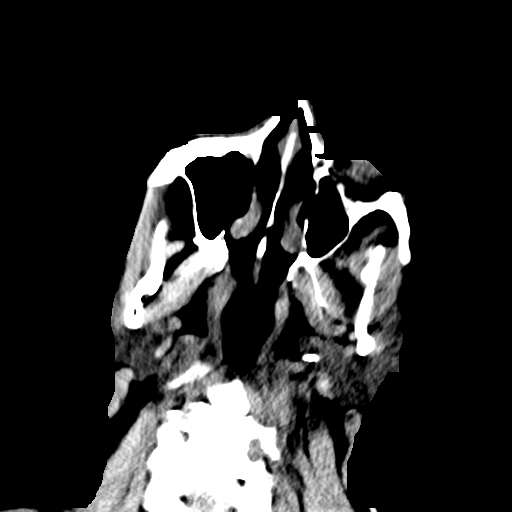
[im 3/37  bone]
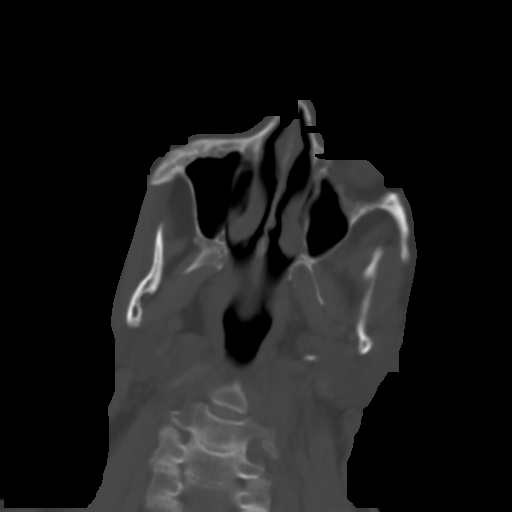
[im 7/37  brain]
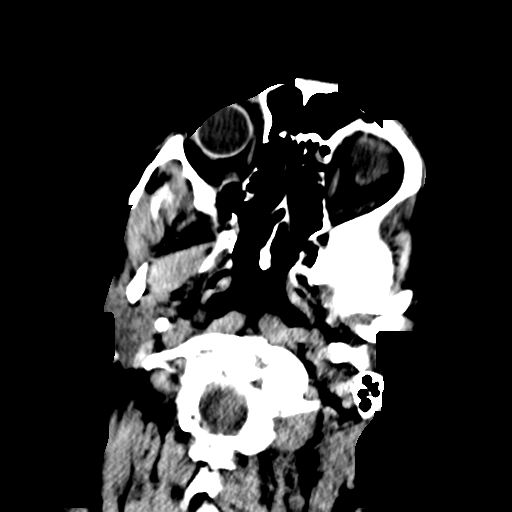
[im 10/37  brain]
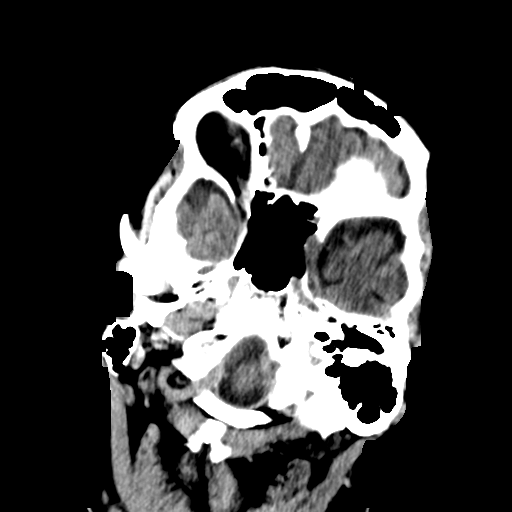
[im 13/37  brain]
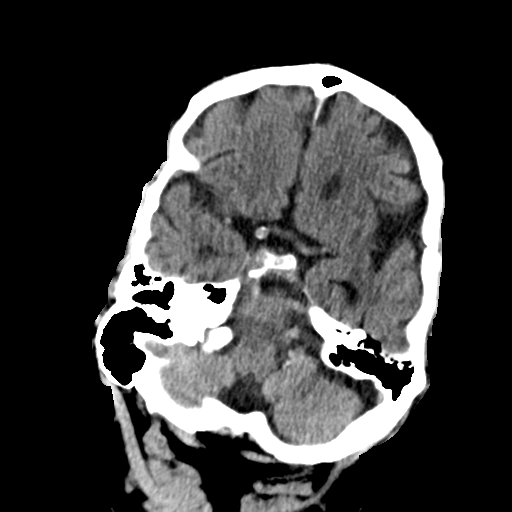
[im 17/37  brain]
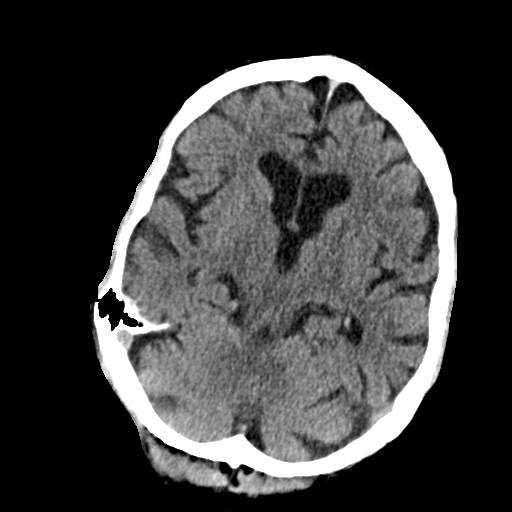
[im 17/37  bone]
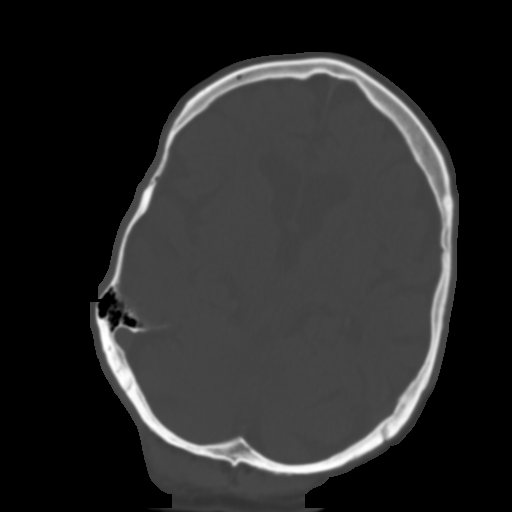
[im 20/37  brain]
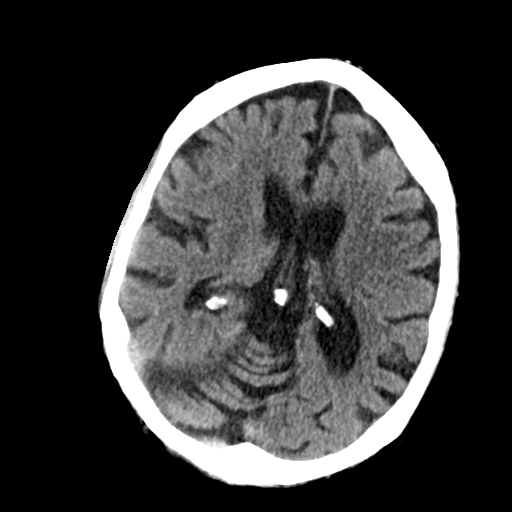
[im 24/37  brain]
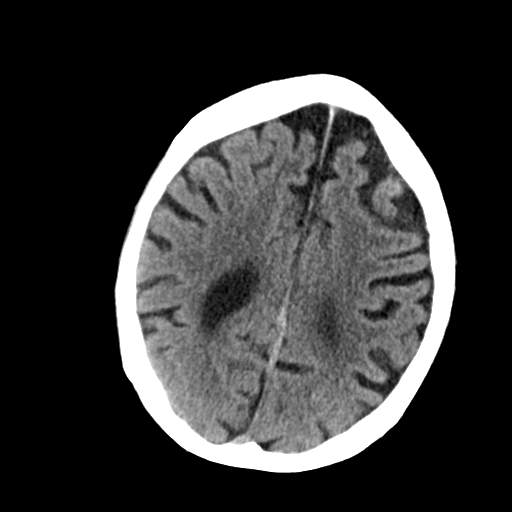
[im 28/37  brain]
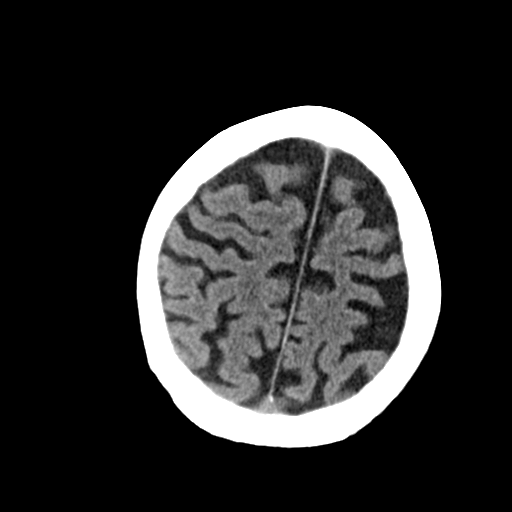
[im 30/37  brain]
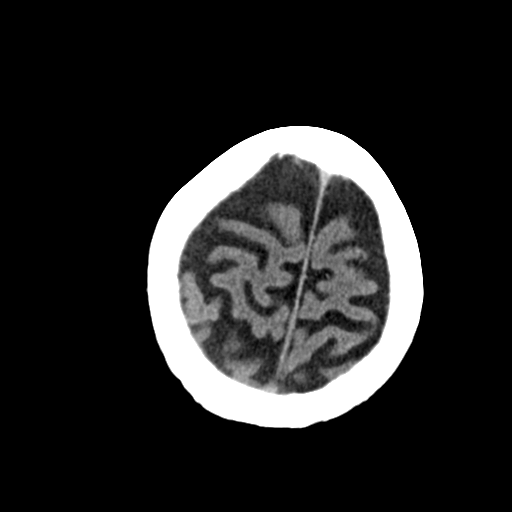
[im 30/37  bone]
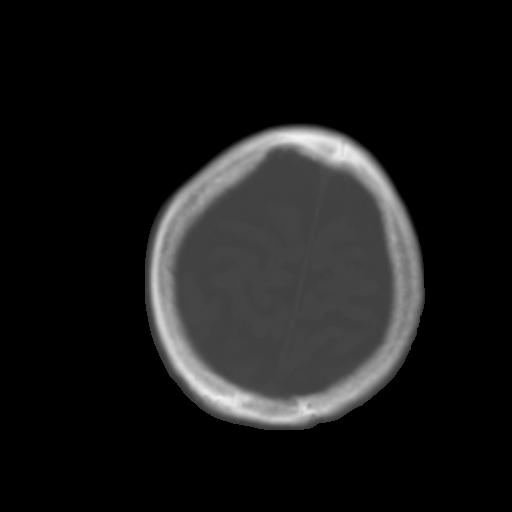
[im 34/37  brain]
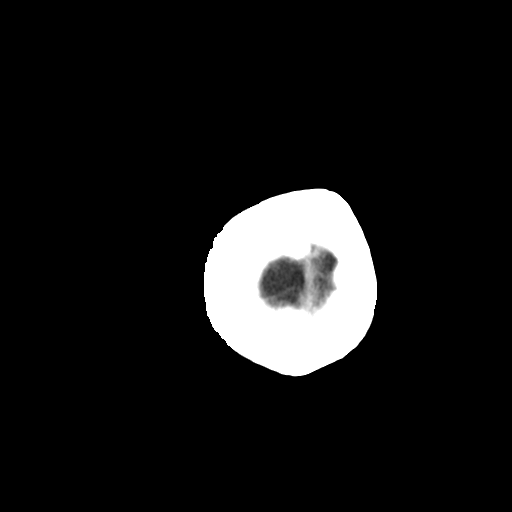

[Series 5: head 3.0 mpr cor · coronal · 0.37mm/px · 3 of 69 slices shown]
[im 23/69  brain]
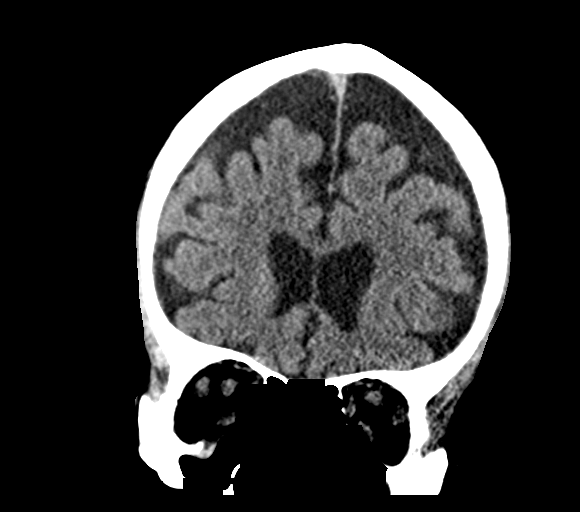
[im 31/69  brain]
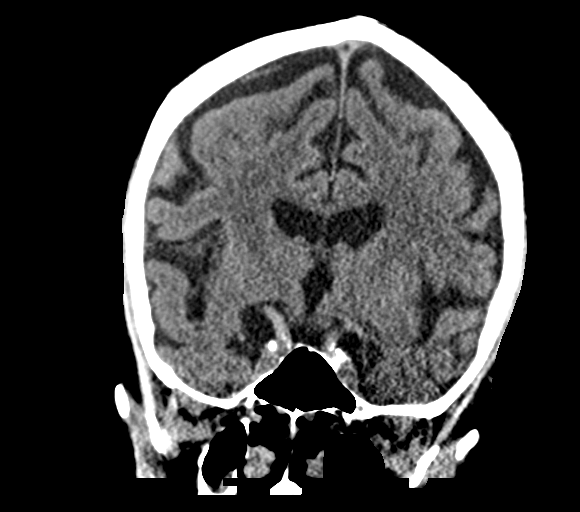
[im 38/69  brain]
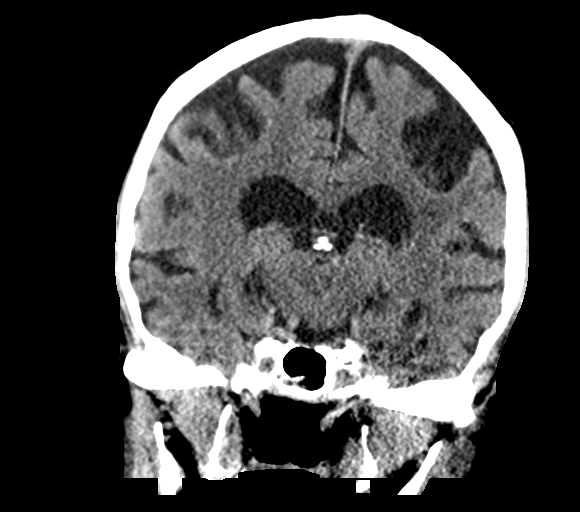

[Series 6: head 3.0 mpr sag · sagittal · 0.37mm/px · 3 of 66 slices shown]
[im 22/66  brain]
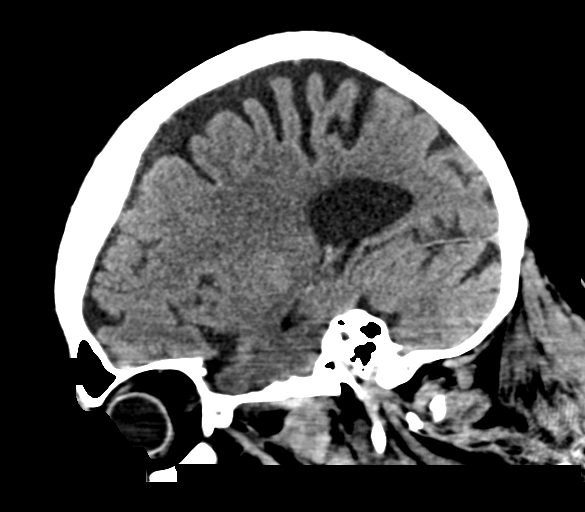
[im 33/66  brain]
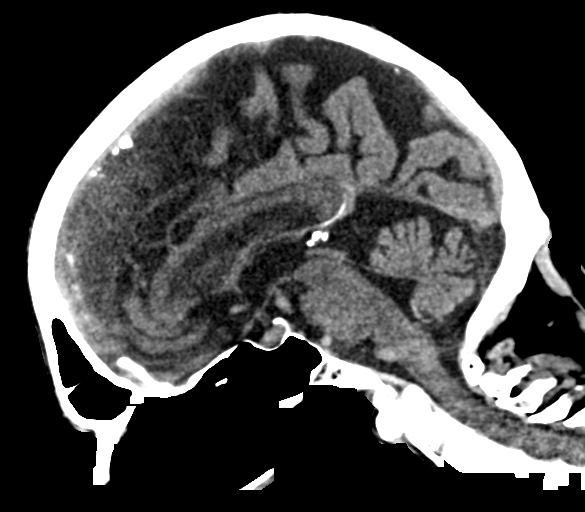
[im 44/66  brain]
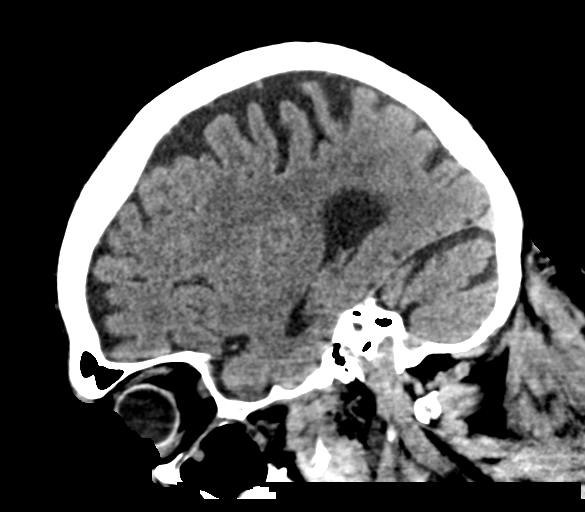

[16 of 47 positions shown; findings below may reference images not displayed]

FINDINGS: Brain: Generalized age related parenchymal atrophy with commensurate
dilatation of the ventricles and sulci. No mass, hemorrhage, edema
or other evidence of acute parenchymal abnormality. No extra-axial
hemorrhage.

Vascular: There are chronic calcified atherosclerotic changes of the
large vessels at the skull base. No unexpected hyperdense vessel.

Skull: Normal. Negative for fracture or focal lesion.

Sinuses/Orbits: No acute finding.

Other: None.
IMPRESSION: 1. No acute findings.  No intracranial mass, hemorrhage or edema.
2. Atrophy.

## 2019-09-15 DIAGNOSIS — K5901 Slow transit constipation: Secondary | ICD-10-CM | POA: Diagnosis not present

## 2019-09-15 DIAGNOSIS — F5101 Primary insomnia: Secondary | ICD-10-CM | POA: Diagnosis not present

## 2019-09-15 DIAGNOSIS — N390 Urinary tract infection, site not specified: Secondary | ICD-10-CM | POA: Diagnosis not present

## 2019-09-15 DIAGNOSIS — G20 Parkinson's disease: Secondary | ICD-10-CM | POA: Diagnosis not present

## 2019-09-19 DIAGNOSIS — G20 Parkinson's disease: Secondary | ICD-10-CM | POA: Diagnosis not present

## 2019-09-19 DIAGNOSIS — Z9181 History of falling: Secondary | ICD-10-CM | POA: Diagnosis not present

## 2019-09-27 DIAGNOSIS — R600 Localized edema: Secondary | ICD-10-CM | POA: Diagnosis not present

## 2019-09-27 DIAGNOSIS — B351 Tinea unguium: Secondary | ICD-10-CM | POA: Diagnosis not present

## 2019-09-27 DIAGNOSIS — I739 Peripheral vascular disease, unspecified: Secondary | ICD-10-CM | POA: Diagnosis not present

## 2019-10-06 DIAGNOSIS — E039 Hypothyroidism, unspecified: Secondary | ICD-10-CM | POA: Diagnosis not present

## 2019-10-06 DIAGNOSIS — Z0000 Encounter for general adult medical examination without abnormal findings: Secondary | ICD-10-CM | POA: Diagnosis not present

## 2019-10-06 DIAGNOSIS — I499 Cardiac arrhythmia, unspecified: Secondary | ICD-10-CM | POA: Diagnosis not present

## 2019-10-06 DIAGNOSIS — K5901 Slow transit constipation: Secondary | ICD-10-CM | POA: Diagnosis not present

## 2019-10-20 DIAGNOSIS — R0602 Shortness of breath: Secondary | ICD-10-CM | POA: Diagnosis not present

## 2019-10-20 DIAGNOSIS — Z79899 Other long term (current) drug therapy: Secondary | ICD-10-CM | POA: Diagnosis not present

## 2019-10-20 DIAGNOSIS — Z9181 History of falling: Secondary | ICD-10-CM | POA: Diagnosis not present

## 2019-10-20 DIAGNOSIS — F5101 Primary insomnia: Secondary | ICD-10-CM | POA: Diagnosis not present

## 2019-10-20 DIAGNOSIS — G20 Parkinson's disease: Secondary | ICD-10-CM | POA: Diagnosis not present

## 2019-11-19 DIAGNOSIS — Z9181 History of falling: Secondary | ICD-10-CM | POA: Diagnosis not present

## 2019-11-19 DIAGNOSIS — G20 Parkinson's disease: Secondary | ICD-10-CM | POA: Diagnosis not present

## 2020-09-11 ENCOUNTER — Emergency Department (HOSPITAL_COMMUNITY)
Admission: EM | Admit: 2020-09-11 | Discharge: 2020-09-11 | Disposition: A | Discharge status: Home / Self Care | Payer: Medicare Other | Attending: Emergency Medicine | Admitting: Emergency Medicine

## 2020-09-11 ENCOUNTER — Emergency Department (HOSPITAL_COMMUNITY): Payer: Medicare Other

## 2020-09-11 DIAGNOSIS — E039 Hypothyroidism, unspecified: Secondary | ICD-10-CM | POA: Insufficient documentation

## 2020-09-11 DIAGNOSIS — F0280 Dementia in other diseases classified elsewhere without behavioral disturbance: Secondary | ICD-10-CM | POA: Insufficient documentation

## 2020-09-11 DIAGNOSIS — Z79899 Other long term (current) drug therapy: Secondary | ICD-10-CM | POA: Diagnosis not present

## 2020-09-11 DIAGNOSIS — G20 Parkinson's disease: Secondary | ICD-10-CM | POA: Diagnosis not present

## 2020-09-11 DIAGNOSIS — R4182 Altered mental status, unspecified: Secondary | ICD-10-CM

## 2020-09-11 LAB — URINALYSIS, ROUTINE W REFLEX MICROSCOPIC
Bilirubin Urine: NEGATIVE
Glucose, UA: NEGATIVE mg/dL
Ketones, ur: NEGATIVE mg/dL
Nitrite: POSITIVE — AB
Protein, ur: NEGATIVE mg/dL
Specific Gravity, Urine: 1.008 (ref 1.005–1.030)
WBC, UA: >50 WBC/hpf — ABNORMAL HIGH (ref 0–5)
pH: 7 (ref 5.0–8.0)

## 2020-09-11 LAB — CBC WITH DIFFERENTIAL/PLATELET
Abs Immature Granulocytes: 0.02 10*3/uL (ref 0.00–0.07)
Basophils Absolute: 0.1 10*3/uL (ref 0.0–0.1)
Basophils Relative: 1 %
Eosinophils Absolute: 0.2 10*3/uL (ref 0.0–0.5)
Eosinophils Relative: 3 %
HCT: 39.5 % (ref 39.0–52.0)
Hemoglobin: 12.7 g/dL — ABNORMAL LOW (ref 13.0–17.0)
Immature Granulocytes: 0 %
Lymphocytes Relative: 27 %
Lymphs Abs: 1.6 10*3/uL (ref 0.7–4.0)
MCH: 30.2 pg (ref 26.0–34.0)
MCHC: 32.2 g/dL (ref 30.0–36.0)
MCV: 94 fL (ref 80.0–100.0)
Monocytes Absolute: 0.5 10*3/uL (ref 0.1–1.0)
Monocytes Relative: 8 %
Neutro Abs: 3.6 10*3/uL (ref 1.7–7.7)
Neutrophils Relative %: 61 %
Platelets: 197 10*3/uL (ref 150–400)
RBC: 4.2 MIL/uL — ABNORMAL LOW (ref 4.22–5.81)
RDW: 14.3 % (ref 11.5–15.5)
WBC: 5.8 10*3/uL (ref 4.0–10.5)
nRBC: 0 % (ref 0.0–0.2)

## 2020-09-11 LAB — COMPREHENSIVE METABOLIC PANEL
ALT: 9 U/L (ref 0–44)
AST: 18 U/L (ref 15–41)
Albumin: 3.8 g/dL (ref 3.5–5.0)
Alkaline Phosphatase: 81 U/L (ref 38–126)
Anion gap: 9 (ref 5–15)
BUN: 11 mg/dL (ref 8–23)
CO2: 27 mmol/L (ref 22–32)
Calcium: 9.1 mg/dL (ref 8.9–10.3)
Chloride: 101 mmol/L (ref 98–111)
Creatinine, Ser: 1.01 mg/dL (ref 0.61–1.24)
GFR, Estimated: >60 mL/min (ref >60)
Glucose, Bld: 103 mg/dL — ABNORMAL HIGH (ref 70–99)
Potassium: 4.4 mmol/L (ref 3.5–5.1)
Sodium: 137 mmol/L (ref 135–145)
Total Bilirubin: 1.1 mg/dL (ref 0.3–1.2)
Total Protein: 6.8 g/dL (ref 6.5–8.1)

## 2020-09-11 LAB — AMMONIA: Ammonia: 14 umol/L (ref 9–35)

## 2020-09-11 LAB — LACTIC ACID, PLASMA: Lactic Acid, Venous: 1.3 mmol/L (ref 0.5–1.9)

## 2020-09-11 MED ORDER — LACTATED RINGERS IV BOLUS
1000.0000 mL | Freq: Once | INTRAVENOUS | Status: AC
  Administered 2020-09-11: 1000 mL via INTRAVENOUS

## 2020-09-11 NOTE — ED Notes (Signed)
Pt provided with sandwich.

## 2020-09-11 NOTE — ED Notes (Signed)
Called [facility] 

## 2020-09-11 NOTE — ED Provider Notes (Signed)
  Physical Exam  BP 111/66   Pulse (!) 56   Temp 98.6 F (37 C) (Rectal)   Resp 16   SpO2 94%   Physical Exam  ED Course/Procedures     Procedures  MDM  Received patient signout.  Had had mental status change.  Now improved.  Work-up overall reassuring.  X-ray showed possibl pneumonia but thought more likely atelectasis with no white count or fever.  Urine also has white cells but only rare bacteria.  Culture sent to will not empirically treat.  Did have infiltrate of saline in the right arm.  Just swollen but can be followed as an outpatient.  Discharge.       Benjiman Core, MD 09/11/20 [phone]

## 2020-09-11 NOTE — ED Provider Notes (Signed)
[facility]-EMERGENCY DEPT Provider Note   CSN: [id number] Arrival date & time: 09/11/20  1252     History Chief Complaint  Patient presents with  . Altered Mental Status    Craig Giles is a 86 y.o. male.  Patient is an 86 year old male with a history of dementia, Parkinson's disease, hypothyroidism who is presenting today from his skilled facility due to altered mental status.  Facility reports that the last time he seemed his normal self was around 10 AM and when they went back to check on him patient was unresponsive.  Report is that patient at baseline is in a wheelchair and usually slouches to one side is able to look up and speak.  Since they saw him since 10 AM patient withdraws to pain but is otherwise not acting himself.  Unable to state whether he has had fever, cough, congestion, shortness of breath, vomiting or diarrhea.  Patient is not able to give any history on exam  The history is provided by the EMS personnel, the nursing home and medical records. The history is limited by the absence of a caregiver and the condition of the patient.  Altered Mental Status      Past Medical History:  Diagnosis Date  . Hypothyroidism   . Memory deficits 10/22/2013  . Parkinson disease (HCC)   . Pneumonia    "this is his 2nd time" (12/31/2017)  . 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 CAP (community acquired pneumonia) 12/31/2017  . Hypothyroidism 12/31/2017  . Dementia due to Parkinson's disease without behavioral disturbance (HCC)   . HCAP (healthcare-associated pneumonia)   . Generalized weakness 05/28/2017  . Indwelling Foley catheter present   . Rectal bleeding   . GI bleed 05/18/2017  . Pressure injury of skin 04/01/2017  . PNA (pneumonia) 03/31/2017  . Chronic insomnia 08/12/2016  . 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 Memory deficits 10/22/2013  . Abnormality of gait  11/10/2012  . Paralysis agitans (HCC) 11/10/2012    Past Surgical History:  Procedure Laterality Date  . CATARACT EXTRACTION W/ INTRAOCULAR LENS  IMPLANT, BILATERAL Bilateral   . TONSILLECTOMY         Family History  Problem Relation Age of Onset  . Diabetes Brother     Social History   Tobacco Use  . Smoking status: Never Smoker  . Smokeless tobacco: Never Used  Vaping Use  . Vaping Use: Never used  Substance Use Topics  . Alcohol use: No  . Drug use: No    Home Medications Prior to Admission medications   Medication Sig Start Date End Date Taking? Authorizing Provider  acetaminophen (TYLENOL) 325 MG tablet Take 650 mg by mouth every 6 (six) hours as needed for mild pain, moderate pain, fever or headache.     [provider]  alum & mag hydroxide-simeth (MINTOX) 200-200-20 MG/5ML suspension Take 30 mLs by mouth every 6 (six) hours as needed for indigestion or heartburn.    [provider]  carbidopa-levodopa (SINEMET IR) 25-100 MG tablet Take 1 tablet by mouth 3 (three) times daily. 12/30/16   York Spaniel, MD  Elastic Bandages & Supports (MEDICAL COMPRESSION STOCKINGS) MISC 1 Package by Other route daily. Apply in the morning and take off in the afternoon    [provider]  finasteride (PROSCAR) 5 MG tablet Take 5 mg by mouth daily. 12/17/17   [provider]  guaifenesin (  ROBAFEN) 100 MG/5ML syrup Take 200 mg by mouth 3 (three) times daily as needed for cough.    [provider]  Infant Care Products (DERMACLOUD) CREA Apply 1 application topically 3 (three) times daily. Apply to buttocks and perineal area with brief changes    [provider]  levothyroxine (SYNTHROID, LEVOTHROID) 88 MCG tablet Take 88 mcg by mouth daily before breakfast.     [provider]  loperamide (IMODIUM A-D) 2 MG tablet Take 2 mg by mouth 4 (four) times daily as needed for diarrhea or loose stools.    [provider]    Multiple Vitamin (MULTIVITAMIN WITH MINERALS) TABS tablet Take 1 tablet by mouth daily.    [provider]  polyethylene glycol (MIRALAX / GLYCOLAX) packet Take 17 g by mouth daily.     [provider]  ropinirole (REQUIP) 5 MG tablet Take 5 mg by mouth at bedtime.     [provider]  saccharomyces boulardii (FLORASTOR) 250 MG capsule Take 250 mg by mouth daily.    [provider]  tamsulosin (FLOMAX) 0.4 MG CAPS capsule Take 0.4 mg by mouth daily. 12/24/17   [provider]  traZODone (DESYREL) 100 MG tablet Take 1 tablet (100 mg total) by mouth at bedtime. 12/30/16   York Spaniel, MD  vitamin C (ASCORBIC ACID) 250 MG tablet Take 250 mg by mouth daily.    [provider]  zinc sulfate 220 (50 Zn) MG capsule Take 220 mg by mouth daily.    [provider]    Allergies    Patient has no known allergies.  Review of Systems   Review of Systems  Unable to perform ROS: Mental status change    Physical Exam Updated Vital Signs BP 133/73 (BP Location: Left Arm)   Pulse 63   Temp 97.8 F (36.6 C) (Oral)   Resp (!) 21   SpO2 99%   Physical Exam Vitals and nursing note reviewed.  Constitutional:      General: He is not in acute distress.    Appearance: He is well-developed. He is obese.     Comments: Eyes are closed and resists opening and not following commands  HENT:     Head: Normocephalic and atraumatic.     Right Ear: Tympanic membrane normal.     Left Ear: Tympanic membrane normal.  Eyes:     Conjunctiva/sclera: Conjunctivae normal.     Pupils: Pupils are equal, round, and reactive to light.  Cardiovascular:     Rate and Rhythm: Normal rate and regular rhythm.     Pulses: Normal pulses.     Heart sounds: No murmur heard.   Pulmonary:     Effort: Pulmonary effort is normal. No respiratory distress.     Breath sounds: Normal breath sounds. No wheezing or rales.  Abdominal:     General: There is no  distension.     Palpations: Abdomen is soft.     Tenderness: There is no abdominal tenderness. There is no guarding or rebound.  Musculoskeletal:        General: No tenderness. Normal range of motion.     Cervical back: Normal range of motion and neck supple.  Skin:    General: Skin is warm and dry.     Findings: No erythema or rash.  Neurological:     Comments: Mild resting tremor noted in the right hand.  Stiffness and resistance when trying to move all ext  Psychiatric:  Comments: Not responding and lethargic     ED Results / Procedures / Treatments   Labs (all labs ordered are listed, but only abnormal results are displayed) Labs Reviewed  URINE CULTURE  CBC WITH DIFFERENTIAL/PLATELET  COMPREHENSIVE METABOLIC PANEL  URINALYSIS, ROUTINE W REFLEX MICROSCOPIC  LACTIC ACID, PLASMA  AMMONIA  BLOOD GAS, VENOUS    EKG None  Radiology No results found.  Procedures Ultrasound ED Peripheral IV (Provider)  Date/Time: 09/11/2020 3:23 PM Performed by: Gwyneth Sprout, MD Authorized by: Gwyneth Sprout, MD   Procedure details:    Indications: multiple failed IV attempts     Skin Prep: chlorhexidine gluconate     Location:  Right AC   Angiocath:  20 G   Bedside Ultrasound Guided: Yes     Images: not archived     Patient tolerated procedure without complications: Yes     Dressing applied: Yes     (including critical care time)  Medications Ordered in ED Medications  lactated ringers bolus 1,000 mL (has no administration in time range)    ED Course  I have reviewed the triage vital signs and the nursing notes.  Pertinent labs & imaging results that were available during my care of the patient were reviewed by me and considered in my medical decision making (see chart for details).    MDM Rules/Calculators/A&P                          Elderly male with multiple medical problems presenting today with abnormal mental status.  Facility reports since 10 AM was  the last time he was seen normal.  Patient has no focal deficits but is resistant to any time of movement of his body or when trying to open his eyes he resists.  Concern for possible underlying infection versus stroke versus dehydration versus medication reaction versus hyperammonemia versus hypercarbia.  Will check a rectal temp to ensure no signs of sepsis but at this time vital signs are reassuring.  Labs and imaging pending.  3:22 PM Unable to obtain IV access.  When I went to get an ultrasound-guided IV patient is now awake and alert and seems to be more at baseline.  He is reporting that he is hungry.  He is no longer unresponsive.  Unclear etiology of patient's prior episode but appears to be baseline now.  X-ray shows opacities in the right midlung and lung base which may represent atelectasis aspiration or pneumonia.  Patient is satting 99% on room air with no tachypnea.  Most likely atelectasis versus aspiration as he does have a history of Parkinson's disease and would be high risk.  Final Clinical Impression(s) / ED Diagnoses Final diagnoses:  None    Rx / DC Orders ED Discharge Orders    None       Gwyneth Sprout, MD 09/11/20 1524

## 2020-09-11 NOTE — ED Notes (Signed)
Pt given [place] sandwich. No other concerns expressed at this time.

## 2020-09-11 NOTE — ED Triage Notes (Signed)
[facility] transported pt from [facility] to [facility] ED and reports the following:   Staff state today at 10 AM is last time the pt was seen at baseline. At baseline, pt sits in a wheel chair, his head does slum forward, and he does look up. Upon EMS arrival, pt was not talking, responding, and drooling. Withdrawals from pain. Hx parkinson's.

## 2020-09-11 NOTE — ED Notes (Signed)
Patient transported to CT 

## 2020-09-11 NOTE — ED Notes (Signed)
RN attempted in and out cath twice with no success. Writer placed condom cath. MD notified by RN.

## 2020-09-11 NOTE — ED Notes (Signed)
Pt returned from imaging. Pt given sandwich and another soup. V/S cycling and pt content at this time. Will continue to monitor.

## 2020-09-11 NOTE — Discharge Instructions (Signed)
Your urine has been sent for culture.  You are not being treated unless it grows something at that point you may need treatment.  Follow-up with your doctor to further evaluate this although the work-up here overall is been reassuring.

## 2020-09-12 LAB — URINE CULTURE: Culture: >=100000 — AB

## 2020-09-13 LAB — URINE CULTURE

## 2020-09-14 ENCOUNTER — Telehealth: Payer: Self-pay

## 2020-09-14 NOTE — Telephone Encounter (Signed)
No treatment for [facility] ED 09/11/20 per Clinton Quant PA  Pt to return to ED or PCP if recurrent problems

## 2020-12-24 ENCOUNTER — Emergency Department (HOSPITAL_BASED_OUTPATIENT_CLINIC_OR_DEPARTMENT_OTHER): Payer: Medicare HMO

## 2020-12-24 ENCOUNTER — Emergency Department (HOSPITAL_BASED_OUTPATIENT_CLINIC_OR_DEPARTMENT_OTHER)
Admission: EM | Admit: 2020-12-24 | Discharge: 2020-12-24 | Disposition: A | Discharge status: Home / Self Care | Payer: Medicare HMO | Attending: Emergency Medicine | Admitting: Emergency Medicine

## 2020-12-24 ENCOUNTER — Encounter (HOSPITAL_BASED_OUTPATIENT_CLINIC_OR_DEPARTMENT_OTHER): Payer: Self-pay | Admitting: Emergency Medicine

## 2020-12-24 ENCOUNTER — Other Ambulatory Visit: Payer: Self-pay

## 2020-12-24 DIAGNOSIS — E039 Hypothyroidism, unspecified: Secondary | ICD-10-CM | POA: Diagnosis not present

## 2020-12-24 DIAGNOSIS — S8991XA Unspecified injury of right lower leg, initial encounter: Secondary | ICD-10-CM | POA: Diagnosis present

## 2020-12-24 DIAGNOSIS — Z79899 Other long term (current) drug therapy: Secondary | ICD-10-CM | POA: Diagnosis not present

## 2020-12-24 DIAGNOSIS — W19XXXA Unspecified fall, initial encounter: Secondary | ICD-10-CM

## 2020-12-24 DIAGNOSIS — G20 Parkinson's disease: Secondary | ICD-10-CM | POA: Diagnosis not present

## 2020-12-24 DIAGNOSIS — S80211A Abrasion, right knee, initial encounter: Secondary | ICD-10-CM | POA: Diagnosis not present

## 2020-12-24 DIAGNOSIS — W1830XA Fall on same level, unspecified, initial encounter: Secondary | ICD-10-CM | POA: Insufficient documentation

## 2020-12-24 DIAGNOSIS — Z23 Encounter for immunization: Secondary | ICD-10-CM | POA: Insufficient documentation

## 2020-12-24 DIAGNOSIS — M79602 Pain in left arm: Secondary | ICD-10-CM | POA: Diagnosis not present

## 2020-12-24 MED ORDER — TETANUS-DIPHTH-ACELL PERTUSSIS 5-2.5-18.5 LF-MCG/0.5 IM SUSY
0.5000 mL | PREFILLED_SYRINGE | Freq: Once | INTRAMUSCULAR | Status: AC
  Administered 2020-12-24: 0.5 mL via INTRAMUSCULAR
  Filled 2020-12-24: qty 0.5

## 2020-12-24 NOTE — ED Notes (Signed)
Updated son peter via phone at this time. Awaiting ptar

## 2020-12-24 NOTE — ED Triage Notes (Signed)
Unwitnessed fall at [facility]. Pt found on floor leaning up against a dresser. Pressure marks to left head and left upper arm. Abrasion to right knee. Pt c/o left arm pain but falls asleep quickly. Hx dementia, EMS reports patient is at baseline at this time.

## 2020-12-24 NOTE — ED Provider Notes (Signed)
[facility] EMERGENCY DEPARTMENT Provider Note   CSN: [id number] Arrival date & time: 12/24/20  0331     History Chief Complaint  Patient presents with  . Fall    Craig Giles is a 87 y.o. male.  The history is provided by the EMS personnel. The history is limited by the condition of the patient.  Fall This is a new problem. The current episode started less than 1 hour ago. The problem occurs constantly. The problem has not changed since onset.Pertinent negatives include no chest pain. Nothing aggravates the symptoms. Nothing relieves the symptoms. He has tried nothing for the symptoms. The treatment provided no relief.       Past Medical History:  Diagnosis Date  . Hypothyroidism   . Memory deficits 10/22/2013  . Parkinson disease (HCC)   . Pneumonia    "this is his 2nd time" (12/31/2017)  . 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 CAP (community acquired pneumonia) 12/31/2017  . Hypothyroidism 12/31/2017  . Dementia due to Parkinson's disease without behavioral disturbance (HCC)   . HCAP (healthcare-associated pneumonia)   . Generalized weakness 05/28/2017  . Indwelling Foley catheter present   . Rectal bleeding   . GI bleed 05/18/2017  . Pressure injury of skin 04/01/2017  . PNA (pneumonia) 03/31/2017  . Chronic insomnia 08/12/2016  . 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 Memory deficits 10/22/2013  . Abnormality of gait 11/10/2012  . Paralysis agitans (HCC) 11/10/2012    Past Surgical History:  Procedure Laterality Date  . CATARACT EXTRACTION W/ INTRAOCULAR LENS  IMPLANT, BILATERAL Bilateral   . TONSILLECTOMY         Family History  Problem Relation Age of Onset  . Diabetes Brother     Social History   Tobacco Use  . Smoking status: Never Smoker  . Smokeless tobacco: Never Used  Vaping Use  . Vaping Use: Never used  Substance Use Topics  . Alcohol use: No  . Drug  use: No    Home Medications Prior to Admission medications   Medication Sig Start Date End Date Taking? Authorizing Provider  acetaminophen (TYLENOL) 325 MG tablet Take 650 mg by mouth every 6 (six) hours as needed for mild pain, moderate pain, fever or headache.    Yes [provider]  carbidopa-levodopa (SINEMET IR) 25-100 MG tablet Take 1 tablet by mouth 3 (three) times daily. 12/30/16  Yes York SpanielWillis, Charles K, MD  Elastic Bandages & Supports (MEDICAL COMPRESSION STOCKINGS) MISC 1 Package by Other route daily. Apply in the morning and take off in the afternoon   Yes [provider]  finasteride (PROSCAR) 5 MG tablet Take 5 mg by mouth daily. 12/17/17  Yes [provider]  levothyroxine (SYNTHROID, LEVOTHROID) 88 MCG tablet Take 88 mcg by mouth daily before breakfast.    Yes [provider]  loperamide (IMODIUM A-D) 2 MG tablet Take 2 mg by mouth 4 (four) times daily as needed for diarrhea or loose stools.   Yes [provider]  loratadine (CLARITIN) 10 MG tablet Take 10 mg by mouth daily as needed for allergies.   Yes [provider]  Melatonin 10 MG CAPS Take 10 mg by mouth at bedtime.   Yes [provider]  methocarbamol (ROBAXIN) 500 MG tablet Take 500 mg by mouth at bedtime.   Yes [provider]  polyethylene glycol (MIRALAX / GLYCOLAX) packet Take 17 g by mouth  daily.   Yes [provider]  ropinirole (REQUIP) 5 MG tablet Take 5 mg by mouth at bedtime.    Yes [provider]  saccharomyces boulardii (FLORASTOR) 250 MG capsule Take 250 mg by mouth daily.   Yes [provider]  tamsulosin (FLOMAX) 0.4 MG CAPS capsule Take 0.4 mg by mouth daily. 12/24/17  Yes [provider]  traZODone (DESYREL) 100 MG tablet Take 1 tablet (100 mg total) by mouth at bedtime. 12/30/16  Yes York Spaniel, MD  alum & mag hydroxide-simeth (MAALOX/MYLANTA) 200-200-20 MG/5ML suspension Take 30 mLs by mouth  every 6 (six) hours as needed for indigestion or heartburn.    [provider]  guaifenesin (ROBITUSSIN) 100 MG/5ML syrup Take 200 mg by mouth 3 (three) times daily as needed for cough.    [provider]  Infant Care Products (DERMACLOUD) CREA Apply 1 application topically 3 (three) times daily. Apply to buttocks and perineal area with brief changes    [provider]  Multiple Vitamin (MULTIVITAMIN WITH MINERALS) TABS tablet Take 1 tablet by mouth daily.    [provider]  vitamin C (ASCORBIC ACID) 250 MG tablet Take 250 mg by mouth daily.    [provider]  zinc sulfate 220 (50 Zn) MG capsule Take 220 mg by mouth daily.    [provider]    Allergies    Patient has no known allergies.  Review of Systems   Review of Systems  Unable to perform ROS: Dementia  Constitutional: Negative for fever.  Cardiovascular: Negative for chest pain.  Skin: Negative for wound.    Physical Exam Updated Vital Signs BP 128/86 (BP Location: Right Arm)   Pulse 65   Temp 98.7 F (37.1 C) (Oral)   Resp 20   SpO2 97%   Physical Exam Vitals and nursing note reviewed.  Constitutional:      General: He is not in acute distress. HENT:     Head: Normocephalic and atraumatic.     Right Ear: Tympanic membrane normal.     Left Ear: Tympanic membrane normal.     Nose: Nose normal.  Eyes:     Pupils: Pupils are equal, round, and reactive to light.  Cardiovascular:     Rate and Rhythm: Normal rate and regular rhythm.     Pulses: Normal pulses.     Heart sounds: Normal heart sounds.  Pulmonary:     Effort: Pulmonary effort is normal.     Breath sounds: Normal breath sounds.  Abdominal:     General: Abdomen is flat. Bowel sounds are normal.     Palpations: Abdomen is soft.     Tenderness: There is no abdominal tenderness. There is no guarding.  Musculoskeletal:        General: Normal range of motion.     Right upper arm: Normal.     Left upper  arm: Normal.     Right wrist: Normal. No snuff box tenderness.     Left wrist: Normal. No snuff box tenderness.     Right hand: Normal.     Left hand: Normal.     Cervical back: Normal range of motion and neck supple.     Right hip: Normal.     Left hip: Normal.     Right knee: Normal.     Left knee: Normal.     Right ankle: Normal.     Right Achilles Tendon: Normal.     Left ankle: Normal.  Left Achilles Tendon: Normal.     Right foot: Normal.     Left foot: Normal.  Skin:    General: Skin is warm and dry.     Capillary Refill: Capillary refill takes less than 2 seconds.  Neurological:     Deep Tendon Reflexes: Reflexes normal.     ED Results / Procedures / Treatments   Labs (all labs ordered are listed, but only abnormal results are displayed) Labs Reviewed - No data to display  EKG None  Radiology CT Head Wo Contrast  Result Date: 12/24/2020 CLINICAL DATA:  Unwitnessed fall, facial trauma EXAM: CT HEAD WITHOUT CONTRAST CT CERVICAL SPINE WITHOUT CONTRAST TECHNIQUE: Multidetector CT imaging of the head and cervical spine was performed following the standard protocol without intravenous contrast. Multiplanar CT image reconstructions of the cervical spine were also generated. COMPARISON:  CT head 09/11/2020 FINDINGS: The examination is moderately limited by nonstandard patient positioning and resultant image degradation. CT HEAD FINDINGS Brain: Normal anatomic configuration. Parenchymal volume loss is commensurate with the patient's age. Gray-white matter differentiation is poorly delineated as result of technical limitation. No abnormal intra or extra-axial mass lesion or fluid collection. No abnormal mass effect or midline shift. No definite evidence of acute intracranial hemorrhage or infarct. Ventricular size is normal. Cerebellum unremarkable. Vascular: No asymmetric hyperdense vasculature at the skull base. Skull: Intact Sinuses/Orbits: There is chronic opacification of the  left frontal sinus and left maxillary sinus. Remaining paranasal sinuses are clear. Orbits are unremarkable. Other: Mastoid air cells and middle ear cavities are clear. CT CERVICAL SPINE FINDINGS Alignment: Reversal of the normal cervical lordosis is likely related to patient positioning. No listhesis. Skull base and vertebrae: The craniocervical junction is unremarkable. The 9 to dental interval is normal. No acute fracture of the cervical spine. Soft tissues and spinal canal: Internal contents of the spinal canal is not well assessed due to technical limitation. Similarly, the paraspinal soft tissues are not assessed. Disc levels: There is intervertebral disc space narrowing and endplate remodeling at C3-T1, most severe at C4-C7 in keeping with changes of moderate to severe degenerative disc disease. Vertebral body height has been preserved. Multilevel uncovertebral and facet arthrosis results in moderate bilateral neural foraminal narrowing at C3-4 and right neural foraminal narrowing at C4-5 and C5-6. Upper chest: Unremarkable Other: None IMPRESSION: Technically limited examination. No acute intracranial injury. No calvarial fracture. Chronic opacification of the left frontal and left maxillary sinuses. No acute fracture or listhesis of the cervical spine. Electronically Signed   By: Helyn Numbers MD   On: 12/24/2020 04:30   CT Cervical Spine Wo Contrast  Result Date: 12/24/2020 CLINICAL DATA:  Unwitnessed fall, facial trauma EXAM: CT HEAD WITHOUT CONTRAST CT CERVICAL SPINE WITHOUT CONTRAST TECHNIQUE: Multidetector CT imaging of the head and cervical spine was performed following the standard protocol without intravenous contrast. Multiplanar CT image reconstructions of the cervical spine were also generated. COMPARISON:  CT head 09/11/2020 FINDINGS: The examination is moderately limited by nonstandard patient positioning and resultant image degradation. CT HEAD FINDINGS Brain: Normal anatomic  configuration. Parenchymal volume loss is commensurate with the patient's age. Gray-white matter differentiation is poorly delineated as result of technical limitation. No abnormal intra or extra-axial mass lesion or fluid collection. No abnormal mass effect or midline shift. No definite evidence of acute intracranial hemorrhage or infarct. Ventricular size is normal. Cerebellum unremarkable. Vascular: No asymmetric hyperdense vasculature at the skull base. Skull: Intact Sinuses/Orbits: There is chronic opacification of the left frontal sinus and  left maxillary sinus. Remaining paranasal sinuses are clear. Orbits are unremarkable. Other: Mastoid air cells and middle ear cavities are clear. CT CERVICAL SPINE FINDINGS Alignment: Reversal of the normal cervical lordosis is likely related to patient positioning. No listhesis. Skull base and vertebrae: The craniocervical junction is unremarkable. The 9 to dental interval is normal. No acute fracture of the cervical spine. Soft tissues and spinal canal: Internal contents of the spinal canal is not well assessed due to technical limitation. Similarly, the paraspinal soft tissues are not assessed. Disc levels: There is intervertebral disc space narrowing and endplate remodeling at C3-T1, most severe at C4-C7 in keeping with changes of moderate to severe degenerative disc disease. Vertebral body height has been preserved. Multilevel uncovertebral and facet arthrosis results in moderate bilateral neural foraminal narrowing at C3-4 and right neural foraminal narrowing at C4-5 and C5-6. Upper chest: Unremarkable Other: None IMPRESSION: Technically limited examination. No acute intracranial injury. No calvarial fracture. Chronic opacification of the left frontal and left maxillary sinuses. No acute fracture or listhesis of the cervical spine. Electronically Signed   By: Helyn NumbersAshesh  Parikh MD   On: 12/24/2020 04:30   DG Humerus Left  Result Date: 12/24/2020 CLINICAL DATA:  Fall  with upper arm bruising. EXAM: LEFT HUMERUS - 2+ VIEW COMPARISON:  None. FINDINGS: There is no evidence of fracture or other focal bone lesions. Soft tissues are unremarkable. IMPRESSION: Negative. Electronically Signed   By: Marnee SpringJonathon  Watts M.D.   On: 12/24/2020 04:18    Procedures Procedures (including critical care time)  Medications Ordered in ED Medications  Tdap (BOOSTRIX) injection 0.5 mL (has no administration in time range)    ED Course  I have reviewed the triage vital signs and the nursing notes.  Pertinent labs & imaging results that were available during my care of the patient were reviewed by me and considered in my medical decision making (see chart for details).    Fall without pain or issues.  Stable for discharge  Craig Giles was evaluated in Emergency Department on 12/24/2020 for the symptoms described in the history of present illness. He was evaluated in the context of the global COVID-19 pandemic, which necessitated consideration that the patient might be at risk for infection with the SARS-CoV-2 virus that causes COVID-19. Institutional protocols and algorithms that pertain to the evaluation of patients at risk for COVID-19 are in a state of rapid change based on information released by regulatory bodies including the CDC and federal and state organizations. These policies and algorithms were followed during the patient's care in the ED.  Final Clinical Impression(s) / ED Diagnoses Final diagnoses:  Fall, initial encounter   Return for intractable cough, coughing up blood, fevers >100.4 unrelieved by medication, shortness of breath, intractable vomiting, chest pain, shortness of breath, weakness, numbness, changes in speech, facial asymmetry, abdominal pain, passing out, Inability to tolerate liquids or food, cough, altered mental status or any concerns. No signs of systemic illness or infection. The patient is nontoxic-appearing on exam and vital signs are  within normal limits.  I have reviewed the triage vital signs and the nursing notes. Pertinent labs & imaging results that were available during my care of the patient were reviewed by me and considered in my medical decision making (see chart for details). After history, exam, and medical workup I feel the patient has been appropriately medically screened and is safe for discharge home. Pertinent diagnoses were discussed with the patient. Patient was given return precautions.  Zsofia Prout, MD 12/24/20 [phone]

## 2021-02-28 ENCOUNTER — Encounter (HOSPITAL_COMMUNITY): Payer: Self-pay | Admitting: Emergency Medicine

## 2021-02-28 ENCOUNTER — Emergency Department (HOSPITAL_COMMUNITY)
Admission: EM | Admit: 2021-02-28 | Discharge: 2021-03-01 | Disposition: A | Discharge status: Home / Self Care | Payer: Medicare HMO | Attending: Emergency Medicine | Admitting: Emergency Medicine

## 2021-02-28 DIAGNOSIS — E039 Hypothyroidism, unspecified: Secondary | ICD-10-CM | POA: Diagnosis not present

## 2021-02-28 DIAGNOSIS — F0390 Unspecified dementia without behavioral disturbance: Secondary | ICD-10-CM | POA: Insufficient documentation

## 2021-02-28 DIAGNOSIS — R069 Unspecified abnormalities of breathing: Secondary | ICD-10-CM

## 2021-02-28 DIAGNOSIS — R0689 Other abnormalities of breathing: Secondary | ICD-10-CM | POA: Diagnosis present

## 2021-02-28 DIAGNOSIS — G20 Parkinson's disease: Secondary | ICD-10-CM | POA: Diagnosis not present

## 2021-02-28 DIAGNOSIS — Z79899 Other long term (current) drug therapy: Secondary | ICD-10-CM | POA: Insufficient documentation

## 2021-02-28 MED ORDER — DEXAMETHASONE SODIUM PHOSPHATE 10 MG/ML IJ SOLN
10.0000 mg | Freq: Once | INTRAMUSCULAR | Status: AC
  Administered 2021-03-01: 10 mg via INTRAVENOUS
  Filled 2021-02-28: qty 1

## 2021-02-28 MED ORDER — SODIUM CHLORIDE 0.9 % IV BOLUS
500.0000 mL | Freq: Once | INTRAVENOUS | Status: AC
  Administered 2021-03-01: 500 mL via INTRAVENOUS

## 2021-02-28 NOTE — ED Triage Notes (Signed)
Pt transported from [facility] in [place] for evaluation of abnormal breath sounds. Pt returned to facility from [facility] just before call to bring pt back to hospital. 02 sats 97%, audible upper airway noise can be heard. LS clear. Pt had xrays and evaluation for same today. Drooling noted, unsure if this is normal for patient, mental status at baseline.

## 2021-02-28 NOTE — ED Provider Notes (Signed)
[facility] EMERGENCY DEPARTMENT Provider Note   CSN: [id number] Arrival date & time: 02/28/21  2303     History Chief Complaint  Patient presents with  . shob    Craig Giles is a 87 y.o. male.  Patient is an 87 year old male with history of Parkinson's disease, community-acquired pneumonia, dementia, hypothyroidism.  Patient sent from his extended care facility for evaluation of noisy breathing.  Patient apparently had an episode of decreased responsiveness this afternoon, and then was sent to [facility] where he had extensive work-up performed.  Patient was found to be afebrile with no leukocytosis, but chest x-ray suggestive of possible pneumonia.  Antibiotics were not given secondary to lack of evidence of infection in his vital signs and laboratory studies.  Patient was returned to his extended care facility, then immediately sent here due to his noisy breathing.  Patient adds little additional history secondary to baseline Parkinson's/dementia.  When asked if he is having difficulty breathing or pain, he responds "no".  The history is provided by the patient.       Past Medical History:  Diagnosis Date  . Hypothyroidism   . Memory deficits 10/22/2013  . Parkinson disease (HCC)   . Pneumonia    "this is his 2nd time" (12/31/2017)  . 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 CAP (community acquired pneumonia) 12/31/2017  . Hypothyroidism 12/31/2017  . Dementia due to Parkinson's disease without behavioral disturbance (HCC)   . HCAP (healthcare-associated pneumonia)   . Generalized weakness 05/28/2017  . Indwelling Foley catheter present   . Rectal bleeding   . GI bleed 05/18/2017  . Pressure injury of skin 04/01/2017  . PNA (pneumonia) 03/31/2017  . Chronic insomnia 08/12/2016  . 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 Memory deficits 10/22/2013  . Abnormality of gait  11/10/2012  . Paralysis agitans (HCC) 11/10/2012    Past Surgical History:  Procedure Laterality Date  . CATARACT EXTRACTION W/ INTRAOCULAR LENS  IMPLANT, BILATERAL Bilateral   . TONSILLECTOMY         Family History  Problem Relation Age of Onset  . Diabetes Brother     Social History   Tobacco Use  . Smoking status: Never Smoker  . Smokeless tobacco: Never Used  Vaping Use  . Vaping Use: Never used  Substance Use Topics  . Alcohol use: No  . Drug use: No    Home Medications Prior to Admission medications   Medication Sig Start Date End Date Taking? Authorizing Provider  acetaminophen (TYLENOL) 325 MG tablet Take 650 mg by mouth every 6 (six) hours as needed for mild pain, moderate pain, fever or headache.     [provider]  alum & mag hydroxide-simeth (MAALOX/MYLANTA) 200-200-20 MG/5ML suspension Take 30 mLs by mouth every 6 (six) hours as needed for indigestion or heartburn.    [provider]  carbidopa-levodopa (SINEMET IR) 25-100 MG tablet Take 1 tablet by mouth 3 (three) times daily. 12/30/16   York Spaniel, MD  Elastic Bandages & Supports (MEDICAL COMPRESSION STOCKINGS) MISC 1 Package by Other route daily. Apply in the morning and take off in the afternoon    [provider]  finasteride (PROSCAR) 5 MG tablet Take 5 mg by mouth daily. 12/17/17   [provider]  guaifenesin (ROBITUSSIN) 100 MG/5ML syrup Take 200 mg by mouth 3 (three) times daily as needed for cough.    [provider]  Infant Care Products (DERMACLOUD) CREA Apply 1 application topically 3 (three) times daily. Apply to buttocks and perineal area with brief changes    [provider]  levothyroxine (SYNTHROID, LEVOTHROID) 88 MCG tablet Take 88 mcg by mouth daily before breakfast.     [provider]  loperamide (IMODIUM A-D) 2 MG tablet Take 2 mg by mouth 4 (four) times daily as needed for diarrhea or loose stools.    [provider]  loratadine (CLARITIN) 10 MG tablet Take 10 mg by mouth daily as needed for allergies.    [provider]  Melatonin 10 MG CAPS Take 10 mg by mouth at bedtime.    [provider]  methocarbamol (ROBAXIN) 500 MG tablet Take 500 mg by mouth at bedtime.    [provider]  Multiple Vitamin (MULTIVITAMIN WITH MINERALS) TABS tablet Take 1 tablet by mouth daily.    [provider]  polyethylene glycol (MIRALAX / GLYCOLAX) packet Take 17 g by mouth daily.    [provider]  ropinirole (REQUIP) 5 MG tablet Take 5 mg by mouth at bedtime.     [provider]  saccharomyces boulardii (FLORASTOR) 250 MG capsule Take 250 mg by mouth daily.    [provider]  tamsulosin (FLOMAX) 0.4 MG CAPS capsule Take 0.4 mg by mouth daily. 12/24/17   [provider]  traZODone (DESYREL) 100 MG tablet Take 1 tablet (100 mg total) by mouth at bedtime. 12/30/16   York Spaniel, MD  vitamin C (ASCORBIC ACID) 250 MG tablet Take 250 mg by mouth daily.    [provider]  zinc sulfate 220 (50 Zn) MG capsule Take 220 mg by mouth daily.    [provider]    Allergies    Patient has no known allergies.  Review of Systems   Review of Systems  All other systems reviewed and are negative.   Physical Exam Updated Vital Signs BP 121/79 (BP Location: Right Arm)   Pulse 68   Temp (!) 96.5 F (35.8 C) (Oral)   Resp (!) 22   Ht 5\' 4"  (1.626 m)   Wt 75 kg   SpO2 98%   BMI 28.38 kg/m   Physical Exam Vitals and nursing note reviewed.  Constitutional:      General: He is not in acute distress.    Appearance: He is well-developed. He is not diaphoretic.  HENT:     Head: Normocephalic and atraumatic.     Mouth/Throat:     Mouth: Mucous membranes are moist.     Comments: Patient has some drooling.  Oral examination is somewhat limited secondary to posture, but no obvious abnormality is noted. Neck:     Comments:  Patient with significant kyphosis.  His neck is held in continuous flexion. Cardiovascular:     Rate and Rhythm: Normal rate and regular rhythm.     Heart sounds: No murmur heard. No friction rub.  Pulmonary:     Effort: Pulmonary effort is normal. No respiratory distress.     Breath sounds: Normal breath sounds. No wheezing or rales.     Comments: Patient does have some snoring sounds with respiration, but no rales or respiratory distress.  Oxygen saturations are 97% on room air. Abdominal:     General: Bowel sounds are normal. There is no distension.     Palpations: Abdomen is soft.     Tenderness: There is no abdominal tenderness.  Musculoskeletal:  General: Normal range of motion.     Cervical back: Normal range of motion and neck supple.     Right lower leg: No edema.     Left lower leg: No edema.  Skin:    General: Skin is warm and dry.  Neurological:     Mental Status: He is alert and oriented to person, place, and time.     Coordination: Coordination normal.     ED Results / Procedures / Treatments   Labs (all labs ordered are listed, but only abnormal results are displayed) Labs Reviewed - No data to display  EKG None  Radiology No results found.  Procedures Procedures   Medications Ordered in ED Medications  sodium chloride 0.9 % bolus 500 mL (has no administration in time range)  dexamethasone (DECADRON) injection 10 mg (has no administration in time range)    ED Course  I have reviewed the triage vital signs and the nursing notes.  Pertinent labs & imaging results that were available during my care of the patient were reviewed by me and considered in my medical decision making (see chart for details).    MDM Rules/Calculators/A&P  Patient brought by EMS for evaluation of abnormal breath sounds.  Patient just had extensive work-up performed at [facility], but no significant abnormalities were found.  Upon arrival here, patient does  have some snoring breath sounds while he is sleeping, but seems to improve while he is awake.  His laboratory studies and imaging studies from [facility] were reviewed.  I elected to obtain a CT scan of the neck to rule out airway stenosis.  This exam was limited secondary to patient's kyphosis, but there was no airway stenosis or acute abnormality of the neck.  Patient has been observed here for 5-1/2 hours and has had no desaturations.  While he is sleeping, he does have snoring respirations but no stridor or respiratory distress.  Patient to be returned to his extended care facility.  Final Clinical Impression(s) / ED Diagnoses Final diagnoses:  None    Rx / DC Orders ED Discharge Orders    None       Geoffery Lyons, MD 03/01/21 [phone]

## 2021-03-01 ENCOUNTER — Emergency Department (HOSPITAL_COMMUNITY): Payer: Medicare HMO

## 2021-03-01 MED ORDER — IOHEXOL 300 MG/ML  SOLN
75.0000 mL | Freq: Once | INTRAMUSCULAR | Status: AC | PRN
  Administered 2021-03-01: 75 mL via INTRAVENOUS

## 2021-03-01 NOTE — ED Notes (Signed)
Called [facility] 

## 2021-03-01 NOTE — Discharge Instructions (Addendum)
Continue medications as previously prescribed.  Return to the emergency department for any new and/or concerning symptoms. 

## 2021-03-01 NOTE — ED Notes (Signed)
Pt in CT at this time.

## 2021-04-13 ENCOUNTER — Emergency Department (HOSPITAL_COMMUNITY)

## 2021-04-13 ENCOUNTER — Emergency Department (HOSPITAL_COMMUNITY)
Admission: EM | Admit: 2021-04-13 | Discharge: 2021-04-13 | Disposition: A | Discharge status: Home / Self Care | Source: Home / Self Care | Attending: Emergency Medicine | Admitting: Emergency Medicine

## 2021-04-13 ENCOUNTER — Other Ambulatory Visit: Payer: Self-pay

## 2021-04-13 ENCOUNTER — Encounter (HOSPITAL_COMMUNITY): Payer: Self-pay

## 2021-04-13 DIAGNOSIS — Z79899 Other long term (current) drug therapy: Secondary | ICD-10-CM | POA: Insufficient documentation

## 2021-04-13 DIAGNOSIS — G20 Parkinson's disease: Secondary | ICD-10-CM | POA: Insufficient documentation

## 2021-04-13 DIAGNOSIS — R0603 Acute respiratory distress: Secondary | ICD-10-CM | POA: Diagnosis not present

## 2021-04-13 DIAGNOSIS — R0600 Dyspnea, unspecified: Secondary | ICD-10-CM | POA: Insufficient documentation

## 2021-04-13 DIAGNOSIS — F0390 Unspecified dementia without behavioral disturbance: Secondary | ICD-10-CM | POA: Insufficient documentation

## 2021-04-13 DIAGNOSIS — E039 Hypothyroidism, unspecified: Secondary | ICD-10-CM | POA: Insufficient documentation

## 2021-04-13 DIAGNOSIS — J690 Pneumonitis due to inhalation of food and vomit: Secondary | ICD-10-CM | POA: Diagnosis not present

## 2021-04-13 NOTE — ED Notes (Signed)
Attempted to call [facility] in [facility]. No answer.

## 2021-04-13 NOTE — ED Provider Notes (Signed)
[facility]-EMERGENCY DEPT Provider Note   CSN: [id number] Arrival date & time: 04/13/21  7510     History Chief Complaint  Patient presents with  . Shortness of Breath   Level 5 caveat due to dementia Craig Giles is a 87 y.o. male.  The history is provided by the patient.  Patient with history of Parkinson's, hypothyroidism presents from nursing home reportedly for "breathing problems" Patient does not provide any history.  EMS reports patient was 94% on room air.  There is no other history provided from the nursing facility.  Patient is not providing history at this time    Patient is a DNR Past Medical History:  Diagnosis Date  . Hypothyroidism   . Memory deficits 10/22/2013  . Parkinson disease (HCC)   . Pneumonia    "this is his 2nd time" (12/31/2017)  . 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 CAP (community acquired pneumonia) 12/31/2017  . Hypothyroidism 12/31/2017  . Dementia due to Parkinson's disease without behavioral disturbance (HCC)   . HCAP (healthcare-associated pneumonia)   . Generalized weakness 05/28/2017  . Indwelling Foley catheter present   . Rectal bleeding   . GI bleed 05/18/2017  . Pressure injury of skin 04/01/2017  . PNA (pneumonia) 03/31/2017  . Chronic insomnia 08/12/2016  . 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 Memory deficits 10/22/2013  . Abnormality of gait 11/10/2012  . Paralysis agitans (HCC) 11/10/2012    Past Surgical History:  Procedure Laterality Date  . CATARACT EXTRACTION W/ INTRAOCULAR LENS  IMPLANT, BILATERAL Bilateral   . TONSILLECTOMY         Family History  Problem Relation Age of Onset  . Diabetes Brother     Social History   Tobacco Use  . Smoking status: Never Smoker  . Smokeless tobacco: Never Used  Vaping Use  . Vaping Use: Never used  Substance Use Topics  . Alcohol use: No  . Drug use: No    Home  Medications Prior to Admission medications   Medication Sig Start Date End Date Taking? Authorizing Provider  acetaminophen (TYLENOL) 325 MG tablet Take 650 mg by mouth every 6 (six) hours as needed for mild pain, moderate pain, fever or headache.    Yes [provider]  carbidopa-levodopa (SINEMET IR) 25-100 MG tablet Take 1 tablet by mouth 3 (three) times daily. 12/30/16  Yes York Spaniel, MD  finasteride (PROSCAR) 5 MG tablet Take 5 mg by mouth daily. 12/17/17  Yes [provider]  levothyroxine (SYNTHROID) 100 MCG tablet Take 100 mcg by mouth daily before breakfast.   Yes [provider]  Melatonin 10 MG CAPS Take 10 mg by mouth at bedtime.   Yes [provider]  methocarbamol (ROBAXIN) 500 MG tablet Take 500 mg by mouth at bedtime.   Yes [provider]  polyethylene glycol (MIRALAX / GLYCOLAX) packet Take 17 g by mouth daily.   Yes [provider]  ropinirole (REQUIP) 5 MG tablet Take 5 mg by mouth daily.   Yes [provider]  saccharomyces boulardii (FLORASTOR) 250 MG capsule Take 250 mg by mouth 2 (two) times daily.   Yes [provider]  tamsulosin (FLOMAX) 0.4 MG CAPS capsule Take 0.4 mg by mouth daily. 12/24/17  Yes [provider]  traZODone (DESYREL) 50 MG tablet Take 50 mg by mouth at bedtime.   Yes [provider]  Elastic Bandages &  Supports (MEDICAL COMPRESSION STOCKINGS) MISC 1 Package by Other route daily. Apply in the morning and take off in the afternoon    [provider]  loratadine (CLARITIN) 10 MG tablet Take 10 mg by mouth daily as needed for allergies.    [provider]  traZODone (DESYREL) 100 MG tablet Take 1 tablet (100 mg total) by mouth at bedtime. 12/30/16   York Spaniel, MD    Allergies    Patient has no known allergies.  Review of Systems   Review of Systems  Unable to perform ROS: Dementia    Physical Exam Updated Vital Signs BP 91/60    Pulse 63   Temp 97.8 F (36.6 C) (Axillary)   Resp 14   Ht 1.626 m (5\' 4" )   Wt 75 kg   SpO2 99%   BMI 28.38 kg/m   Physical Exam CONSTITUTIONAL: Chronically ill-appearing HEAD: Normocephalic/atraumatic EYES: EOMI/PERRL ENMT: Mucous membranes moist, patient drooling NECK: Neck is in continuous flexion SPINE/BACK: Significant kyphosis CV: S1/S2 noted, no murmurs/rubs/gallops noted LUNGS: Difficult to auscultate lungs, but overall clear, no hypoxia ABDOMEN: soft, nontender GU:no cva tenderness NEURO: Pt appears somnolent.  Does not respond to voice to my initial evaluation.  Patient does grunt to pain EXTREMITIES: pulses normal/equal SKIN: warm, color normal PSYCH: Unable to assess  ED Results / Procedures / Treatments   Labs (all labs ordered are listed, but only abnormal results are displayed) Labs Reviewed - No data to display  EKG EKG Interpretation  Date/Time:  Friday Apr 13 2021 04:05:52 EDT Ventricular Rate:  59 PR Interval:  181 QRS Duration: 112 QT Interval:  455 QTC Calculation: 451 R Axis:   -22 Text Interpretation: Sinus rhythm Low voltage, precordial leads Consider anterior infarct Confirmed by 04-18-1981 (Zadie Rhine) on 04/13/2021 4:17:47 AM   Radiology No results found.  Procedures Procedures   Medications Ordered in ED Medications - No data to display  ED Course  I have reviewed the triage vital signs and the nursing notes.  Pertinent labs & imaging results that were available during my care of the patient were reviewed by me and considered in my medical decision making (see chart for details).    MDM Rules/Calculators/A&P                          4:26 AM Patient was sent in from a nursing home for having breathing problems.  On exam patient has significant kyphosis with his head and neck continuously flexed.  This limits his exam  he was not responding on initial examination However he is now awake and alert refusing all care.  I  attempted to call the facility and no one answered 4:31 AM Patient reports he does not want any testing at this time.  He is awake and alert.  When asked him how he felt and he says "better than ever" Will make attempts to call his facility 4:55 AM Patient denies any complaints.  No hypoxia.  Patient has been sent to Emergency Department previously for "noisy breathing" which is likely due to his severe kyphosis and neck flexion.  Patient is in no respiratory distress at this time.  He is refusing all care at this time.  Patient will be discharged back to facility Final Clinical Impression(s) / ED Diagnoses Final diagnoses:  Dyspnea, unspecified type    Rx / DC Orders ED Discharge Orders    None       Natasha Burda,  Dorinda Hill, MD 04/13/21 [phone]

## 2021-04-13 NOTE — ED Triage Notes (Signed)
Patient BIB [facility] from [facility] in [facility] presenting with breathing problems. Patient presents with kyphosis. 94% Room Air, lung sounds clear. Patient has dementia, Parkinsons and is DNR.   EMS vitals 102/60 HR 70

## 2021-04-13 NOTE — ED Notes (Signed)
Attempted to get patients blood work. Patient said "No I dont want any of that done. Who is that mans name who tried to stick me (Respiratory Therapist), I dont want anything." Dr. Bebe Shaggy at bedside.

## 2021-04-13 NOTE — ED Notes (Signed)
Called [facility] for transport. Paperwork printed.

## 2021-04-14 ENCOUNTER — Encounter (HOSPITAL_COMMUNITY): Payer: Self-pay

## 2021-04-14 ENCOUNTER — Inpatient Hospital Stay (HOSPITAL_COMMUNITY)
Admission: EM | Admit: 2021-04-14 | Discharge: 2021-05-02 | DRG: 177 | Disposition: E | Attending: Internal Medicine | Admitting: Internal Medicine

## 2021-04-14 ENCOUNTER — Emergency Department (HOSPITAL_COMMUNITY)

## 2021-04-14 ENCOUNTER — Other Ambulatory Visit: Payer: Self-pay

## 2021-04-14 DIAGNOSIS — R0603 Acute respiratory distress: Secondary | ICD-10-CM

## 2021-04-14 DIAGNOSIS — G4709 Other insomnia: Secondary | ICD-10-CM | POA: Diagnosis present

## 2021-04-14 DIAGNOSIS — Z79890 Hormone replacement therapy (postmenopausal): Secondary | ICD-10-CM

## 2021-04-14 DIAGNOSIS — G20 Parkinson's disease: Secondary | ICD-10-CM | POA: Diagnosis present

## 2021-04-14 DIAGNOSIS — G9341 Metabolic encephalopathy: Secondary | ICD-10-CM | POA: Diagnosis present

## 2021-04-14 DIAGNOSIS — R1310 Dysphagia, unspecified: Secondary | ICD-10-CM | POA: Diagnosis present

## 2021-04-14 DIAGNOSIS — Z79899 Other long term (current) drug therapy: Secondary | ICD-10-CM | POA: Diagnosis not present

## 2021-04-14 DIAGNOSIS — Z66 Do not resuscitate: Secondary | ICD-10-CM | POA: Diagnosis present

## 2021-04-14 DIAGNOSIS — Z515 Encounter for palliative care: Secondary | ICD-10-CM | POA: Diagnosis not present

## 2021-04-14 DIAGNOSIS — J690 Pneumonitis due to inhalation of food and vomit: Secondary | ICD-10-CM | POA: Diagnosis present

## 2021-04-14 DIAGNOSIS — F0280 Dementia in other diseases classified elsewhere without behavioral disturbance: Secondary | ICD-10-CM | POA: Diagnosis present

## 2021-04-14 DIAGNOSIS — M4004 Postural kyphosis, thoracic region: Secondary | ICD-10-CM | POA: Diagnosis not present

## 2021-04-14 DIAGNOSIS — J90 Pleural effusion, not elsewhere classified: Secondary | ICD-10-CM | POA: Diagnosis present

## 2021-04-14 DIAGNOSIS — Z20822 Contact with and (suspected) exposure to covid-19: Secondary | ICD-10-CM | POA: Diagnosis present

## 2021-04-14 DIAGNOSIS — E039 Hypothyroidism, unspecified: Secondary | ICD-10-CM | POA: Diagnosis present

## 2021-04-14 DIAGNOSIS — Z79891 Long term (current) use of opiate analgesic: Secondary | ICD-10-CM

## 2021-04-14 DIAGNOSIS — J9601 Acute respiratory failure with hypoxia: Secondary | ICD-10-CM | POA: Diagnosis present

## 2021-04-14 DIAGNOSIS — G20A1 Parkinson's disease without dyskinesia, without mention of fluctuations: Secondary | ICD-10-CM | POA: Diagnosis present

## 2021-04-14 DIAGNOSIS — R0902 Hypoxemia: Secondary | ICD-10-CM

## 2021-04-14 DIAGNOSIS — M40204 Unspecified kyphosis, thoracic region: Secondary | ICD-10-CM | POA: Diagnosis present

## 2021-04-14 LAB — BASIC METABOLIC PANEL
Anion gap: 8 (ref 5–15)
BUN: 21 mg/dL (ref 8–23)
CO2: 24 mmol/L (ref 22–32)
Calcium: 8.8 mg/dL — ABNORMAL LOW (ref 8.9–10.3)
Chloride: 106 mmol/L (ref 98–111)
Creatinine, Ser: 1.2 mg/dL (ref 0.61–1.24)
GFR, Estimated: 58 mL/min — ABNORMAL LOW (ref >60)
Glucose, Bld: 97 mg/dL (ref 70–99)
Potassium: 4.8 mmol/L (ref 3.5–5.1)
Sodium: 138 mmol/L (ref 135–145)

## 2021-04-14 LAB — RESP PANEL BY RT-PCR (FLU A&B, COVID) ARPGX2
Influenza A by PCR: NEGATIVE
Influenza B by PCR: NEGATIVE
SARS Coronavirus 2 by RT PCR: NEGATIVE

## 2021-04-14 LAB — CBC WITH DIFFERENTIAL/PLATELET
Abs Immature Granulocytes: 0.04 10*3/uL (ref 0.00–0.07)
Basophils Absolute: 0.1 10*3/uL (ref 0.0–0.1)
Basophils Relative: 1 %
Eosinophils Absolute: 0.1 10*3/uL (ref 0.0–0.5)
Eosinophils Relative: 1 %
HCT: 43.8 % (ref 39.0–52.0)
Hemoglobin: 13.4 g/dL (ref 13.0–17.0)
Immature Granulocytes: 0 %
Lymphocytes Relative: 10 %
Lymphs Abs: 0.9 10*3/uL (ref 0.7–4.0)
MCH: 29.5 pg (ref 26.0–34.0)
MCHC: 30.6 g/dL (ref 30.0–36.0)
MCV: 96.5 fL (ref 80.0–100.0)
Monocytes Absolute: 0.6 10*3/uL (ref 0.1–1.0)
Monocytes Relative: 7 %
Neutro Abs: 7.5 10*3/uL (ref 1.7–7.7)
Neutrophils Relative %: 81 %
Platelets: 165 10*3/uL (ref 150–400)
RBC: 4.54 MIL/uL (ref 4.22–5.81)
RDW: 14.6 % (ref 11.5–15.5)
WBC: 9.2 10*3/uL (ref 4.0–10.5)
nRBC: 0 % (ref 0.0–0.2)

## 2021-04-14 LAB — LACTIC ACID, PLASMA: Lactic Acid, Venous: 1.4 mmol/L (ref 0.5–1.9)

## 2021-04-14 MED ORDER — ALBUTEROL SULFATE (2.5 MG/3ML) 0.083% IN NEBU
2.5000 mg | INHALATION_SOLUTION | RESPIRATORY_TRACT | Status: DC | PRN
Start: 2021-04-14 — End: 2021-04-15

## 2021-04-14 MED ORDER — BIOTENE DRY MOUTH MT LIQD: 15.0000 mL | Freq: Two times a day (BID) | OROMUCOSAL | Status: DC

## 2021-04-14 MED ORDER — GLYCOPYRROLATE 0.2 MG/ML IJ SOLN
0.2000 mg | INTRAMUSCULAR | Status: DC | PRN
  Administered 2021-04-14: 0.2 mg via INTRAVENOUS
  Filled 2021-04-14: qty 1

## 2021-04-14 MED ORDER — GLYCOPYRROLATE 0.2 MG/ML IJ SOLN: 0.2000 mg | INTRAMUSCULAR | Status: DC | PRN

## 2021-04-14 MED ORDER — ACETAMINOPHEN 650 MG RE SUPP: 650.0000 mg | Freq: Four times a day (QID) | RECTAL | Status: DC | PRN

## 2021-04-14 MED ORDER — BIOTENE DRY MOUTH MT LIQD: 15.0000 mL | OROMUCOSAL | Status: DC | PRN

## 2021-04-14 MED ORDER — HALOPERIDOL 0.5 MG PO TABS
0.5000 mg | ORAL_TABLET | ORAL | Status: DC | PRN
  Filled 2021-04-14: qty 1

## 2021-04-14 MED ORDER — LORAZEPAM 2 MG/ML IJ SOLN: 1.0000 mg | INTRAMUSCULAR | Status: DC | PRN

## 2021-04-14 MED ORDER — POLYVINYL ALCOHOL 1.4 % OP SOLN
1.0000 [drp] | Freq: Four times a day (QID) | OPHTHALMIC | Status: DC | PRN
  Filled 2021-04-14: qty 15

## 2021-04-14 MED ORDER — LORAZEPAM 1 MG PO TABS: 1.0000 mg | ORAL_TABLET | ORAL | Status: DC | PRN

## 2021-04-14 MED ORDER — PIPERACILLIN-TAZOBACTAM 3.375 G IVPB: 3.3750 g | Freq: Three times a day (TID) | INTRAVENOUS | Status: DC

## 2021-04-14 MED ORDER — PIPERACILLIN-TAZOBACTAM 3.375 G IVPB 30 MIN
3.3750 g | INTRAVENOUS | Status: AC
  Administered 2021-04-14: 3.375 g via INTRAVENOUS
  Filled 2021-04-14: qty 50

## 2021-04-14 MED ORDER — GLYCOPYRROLATE 1 MG PO TABS
1.0000 mg | ORAL_TABLET | ORAL | Status: DC | PRN
  Filled 2021-04-14: qty 1

## 2021-04-14 MED ORDER — HALOPERIDOL LACTATE 5 MG/ML IJ SOLN: 0.5000 mg | INTRAMUSCULAR | Status: DC | PRN

## 2021-04-14 MED ORDER — MORPHINE SULFATE (PF) 2 MG/ML IV SOLN
1.0000 mg | INTRAVENOUS | Status: DC | PRN
Start: 2021-04-14 — End: 2021-04-15

## 2021-04-14 MED ORDER — LORAZEPAM 2 MG/ML PO CONC
1.0000 mg | ORAL | Status: DC | PRN
Start: 2021-04-14 — End: 2021-04-15

## 2021-04-14 MED ORDER — MORPHINE SULFATE (PF) 2 MG/ML IV SOLN
1.0000 mg | INTRAVENOUS | Status: DC | PRN
  Administered 2021-04-14: 1 mg via INTRAVENOUS
  Filled 2021-04-14: qty 1

## 2021-04-14 MED ORDER — LORAZEPAM 2 MG/ML PO CONC: 1.0000 mg | ORAL | Status: DC | PRN

## 2021-04-14 MED ORDER — ACETAMINOPHEN 325 MG PO TABS: 650.0000 mg | ORAL_TABLET | Freq: Four times a day (QID) | ORAL | Status: DC | PRN

## 2021-04-14 MED ORDER — ONDANSETRON 4 MG PO TBDP: 4.0000 mg | ORAL_TABLET | Freq: Four times a day (QID) | ORAL | Status: DC | PRN

## 2021-04-14 MED ORDER — HALOPERIDOL LACTATE 2 MG/ML PO CONC
0.5000 mg | ORAL | Status: DC | PRN
  Filled 2021-04-14: qty 0.3

## 2021-04-14 MED ORDER — ONDANSETRON HCL 4 MG/2ML IJ SOLN: 4.0000 mg | Freq: Four times a day (QID) | INTRAMUSCULAR | Status: DC | PRN

## 2021-04-19 LAB — CULTURE, BLOOD (ROUTINE X 2)
Culture: NO GROWTH
Culture: NO GROWTH

## 2021-05-02 NOTE — ED Provider Notes (Signed)
[facility] EMERGENCY DEPARTMENT Provider Note  CSN: [id number] Arrival date & time: Apr 24, 2021 0104  Chief Complaint(s) Respiratory Distress  HPI Craig Giles is a 87 y.o. male here from skilled nursing facility for hypoxia. Patient has a history of Parkinson's disease and severe kyphosis. Normally on room air. Patient reportedly was placed on oxygen trial due to her breathing with. Found to be hypoxic down to the 50s when evaluated earlier this evening.  This prompted a call to EMS who confirmed hypoxia.  They report that the patient recovered slowly after being placed on nonrebreather.  Remainder of history, ROS, and physical exam limited due to patient's condition (dementia). Additional information was obtained from EMS.   Level V Caveat.    HPI  Past Medical History Past Medical History:  Diagnosis Date  . Hypothyroidism   . Memory deficits 10/22/2013  . Parkinson disease (HCC)   . Pneumonia    "this is his 2nd time" (12/31/2017)  . 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 Acute respiratory failure with hypoxia (HCC) 2021-04-24  . CAP (community acquired pneumonia) 12/31/2017  . Hypothyroidism 12/31/2017  . Dementia due to Parkinson's disease without behavioral disturbance (HCC)   . HCAP (healthcare-associated pneumonia)   . Generalized weakness 05/28/2017  . Indwelling Foley catheter present   . Rectal bleeding   . GI bleed 05/18/2017  . Pressure injury of skin 04/01/2017  . PNA (pneumonia) 03/31/2017  . Chronic insomnia 08/12/2016  . 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 Memory deficits 10/22/2013  . Abnormality of gait 11/10/2012  . Paralysis agitans (HCC) 11/10/2012   Home Medication(s) Prior to Admission medications   Medication Sig Start Date End Date Taking? Authorizing Provider  acetaminophen (TYLENOL) 325 MG tablet Take 650 mg by mouth every 6 (six) hours as needed for  mild pain, moderate pain, fever or headache.     [provider]  carbidopa-levodopa (SINEMET IR) 25-100 MG tablet Take 1 tablet by mouth 3 (three) times daily. 12/30/16   York Spaniel, MD  Elastic Bandages & Supports (MEDICAL COMPRESSION STOCKINGS) MISC 1 Package by Other route daily. Apply in the morning and take off in the afternoon    [provider]  finasteride (PROSCAR) 5 MG tablet Take 5 mg by mouth daily. 12/17/17   [provider]  levothyroxine (SYNTHROID) 100 MCG tablet Take 100 mcg by mouth daily before breakfast.    [provider]  loratadine (CLARITIN) 10 MG tablet Take 10 mg by mouth daily as needed for allergies.    [provider]  Melatonin 10 MG CAPS Take 10 mg by mouth at bedtime.    [provider]  methocarbamol (ROBAXIN) 500 MG tablet Take 500 mg by mouth at bedtime.    [provider]  polyethylene glycol (MIRALAX / GLYCOLAX) packet Take 17 g by mouth daily.    [provider]  ropinirole (REQUIP) 5 MG tablet Take 5 mg by mouth daily.    [provider]  saccharomyces boulardii (FLORASTOR) 250 MG capsule Take 250 mg by mouth 2 (two) times daily.    [provider]  tamsulosin (FLOMAX) 0.4 MG CAPS capsule Take 0.4 mg by mouth daily. 12/24/17   [provider]  traZODone (DESYREL) 100 MG tablet Take 1 tablet (100 mg total) by mouth at bedtime. 12/30/16   York Spaniel, MD  traZODone (DESYREL) 50 MG tablet Take 50 mg by mouth  at bedtime.    [provider]                                                                                                                                    Past Surgical History Past Surgical History:  Procedure Laterality Date  . CATARACT EXTRACTION W/ INTRAOCULAR LENS  IMPLANT, BILATERAL Bilateral   . TONSILLECTOMY     Family History Family History  Problem Relation Age of Onset  . Diabetes Brother     Social History Social  History   Tobacco Use  . Smoking status: Never Smoker  . Smokeless tobacco: Never Used  Vaping Use  . Vaping Use: Never used  Substance Use Topics  . Alcohol use: No  . Drug use: No   Allergies Patient has no known allergies.  Review of Systems Review of Systems  Unable to perform ROS: Dementia    Physical Exam Vital Signs  I have reviewed the triage vital signs BP 115/74   Pulse 63   Temp 98.2 F (36.8 C) (Axillary)   Resp 14   SpO2 100%   Physical Exam Vitals reviewed.  Constitutional:      General: He is not in acute distress.    Appearance: He is well-developed. He is not diaphoretic.  HENT:     Head: Normocephalic and atraumatic.     Nose: Nose normal.  Eyes:     General: No scleral icterus.       Right eye: No discharge.        Left eye: No discharge.     Conjunctiva/sclera: Conjunctivae normal.     Pupils: Pupils are equal, round, and reactive to light.  Cardiovascular:     Rate and Rhythm: Normal rate and regular rhythm.     Heart sounds: No murmur heard. No friction rub. No gallop.   Pulmonary:     Effort: Pulmonary effort is normal. Tachypnea present. No respiratory distress.     Breath sounds: No stridor. Examination of the left-middle field reveals rhonchi. Examination of the right-lower field reveals rhonchi. Examination of the left-lower field reveals rhonchi. Rhonchi present. No rales.  Abdominal:     General: There is no distension.     Palpations: Abdomen is soft.     Tenderness: There is no abdominal tenderness.  Musculoskeletal:        General: No tenderness.     Cervical back: Normal range of motion and neck supple.  Skin:    General: Skin is warm and dry.     Findings: No erythema or rash.  Neurological:     Mental Status: He is alert.     ED Results and Treatments Labs (all labs ordered are listed, but only abnormal results are displayed) Labs Reviewed  BASIC METABOLIC PANEL - Abnormal; Notable for the following components:       Result Value   Calcium 8.8 (*)    GFR,  Estimated 58 (*)    All other components within normal limits  CULTURE, BLOOD (ROUTINE X 2)  CULTURE, BLOOD (ROUTINE X 2)  RESP PANEL BY RT-PCR (FLU A&B, COVID) ARPGX2  CBC WITH DIFFERENTIAL/PLATELET  LACTIC ACID, PLASMA                                                                                                                         EKG  EKG Interpretation  Date/Time:  Saturday 05-09-2021 01:12:12 EDT Ventricular Rate:  72 PR Interval:  174 QRS Duration: 115 QT Interval:  421 QTC Calculation: 461 R Axis:   -26 Text Interpretation: Sinus rhythm Nonspecific intraventricular conduction delay Low voltage, precordial leads Confirmed by Drema Pry [phone]) on 05/09/2021 5:27:15 AM      Radiology DG Chest Port 1 View  Result Date: May 09, 2021 CLINICAL DATA:  Mental status change.  Oxygen in the 40s. EXAM: PORTABLE CHEST 1 VIEW.  Patient is rotated and angulated forward. COMPARISON:  Chest x-ray 04/13/2021 FINDINGS: Enlarged cardiac silhouette. The heart size and mediastinal contours are unchanged. Markedly limited evaluation due to overlying mandible. Bibasilar streaky airspace opacities. Lingular linear atelectasis. No focal consolidation. No pulmonary edema. Possible trace right pleural effusion. No pneumothorax. No acute osseous abnormality. IMPRESSION: 1. Bibasilar streaky airspace opacities. 2. Possible interval development of a trace right pleural effusion. 3. Persistently enlarged cardiac silhouette. 4. Markedly limited evaluation due to overlying mandible. Electronically Signed   By: Tish Frederickson M.D.   On: 05/09/2021 02:48    Pertinent labs & imaging results that were available during my care of the patient were reviewed by me and considered in my medical decision making (see chart for details).  Medications Ordered in ED Medications  piperacillin-tazobactam (ZOSYN) IVPB 3.375 g (has no administration in time range)   piperacillin-tazobactam (ZOSYN) IVPB 3.375 g (0 g Intravenous Stopped May 09, 2021 1914)                                                                                                                                    Procedures .1-3 Lead EKG Interpretation Performed by: Nira Conn, MD Authorized by: Nira Conn, MD     Interpretation: normal     ECG rate:  78   ECG rate assessment: normal     Rhythm: sinus rhythm     Conduction: normal   .Critical Care Performed by: Nira Conn,  MD Authorized by: Nira Conn, MD   Critical care provider statement:    Critical care time (minutes):  60   Critical care was necessary to treat or prevent imminent or life-threatening deterioration of the following conditions:  Respiratory failure   Critical care was time spent personally by me on the following activities:  Discussions with consultants, evaluation of patient's response to treatment, examination of patient, ordering and performing treatments and interventions, ordering and review of laboratory studies, ordering and review of radiographic studies, pulse oximetry, re-evaluation of patient's condition, obtaining history from patient or surrogate and review of old charts    (including critical care time)  Medical Decision Making / ED Course I have reviewed the nursing notes for this encounter and the patient's prior records (if available in EHR or on provided paperwork).   Cleon Signorelli was evaluated in Emergency Department on Apr 16, 2021 for the symptoms described in the history of present illness. He was evaluated in the context of the global COVID-19 pandemic, which necessitated consideration that the patient might be at risk for infection with the SARS-CoV-2 virus that causes COVID-19. Institutional protocols and algorithms that pertain to the evaluation of patients at risk for COVID-19 are in a state of rapid change based on information released by  regulatory bodies including the CDC and federal and state organizations. These policies and algorithms were followed during the patient's care in the ED.  Hypoxia. CXR with bibasilar opacities concerning for aspiration. Patient is DNR/DNI and has MOST form noting approval to use Abx and IVF. Started on zosyn for likely aspiration. Remained stable on NRB. Noted to drop to 50-60s when taken off. Labs reassuring w/o leukocytosis or anemia.  No significant electrolyte derangements or renal insufficiency.  Admitted to medicine      Final Clinical Impression(s) / ED Diagnoses Final diagnoses:  Hypoxia  Respiratory distress  Aspiration pneumonia of both lower lobes, unspecified aspiration pneumonia type [facility])     This chart was dictated using voice recognition software.  Despite best efforts to proofread,  errors can occur which can change the documentation meaning.   Nira Conn, MD Apr 16, 2021 [phone]

## 2021-05-02 NOTE — Progress Notes (Signed)
Received a call from bedside RN Ms Marylin Crosby about the patient had expired.  Anticipated hospital death on comfort care measures.   [name] expired on 2021-05-12 at 20:45 PM.  There were no family members at bedside.

## 2021-05-02 NOTE — Progress Notes (Signed)
Pharmacy Antibiotic Note  Aneesh Giles is a 87 y.o. male admitted on 2021-05-10 with asp pna.  Pharmacy has been consulted for Zosyn dosing.  Plan: Zosyn 3.375gm IV q8h Will f/u renal function, micro data, and pt's clinical condition     Temp (24hrs), Avg:98.1 F (36.7 C), Min:98 F (36.7 C), Max:98.2 F (36.8 C)  No results for input(s): WBC, CREATININE, LATICACIDVEN, VANCOTROUGH, VANCOPEAK, VANCORANDOM, GENTTROUGH, GENTPEAK, GENTRANDOM, TOBRATROUGH, TOBRAPEAK, TOBRARND, AMIKACINPEAK, AMIKACINTROU, AMIKACIN in the last 168 hours.  CrCl cannot be calculated (Patient's most recent lab result is older than the maximum 21 days allowed.).    No Known Allergies  Antimicrobials this admission: 5/14 Zosyn >>   Microbiology results: Pending  Thank you for allowing pharmacy to be a part of this patient's care.  Craig Giles, PharmD, BCPS Please see amion for complete clinical pharmacist phone list 05/10/21 4:15 AM

## 2021-05-02 NOTE — ED Triage Notes (Signed)
BIB EMS after altered mental status and oxygen in the 40's.  Was seen yesterday by Bebe Shaggy at [facility].  Patient has severe kyphosis.  Patient HR was 170's.  Patient on NRB at 15L 99% HR 130s with EMS.  Patient is hospice and DNR

## 2021-05-02 NOTE — Consult Note (Signed)
Brief Palliative Medicine Progress Note:  PMT consult received and chart reviewed.   Noted Craig Giles is a current hospice patient with [employer] [facility] I spoke with [facility] liaison, Victorino Dike, who is aware the patient is in the ED. She has already been to see the patient and family to discuss GOC as they have an established relationship. [facility] liaison does not feel PMT involvement is needed at this time and state patient will likely be GIP admission. Discussed reviewing comfort medications/orders for EOL.   Recommendations/Plan:  Comfort medications have already been ordered  Adjusted ativan to 1mg  IV every hour as needed for anxiety, seizure, sedation, sleep  Adjusted morphine 1mg  IV every 2 hours as needed for pain, dyspnea, increased work of breathing, respiratory rate >25, distress  Requested transfer to 6N  Bladder scan every shift - if >261ml place foley for EOL comfort  Discontinued MBS - son was clear per notes he would want patient to receive regular diet for comfort feeds with understanding of aspiration risk  Discontinue cardiac monitoring  Regular diet - may eat and drink as tolerated/desires for EOL - careful hand feed  Biotene scheduled BID - use mouth swab  Recommend not to titrate oxygen based on saturations - utilize opioids and/or benzos for increased work of breathing, respiratory rate >25, dyspnea, distress.   [facility] will reach out to PMT directly if any needs arise.   Thank you for allowing PMT to assist in the care of this patient.  Craig Giles M. , FNP-BC Palliative Medicine Team Team Phone: [phone] Total time: 32 minutes  Greater than 50%  of this time was spent counseling and coordinating care related to the above assessment and plan.

## 2021-05-02 NOTE — Progress Notes (Addendum)
Patient's son would like to the patient to be able to have comfort feeds if he likes despite the risks of possible aspiration.  Order placed for comfort feeds as requested by the patient's son.

## 2021-05-02 NOTE — Discharge Summary (Signed)
Death Summary  Craig Giles YTK:[id number] DOB: 1931-10-26 DOA: Apr 29, 2021  PCP: [facility]  Admit date: 04/29/2021 Date of Death: 04-29-2021 Time of Death: 20:45 Notification: [facility] notified of death of 04/30/21   History of present illness:  Craig Giles is a 87 y.o. male with a history of Parkinson's disease with dementia, aspiration pneumonia, hypothyroidism, and severe kyphosis  Craig Giles presented with complaint of shortness of breath Craig Giles  was found have labored breathing with O2 saturations reportedly in 40s with improvement after being placed on nonrebreather mask at 15 L.  Labs are relatively unremarkable.  Chest x-ray gave concern for streaky bibasilar airspace opacities, persistently enlarged cardiac silhouette, and development of interval trace pleural effusion.  Suspect patient's acute respiratory failure with hypoxia is likely related with aspiration pneumonia and/or aspect of congestive heart failure.  On physical exam he does have severe thoracic kyphosis rhonchi in the mid to lower lung fields and is currently not really arousable or able to follow commands.  Son agreed to comfort care and wanted patient to be allowed to eat.  Palliative care made necessary adjustments to initial orders.  Patient was reported to awaken later on in the hospital day and was able to drink some fluid and eat some applesauce.  At 20:45 patient was noted to have expired.  Final Diagnoses:  Acute respiratory failure with hypoxia Aspiration pneumonia Dysphagia Parkinson's disease with dementia Thoracic kyphosis Acute metabolic encephalopathy Congestive heart failure Hypothyroidism DNR/DNI Comfort measures only   The results of significant diagnostics from this hospitalization (including imaging, microbiology, ancillary and laboratory) are listed below for reference.    Significant Diagnostic Studies: DG Chest Port 1  View  Result Date: Apr 29, 2021 CLINICAL DATA:  Mental status change.  Oxygen in the 40s. EXAM: PORTABLE CHEST 1 VIEW.  Patient is rotated and angulated forward. COMPARISON:  Chest x-ray 04/13/2021 FINDINGS: Enlarged cardiac silhouette. The heart size and mediastinal contours are unchanged. Markedly limited evaluation due to overlying mandible. Bibasilar streaky airspace opacities. Lingular linear atelectasis. No focal consolidation. No pulmonary edema. Possible trace right pleural effusion. No pneumothorax. No acute osseous abnormality. IMPRESSION: 1. Bibasilar streaky airspace opacities. 2. Possible interval development of a trace right pleural effusion. 3. Persistently enlarged cardiac silhouette. 4. Markedly limited evaluation due to overlying mandible. Electronically Signed   By: Tish Frederickson M.D.   On: 04/29/21 02:48   DG Chest Port 1 View  Result Date: 04/13/2021 CLINICAL DATA:  Shortness of breath EXAM: PORTABLE CHEST 1 VIEW COMPARISON:  02/28/2021 FINDINGS: New very limited study due to the degree of kyphosis and head overlapping the chest. Scarring seen on the left. No air bronchogram, Kerley lines, visible effusion, or gross air leak. Cardiomegaly. IMPRESSION: 1. Very limited due to the degree of kyphosis. 2. Pulmonary scarring without acute finding. Electronically Signed   By: Marnee Spring M.D.   On: 04/13/2021 04:27    Microbiology: Recent Results (from the past 240 hour(s))  Blood culture (routine x 2)     Status: None (Preliminary result)   Collection Time: 04/29/21  4:52 AM   Specimen: BLOOD LEFT WRIST  Result Value Ref Range Status   Specimen Description BLOOD LEFT WRIST  Final   Special Requests   Final    BOTTLES DRAWN AEROBIC ONLY Blood Culture results may not be optimal due to an inadequate volume of blood received in culture bottles   Culture   Final    NO GROWTH 1 DAY Performed at [facility], [address] [place], [place] [address]    Report Status  PENDING  Incomplete  Blood culture (routine x 2)     Status: None (Preliminary result)   Collection Time: 04-16-21  6:03 AM   Specimen: BLOOD  Result Value Ref Range Status   Specimen Description BLOOD SITE NOT SPECIFIED  Final   Special Requests   Final    BOTTLES DRAWN AEROBIC ONLY Blood Culture results may not be optimal due to an inadequate volume of blood received in culture bottles   Culture   Final    NO GROWTH 1 DAY Performed at [facility], [address] [place], [place] [address]    Report Status PENDING  Incomplete  Resp Panel by RT-PCR (Flu A&B, Covid) Nasopharyngeal Swab     Status: None   Collection Time: 04/16/2021  6:41 AM   Specimen: Nasopharyngeal Swab; Nasopharyngeal(NP) swabs in vial transport medium  Result Value Ref Range Status   SARS Coronavirus 2 by RT PCR NEGATIVE NEGATIVE Final    Comment: (NOTE) SARS-CoV-2 target nucleic acids are NOT DETECTED.  The SARS-CoV-2 RNA is generally detectable in upper respiratory specimens during the acute phase of infection. The lowest concentration of SARS-CoV-2 viral copies this assay can detect is 138 copies/mL. A negative result does not preclude SARS-Cov-2 infection and should not be used as the sole basis for treatment or other patient management decisions. A negative result may occur with  improper specimen collection/handling, submission of specimen other than nasopharyngeal swab, presence of viral mutation(s) within the areas targeted by this assay, and inadequate number of viral copies(<138 copies/mL). A negative result must be combined with clinical observations, patient history, and epidemiological information. The expected result is Negative.  Fact Sheet for Patients:  BloggerCourse.com  Fact Sheet for Healthcare Providers:  SeriousBroker.it  This test is no t yet approved or cleared by the [place] FDA and  has been authorized for detection  and/or diagnosis of SARS-CoV-2 by FDA under an Emergency Use Authorization (EUA). This EUA will remain  in effect (meaning this test can be used) for the duration of the COVID-19 declaration under Section 564(b)(1) of the Act, 21 U.S.C.section 360bbb-3(b)(1), unless the authorization is terminated  or revoked sooner.       Influenza A by PCR NEGATIVE NEGATIVE Final   Influenza B by PCR NEGATIVE NEGATIVE Final    Comment: (NOTE) The Xpert Xpress SARS-CoV-2/FLU/RSV plus assay is intended as an aid in the diagnosis of influenza from Nasopharyngeal swab specimens and should not be used as a sole basis for treatment. Nasal washings and aspirates are unacceptable for Xpert Xpress SARS-CoV-2/FLU/RSV testing.  Fact Sheet for Patients: BloggerCourse.com  Fact Sheet for Healthcare Providers: SeriousBroker.it  This test is not yet approved or cleared by the [place] FDA and has been authorized for detection and/or diagnosis of SARS-CoV-2 by FDA under an Emergency Use Authorization (EUA). This EUA will remain in effect (meaning this test can be used) for the duration of the COVID-19 declaration under Section 564(b)(1) of the Act, 21 U.S.C. section 360bbb-3(b)(1), unless the authorization is terminated or revoked.  Performed at [facility], [address] [place], [place] [address]      Labs: Basic Metabolic Panel: Recent Labs  Lab 04-16-21 0452  NA 138  K 4.8  CL 106  CO2 24  GLUCOSE 97  BUN 21  CREATININE 1.20  CALCIUM 8.8*   Liver Function Tests: No results for input(s): AST,  ALT, ALKPHOS, BILITOT, PROT, ALBUMIN in the last 168 hours. No results for input(s): LIPASE, AMYLASE in the last 168 hours. No results for input(s): AMMONIA in the last 168 hours. CBC: Recent Labs  Lab 04/15/21 0452  WBC 9.2  NEUTROABS 7.5  HGB 13.4  HCT 43.8  MCV 96.5  PLT 165   Cardiac Enzymes: No results for input(s):  CKTOTAL, CKMB, CKMBINDEX, TROPONINI in the last 168 hours. D-Dimer No results for input(s): DDIMER in the last 72 hours. BNP: Invalid input(s): POCBNP CBG: No results for input(s): GLUCAP in the last 168 hours. Anemia work up No results for input(s): VITAMINB12, FOLATE, FERRITIN, TIBC, IRON, RETICCTPCT in the last 72 hours. Urinalysis    Component Value Date/Time   COLORURINE YELLOW 09/11/2020 1545   APPEARANCEUR HAZY (A) 09/11/2020 1545   LABSPEC 1.008 09/11/2020 1545   PHURINE 7.0 09/11/2020 1545   GLUCOSEU NEGATIVE 09/11/2020 1545   HGBUR SMALL (A) 09/11/2020 1545   BILIRUBINUR NEGATIVE 09/11/2020 1545   KETONESUR NEGATIVE 09/11/2020 1545   PROTEINUR NEGATIVE 09/11/2020 1545   NITRITE POSITIVE (A) 09/11/2020 1545   LEUKOCYTESUR LARGE (A) 09/11/2020 1545   Sepsis Labs Invalid input(s): PROCALCITONIN,  WBC,  LACTICIDVEN     SIGNED:  Clydie Braun, MD  [facility] 04/15/2021, 11:04 PM Pager   If 7PM-7AM, please contact night-coverage [email or web address] Password [email or web address]

## 2021-05-02 NOTE — ED Notes (Signed)
Report called  

## 2021-05-02 NOTE — H&P (Signed)
History and Physical    Craig Giles EXH:[id number] DOB: [birth year] DOA: May 05, 2021  Referring MD/NP/PA: Drema Pry, MD PCP: [facility]  Patient coming from: [facility] via [facility]  Chief Complaint: Respiratory distress  I have personally briefly reviewed patient's old medical records in [facility]   HPI: Craig Giles is a 87 y.o. male with medical history significant of Parkinson's disease with dementia, aspiration pneumonia, hypothyroidism, and severe kyphosis presents after being found in respiratory distress.  History is obtained mostly from review of records and talks with the patient's son over the phone as the patient has severe dementia and is not aware of where he is and what is going on most of the time.  Upon EMS arrival his oxygenation was reported to be in the 40s and heart rates into the 170 for which she was placed on a nonrebreather at 15 L with slow improvement of O2 saturations up to 99%.  He had just been seen at [facility] for "breathing problems" yesterday morning, but O2 saturations were seen to be maintained room air and he was ultimately discharged back to the facility.  Patient son notes that his father has been placed on hospice and has a DO NOT RESUSCITATE order in place.  They had stated that they were going to try and change him to a modified diet because they felt that he was aspirating prior, but to his knowledge he thinks that his status still been on a regular diet at the facility.  He states that at the facility where he lives the patient's father's breathing is often an issue for which they will send him to the emergency department and he is usually just sent back to the facility.  Son acknowledges that he would not like for the patient to keep going back and forth to the hospital at would like to just keep him comfortable and let nature take its course.  Discussed with the son about having palliative care talk to him about everything to  ensure that that is where he would like to proceed.  ED Course: Upon admission into the emergency department patient was noted to be afebrile with respirations into 24, blood pressures maintained, and O2 saturations as low as 50s with improvement on nonrebreather.  Labs are relatively unremarkable including lactic acid.  Chest x-ray gave concern for streaky bibasilar atelectasis.  It was thought that the patient was likely aspirated as a cause of his symptoms.  Blood cultures have been obtained and the patient has been started on empiric antibiotics of Zosyn.  [facility] called to admit.  Review of Systems  Unable to perform ROS: Mental status change    Past Medical History:  Diagnosis Date  . Hypothyroidism   . Memory deficits 10/22/2013  . Parkinson disease (HCC)   . Pneumonia    "this is his 2nd time" (12/31/2017)  . 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 CATARACT EXTRACTION W/ INTRAOCULAR LENS  IMPLANT, BILATERAL Bilateral   . TONSILLECTOMY       reports that he has never smoked. He has never used smokeless tobacco. He reports that he does not drink alcohol and does not use drugs.  No Known Allergies  Family History  Problem Relation Age of Onset  . Diabetes Brother     Prior to Admission medications   Medication Sig Start Date End Date Taking? Authorizing Provider  acetaminophen (TYLENOL) 325 MG tablet Take 650 mg  by mouth every 6 (six) hours as needed for mild pain, moderate pain, fever or headache.     [provider]  carbidopa-levodopa (SINEMET IR) 25-100 MG tablet Take 1 tablet by mouth 3 (three) times daily. 12/30/16   York SpanielWillis, Charles K, MD  Elastic Bandages & Supports (MEDICAL COMPRESSION STOCKINGS) MISC 1 Package by Other route daily. Apply in the morning and take off in the afternoon    [provider]  finasteride (PROSCAR) 5 MG tablet Take 5 mg by mouth daily. 12/17/17   [provider]   levothyroxine (SYNTHROID) 100 MCG tablet Take 100 mcg by mouth daily before breakfast.    [provider]  loratadine (CLARITIN) 10 MG tablet Take 10 mg by mouth daily as needed for allergies.    [provider]  Melatonin 10 MG CAPS Take 10 mg by mouth at bedtime.    [provider]  methocarbamol (ROBAXIN) 500 MG tablet Take 500 mg by mouth at bedtime.    [provider]  polyethylene glycol (MIRALAX / GLYCOLAX) packet Take 17 g by mouth daily.    [provider]  ropinirole (REQUIP) 5 MG tablet Take 5 mg by mouth daily.    [provider]  saccharomyces boulardii (FLORASTOR) 250 MG capsule Take 250 mg by mouth 2 (two) times daily.    [provider]  tamsulosin (FLOMAX) 0.4 MG CAPS capsule Take 0.4 mg by mouth daily. 12/24/17   [provider]  traZODone (DESYREL) 100 MG tablet Take 1 tablet (100 mg total) by mouth at bedtime. 12/30/16   York SpanielWillis, Charles K, MD  traZODone (DESYREL) 50 MG tablet Take 50 mg by mouth at bedtime.    [provider]    Physical Exam:  Constitutional: Elderly male who appears to be lethargic and not readily arousable at this time Vitals:   2021-04-18 0630 2021-04-18 0645 2021-04-18 0700 2021-04-18 0715  BP: 111/74 100/72 101/81 115/74  Pulse: (!) 58 60 (!) 36 63  Resp: 12 11 14 14   Temp:      TempSrc:      SpO2: 100% 100% 98% 100%   Eyes: PERRL, lids and conjunctivae normal ENMT: Mucous membranes are moist. Posterior pharynx clear of any exudate or lesions.   Neck: Significant forward flexion of the neck Respiratory: Normal respiratory effort at this time on a nonrebreather mask at 15 L with rhonchus breath sounds noted in the mid to lower lung fields.  No significant wheezing appreciated. Cardiovascular: Regular rate and rhythm, no murmurs / rubs / gallops.  Trace lower extremity edema. 2+ pedal pulses. No carotid bruits.  Abdomen: no tenderness, no masses palpated. No  hepatosplenomegaly. Bowel sounds positive.  Musculoskeletal: no clubbing / cyanosis.  Severe thoracic kyphosis Skin: no rashes, lesions, ulcers. No induration Neurologic: CN 2-12 grossly intact. Sensation intact, DTR normal. Strength 5/5 in all 4.  Psychiatric: Lethargic and unable to assess at this time as patient would not awaken or respond.    Labs on Admission: I have personally reviewed following labs and imaging studies  CBC: Recent Labs  Lab 2021-04-18 0452  WBC 9.2  NEUTROABS 7.5  HGB 13.4  HCT 43.8  MCV 96.5  PLT 165   Basic Metabolic Panel: Recent Labs  Lab 2021-04-18 0452  NA 138  K 4.8  CL 106  CO2 24  GLUCOSE 97  BUN 21  CREATININE 1.20  CALCIUM 8.8*   GFR: Estimated Creatinine Clearance: 39.4 mL/min (by C-G formula based on SCr of  1.2 mg/dL). Liver Function Tests: No results for input(s): AST, ALT, ALKPHOS, BILITOT, PROT, ALBUMIN in the last 168 hours. No results for input(s): LIPASE, AMYLASE in the last 168 hours. No results for input(s): AMMONIA in the last 168 hours. Coagulation Profile: No results for input(s): INR, PROTIME in the last 168 hours. Cardiac Enzymes: No results for input(s): CKTOTAL, CKMB, CKMBINDEX, TROPONINI in the last 168 hours. BNP (last 3 results) No results for input(s): PROBNP in the last 8760 hours. HbA1C: No results for input(s): HGBA1C in the last 72 hours. CBG: No results for input(s): GLUCAP in the last 168 hours. Lipid Profile: No results for input(s): CHOL, HDL, LDLCALC, TRIG, CHOLHDL, LDLDIRECT in the last 72 hours. Thyroid Function Tests: No results for input(s): TSH, T4TOTAL, FREET4, T3FREE, THYROIDAB in the last 72 hours. Anemia Panel: No results for input(s): VITAMINB12, FOLATE, FERRITIN, TIBC, IRON, RETICCTPCT in the last 72 hours. Urine analysis:    Component Value Date/Time   COLORURINE YELLOW 09/11/2020 1545   APPEARANCEUR HAZY (A) 09/11/2020 1545   LABSPEC 1.008 09/11/2020 1545   PHURINE 7.0 09/11/2020  1545   GLUCOSEU NEGATIVE 09/11/2020 1545   HGBUR SMALL (A) 09/11/2020 1545   BILIRUBINUR NEGATIVE 09/11/2020 1545   KETONESUR NEGATIVE 09/11/2020 1545   PROTEINUR NEGATIVE 09/11/2020 1545   NITRITE POSITIVE (A) 09/11/2020 1545   LEUKOCYTESUR LARGE (A) 09/11/2020 1545   Sepsis Labs: No results found for this or any previous visit (from the past 240 hour(s)).   Radiological Exams on Admission: DG Chest Port 1 View  Result Date: May 08, 2021 CLINICAL DATA:  Mental status change.  Oxygen in the 40s. EXAM: PORTABLE CHEST 1 VIEW.  Patient is rotated and angulated forward. COMPARISON:  Chest x-ray 04/13/2021 FINDINGS: Enlarged cardiac silhouette. The heart size and mediastinal contours are unchanged. Markedly limited evaluation due to overlying mandible. Bibasilar streaky airspace opacities. Lingular linear atelectasis. No focal consolidation. No pulmonary edema. Possible trace right pleural effusion. No pneumothorax. No acute osseous abnormality. IMPRESSION: 1. Bibasilar streaky airspace opacities. 2. Possible interval development of a trace right pleural effusion. 3. Persistently enlarged cardiac silhouette. 4. Markedly limited evaluation due to overlying mandible. Electronically Signed   By: Tish Frederickson M.D.   On: 05-08-2021 02:48   DG Chest Port 1 View  Result Date: 04/13/2021 CLINICAL DATA:  Shortness of breath EXAM: PORTABLE CHEST 1 VIEW COMPARISON:  02/28/2021 FINDINGS: New very limited study due to the degree of kyphosis and head overlapping the chest. Scarring seen on the left. No air bronchogram, Kerley lines, visible effusion, or gross air leak. Cardiomegaly. IMPRESSION: 1. Very limited due to the degree of kyphosis. 2. Pulmonary scarring without acute finding. Electronically Signed   By: Marnee Spring M.D.   On: 04/13/2021 04:27    EKG: Independently reviewed.  Sinus rhythm at 72 bpm  Assessment/Plan  Acute respiratory failure with hypoxia Aspiration pneumonia Dysphagia Thoracic  kyphosis Acute metabolic encephalopathy Parkinson's dementia Congestive heart failure Hypothyroidism DNR/DNI Comfort measures only Craig Giles presented after being found to have labored breathing with O2 saturations reportedly in 40s with improvement after being placed on nonrebreather mask at 15 L.  Labs are relatively unremarkable.  Chest x-ray gave concern for streaky bibasilar airspace opacities, persistently enlarged cardiac silhouette, and development of interval trace pleural effusion.  Suspect patient's acute respiratory failure with hypoxia is likely related with aspiration pneumonia and/or aspect of congestive heart failure.  On physical exam he does have severe thoracic kyphosis rhonchi in the mid to lower lung fields  and is currently not really arousable or able to follow commands.  Suspect this would likely not safe for the patient to try and swallow at all given the severity of his kyphosis and Parkinson's dementia to be able to follow commands.  Discussed this with the patient's son understands that this could be related with further progression in his disease.  Patient is on acknowledges this and would like to just keep his father comfortable at this point in time and let nature run its course.  He would like him ultimately moved to a facility where this could take place better than where he currently is receiving hospice at [facility]. -Admit to a medical bed -Speech therapy consulted to see if patient would possibly qualify for modified diet, but currently patient's not arousable -Palliative care medicine consulted and notified at [facility], RN of the patient needing bed at [facility] -Discontinue cardiac monitoring -Routine vital sign checks -Discontinued home medications -N.p.o. unless okayed by speech Molli Knock for RN to pronounce death -Aspiration precautions -Maintain IV access -Continuous nasal cannula oxygen as needed for comfort -morphine IV prn pain or  dyspnea -Zofran IV prn nausea/vomiting -Ativan IV prn anxiety -Haloperidol IV prn agitation or delirium -Glycopyrrolate prn excessive secretions -Albuterol prn wheezing   -Will follow-up back up with palliative care for any other additional recommendations   DVT prophylaxis: None Code Status: DNR/DNI Family Communication: Son updated over the phone. Disposition Plan: TBD time Consults called: None Admission status: Inpatient, require more than 2 midnight stay  Clydie Braun MD [facility]   If 7PM-7AM, please contact night-coverage   04-23-2021, 7:52 AM

## 2021-05-02 NOTE — ED Notes (Signed)
Resting at this time.  No distress noted.  Repositioned for breathing and comfort

## 2021-05-02 NOTE — Progress Notes (Signed)
[facility] ED [address] [facility]) [facility] [place] Liaison Note   Craig Giles is a current hospice patient with [facility] admitted on 03/09/21 with a terminal diagnosis of Parkinson's Disease.  He has been experiencing labored breathing with hypoxia, worse at night, and was sent to [facility] ED for evaluation yesterday.  Pt was assessed by his RN case manager on Friday with no acute distress noted.  Per notes pt was sent to [facility] ED Friday night for acute respiratory distress with O2 saturations reports in the 40's% RA, improved on a NRB.  Pt was admitted with acute hypoxic respiratory distress and made full comfort care after discussion with his son Craig Giles.  Per Dr. Jamie Brookes, [facility] MD, this is a related admission.    Visited with patient at bedside.  Respirations noisy, wet quality noted. Pt alert to self only, c/o pain.  Report exchanged with bedside RN Velta Addison and PRN meds reviewed.  RN administered robinul and morphine to address pain and wet breath sounds.  This RN spoke by phone with pt's son Craig Giles who verbalized understanding of comfort care measures and voiced desire for pt to transfer to [facility] when appropriate.  Craig Giles also asked if pt could be fed if hungry; this RN reviewed likely risk of aspiration, and Craig Giles was clear in his desire for pt to be allowed comfort feeds understanding very real risk of aspiration.  Dr. Katrinka Blazing updated; new order for comfort feeds placed.  Pt reassessed 3 hours after PRN meds administered.  Pt more alert, still oriented to self only,  RR 18 with continued wet quality, SpO2 99%RA (pt had removed nasal cannula), denies pain.  Pt asked repeatedly for something to drink.  This RN helped pt drink Sprite with a straw in small sips, then fed pt 40 mL applesauce.  No overt s/s of  aspiration noted.  Respiration quality continued to be wet.  Report again exchanged with bedside RN. No new needs at this time.  Patient is appropriate for inpatient services due to the need to  establish IV medication regime to address dyspnea and pain at end of life.   Vital Signs: 99.1 oral,  124 HR, 111/68, 19 RR,  95% RA   I&O: not charted   Abnormal Labs: Ca 8.8, GFR 58 Diagnostics:  DG Chest Port 1 View   Result Date: 10-May-2021 CLINICAL DATA:  Mental status change.  Oxygen in the 40s. EXAM: PORTABLE CHEST 1 VIEW.  Patient is rotated and angulated forward. COMPARISON:  Chest x-ray 04/13/2021 FINDINGS: Enlarged cardiac silhouette. The heart size and mediastinal contours are unchanged. Markedly limited evaluation due to overlying mandible. Bibasilar streaky airspace opacities. Lingular linear atelectasis. No focal consolidation. No pulmonary edema. Possible trace right pleural effusion. No pneumothorax. No acute osseous abnormality. IMPRESSION: 1. Bibasilar streaky airspace opacities. 2. Possible interval development of a trace right pleural effusion. 3. Persistently enlarged cardiac silhouette. 4. Markedly limited evaluation due to overlying mandible. Electronically Signed   By: Tish Frederickson M.D.   On: 05-10-2021 02:48    DG Chest Port 1 View   Result Date: 04/13/2021 CLINICAL DATA:  Shortness of breath EXAM: PORTABLE CHEST 1 VIEW COMPARISON:  02/28/2021 FINDINGS: New very limited study due to the degree of kyphosis and head overlapping the chest. Scarring seen on the left. No air bronchogram, Kerley lines, visible effusion, or gross air leak. Cardiomegaly. IMPRESSION: 1. Very limited due to the degree of kyphosis. 2. Pulmonary scarring without acute finding. Electronically Signed   By: Marja Kays  Watts M.D.   On: 04/13/2021 04:27      IV/PRN's:  Zosyn 3.375g PIV x 1 dose, robuinul 0.2 mg PIV q4h PRN excessive secretions x 1 dose, morphine 1 mg q2h PRN pain/SOB x 1 dose   Problem List- Acute respiratory failure with hypoxia Aspiration pneumonia Dysphagia Thoracic kyphosis Acute metabolic encephalopathy Parkinson's dementia Congestive heart  failure Hypothyroidism DNR/DNI Comfort measures only [name] presented after being found to have labored breathing with O2 saturations reportedly in 40s with improvement after being placed on nonrebreather mask at 15 L.  Labs are relatively unremarkable.  Chest x-ray gave concern for streaky bibasilar airspace opacities, persistently enlarged cardiac silhouette, and development of interval trace pleural effusion.  Suspect patient's acute respiratory failure with hypoxia is likely related with aspiration pneumonia and/or aspect of congestive heart failure.  On physical exam he does have severe thoracic kyphosis rhonchi in the mid to lower lung fields and is currently not really arousable or able to follow commands.  Suspect this would likely not safe for the patient to try and swallow at all given the severity of his kyphosis and Parkinson's dementia to be able to follow commands.  Discussed this with the patient's son understands that this could be related with further progression in his disease.  Patient is on acknowledges this and would like to just keep his father comfortable at this point in time and let nature run its course.  He would like him ultimately moved to a facility where this could take place better than where he currently is receiving hospice at [facility]. -Admit to a medical bed -Speech therapy consulted to see if patient would possibly qualify for modified diet, but currently patient's not arousable -Palliative care medicine consulted and notified at [facility], RN of the patient needing bed at [facility] -Discontinue cardiac monitoring -Routine vital sign checks -Discontinued home medications -N.p.o. unless okayed by speech- updated to comfort feeds -Okay for RN to pronounce death -Aspiration precautions -Maintain IV access -Continuous nasal cannula oxygen as needed for comfort -morphine IV prn pain or dyspnea -Zofran IV prn nausea/vomiting -Ativan IV prn  anxiety -Haloperidol IV prn agitation or delirium -Glycopyrrolate prn excessive secretions -Albuterol prn wheezing   -Will follow-up back up with palliative care for any other additional recommendations  Discharge Planning: [facility] if deemed eligible by [facility] MD and pending bed availability   Family Contact: son Craig Giles   IDT: updated   Goals of Care: Full Comfort Care   Should patient need ambulance transfer at discharge or transfer please use [facility] as they contract this service for our active Hospice patients.    Gillian Scarce, BSN, RN [facility] Liaison (listed on [facility] under Hospice[facility])    [phone] (24h on call)

## 2021-05-02 DEATH — deceased
